# Patient Record
Sex: Female | Born: 1955 | Race: White | Hispanic: No | Marital: Single | Smoking: Current every day smoker
Health system: Southern US, Community
[De-identification: ages and names within clinical notes are randomized; demographics above are authoritative.]

## PROBLEM LIST (undated history)

## (undated) DIAGNOSIS — K7460 Unspecified cirrhosis of liver: Secondary | ICD-10-CM

## (undated) DIAGNOSIS — I10 Essential (primary) hypertension: Secondary | ICD-10-CM

## (undated) DIAGNOSIS — J219 Acute bronchiolitis, unspecified: Secondary | ICD-10-CM

## (undated) DIAGNOSIS — J449 Chronic obstructive pulmonary disease, unspecified: Secondary | ICD-10-CM

## (undated) DIAGNOSIS — K7030 Alcoholic cirrhosis of liver without ascites: Secondary | ICD-10-CM

## (undated) DIAGNOSIS — J45909 Unspecified asthma, uncomplicated: Secondary | ICD-10-CM

## (undated) HISTORY — DX: Chronic obstructive pulmonary disease, unspecified: J44.9

## (undated) HISTORY — PX: APPENDECTOMY: SHX54

## (undated) HISTORY — DX: Unspecified cirrhosis of liver: K74.60

## (undated) HISTORY — DX: Essential (primary) hypertension: I10

## (undated) HISTORY — PX: ABDOMINAL HYSTERECTOMY: SHX81

## (undated) HISTORY — PX: BACK SURGERY: SHX140

---

## 2001-05-21 ENCOUNTER — Encounter: Admission: RE | Admit: 2001-05-21 | Discharge: 2001-05-21 | Payer: Self-pay | Admitting: Family Medicine

## 2001-05-21 ENCOUNTER — Encounter: Payer: Self-pay | Admitting: Family Medicine

## 2004-02-13 ENCOUNTER — Emergency Department (HOSPITAL_COMMUNITY): Admission: EM | Admit: 2004-02-13 | Discharge: 2004-02-13 | Payer: Self-pay | Admitting: Emergency Medicine

## 2008-09-16 ENCOUNTER — Emergency Department (HOSPITAL_COMMUNITY): Admission: EM | Admit: 2008-09-16 | Discharge: 2008-09-16 | Payer: Self-pay | Admitting: Emergency Medicine

## 2009-11-28 ENCOUNTER — Emergency Department (HOSPITAL_COMMUNITY): Admission: EM | Admit: 2009-11-28 | Discharge: 2009-11-28 | Payer: Self-pay | Admitting: Emergency Medicine

## 2010-02-12 ENCOUNTER — Emergency Department (HOSPITAL_COMMUNITY): Admission: EM | Admit: 2010-02-12 | Discharge: 2010-02-12 | Payer: Self-pay | Admitting: Emergency Medicine

## 2010-12-29 LAB — RAPID STREP SCREEN (MED CTR MEBANE ONLY): Streptococcus, Group A Screen (Direct): NEGATIVE

## 2011-07-13 LAB — POCT I-STAT, CHEM 8
BUN: 14 mg/dL (ref 6–23)
Calcium, Ion: 1.11 mmol/L — ABNORMAL LOW (ref 1.12–1.32)
Chloride: 112 mEq/L (ref 96–112)
Creatinine, Ser: 0.9 mg/dL (ref 0.4–1.2)
Glucose, Bld: 103 mg/dL — ABNORMAL HIGH (ref 70–99)
HCT: 43 % (ref 36.0–46.0)
Hemoglobin: 14.6 g/dL (ref 12.0–15.0)
Potassium: 4 mEq/L (ref 3.5–5.1)
Sodium: 139 mEq/L (ref 135–145)
TCO2: 21 mmol/L (ref 0–100)

## 2011-07-13 LAB — POCT CARDIAC MARKERS
CKMB, poc: 1.1 ng/mL (ref 1.0–8.0)
Myoglobin, poc: 45.2 ng/mL (ref 12–200)
Troponin i, poc: <0.05 ng/mL (ref 0.00–0.09)

## 2013-12-30 ENCOUNTER — Emergency Department (HOSPITAL_COMMUNITY): Payer: Self-pay

## 2013-12-30 ENCOUNTER — Encounter (HOSPITAL_COMMUNITY): Payer: Self-pay | Admitting: Emergency Medicine

## 2013-12-30 ENCOUNTER — Inpatient Hospital Stay (HOSPITAL_COMMUNITY)
Admission: EM | Admit: 2013-12-30 | Discharge: 2014-01-02 | DRG: 202 | Disposition: A | Discharge status: Home / Self Care | Payer: Self-pay | Attending: Internal Medicine | Admitting: Internal Medicine

## 2013-12-30 DIAGNOSIS — Z720 Tobacco use: Secondary | ICD-10-CM

## 2013-12-30 DIAGNOSIS — F17200 Nicotine dependence, unspecified, uncomplicated: Secondary | ICD-10-CM | POA: Diagnosis present

## 2013-12-30 DIAGNOSIS — Z880 Allergy status to penicillin: Secondary | ICD-10-CM

## 2013-12-30 DIAGNOSIS — F17210 Nicotine dependence, cigarettes, uncomplicated: Secondary | ICD-10-CM | POA: Diagnosis present

## 2013-12-30 DIAGNOSIS — J45901 Unspecified asthma with (acute) exacerbation: Principal | ICD-10-CM | POA: Diagnosis present

## 2013-12-30 DIAGNOSIS — J209 Acute bronchitis, unspecified: Secondary | ICD-10-CM | POA: Diagnosis present

## 2013-12-30 DIAGNOSIS — J9601 Acute respiratory failure with hypoxia: Secondary | ICD-10-CM | POA: Diagnosis present

## 2013-12-30 DIAGNOSIS — J40 Bronchitis, not specified as acute or chronic: Secondary | ICD-10-CM

## 2013-12-30 DIAGNOSIS — J9600 Acute respiratory failure, unspecified whether with hypoxia or hypercapnia: Secondary | ICD-10-CM | POA: Diagnosis present

## 2013-12-30 DIAGNOSIS — J441 Chronic obstructive pulmonary disease with (acute) exacerbation: Secondary | ICD-10-CM

## 2013-12-30 HISTORY — DX: Unspecified asthma, uncomplicated: J45.909

## 2013-12-30 HISTORY — DX: Acute bronchiolitis, unspecified: J21.9

## 2013-12-30 LAB — BLOOD GAS, ARTERIAL
Acid-base deficit: 1.3 mmol/L (ref 0.0–2.0)
Bicarbonate: 22.4 mEq/L (ref 20.0–24.0)
DRAWN BY: 235321
O2 CONTENT: 2 L/min
O2 Saturation: 92.5 %
PCO2 ART: 36.4 mmHg (ref 35.0–45.0)
PH ART: 7.405 (ref 7.350–7.450)
Patient temperature: 98.6
TCO2: 19.6 mmol/L (ref 0–100)
pO2, Arterial: 65.3 mmHg — ABNORMAL LOW (ref 80.0–100.0)

## 2013-12-30 LAB — BASIC METABOLIC PANEL
BUN: 13 mg/dL (ref 6–23)
CO2: 24 meq/L (ref 19–32)
CREATININE: 0.67 mg/dL (ref 0.50–1.10)
Calcium: 9.1 mg/dL (ref 8.4–10.5)
Chloride: 103 mEq/L (ref 96–112)
GFR calc Af Amer: >90 mL/min (ref >90)
GFR calc non Af Amer: >90 mL/min (ref >90)
Glucose, Bld: 136 mg/dL — ABNORMAL HIGH (ref 70–99)
Potassium: 3.9 mEq/L (ref 3.7–5.3)
SODIUM: 138 meq/L (ref 137–147)

## 2013-12-30 LAB — I-STAT TROPONIN, ED: Troponin i, poc: 0 ng/mL (ref 0.00–0.08)

## 2013-12-30 LAB — CBC
HEMATOCRIT: 41.1 % (ref 36.0–46.0)
HEMOGLOBIN: 14.1 g/dL (ref 12.0–15.0)
MCH: 29.6 pg (ref 26.0–34.0)
MCHC: 34.3 g/dL (ref 30.0–36.0)
MCV: 86.2 fL (ref 78.0–100.0)
Platelets: 297 10*3/uL (ref 150–400)
RBC: 4.77 MIL/uL (ref 3.87–5.11)
RDW: 13.1 % (ref 11.5–15.5)
WBC: 8.6 10*3/uL (ref 4.0–10.5)

## 2013-12-30 LAB — D-DIMER, QUANTITATIVE (NOT AT ARMC)

## 2013-12-30 MED ORDER — ALBUTEROL SULFATE (2.5 MG/3ML) 0.083% IN NEBU
5.0000 mg | INHALATION_SOLUTION | Freq: Once | RESPIRATORY_TRACT | Status: AC
  Administered 2013-12-30: 5 mg via RESPIRATORY_TRACT
  Filled 2013-12-30: qty 6

## 2013-12-30 MED ORDER — GUAIFENESIN ER 600 MG PO TB12
600.0000 mg | ORAL_TABLET | Freq: Two times a day (BID) | ORAL | Status: DC
  Administered 2013-12-31 – 2014-01-02 (×6): 600 mg via ORAL
  Filled 2013-12-30 (×9): qty 1

## 2013-12-30 MED ORDER — ALBUTEROL SULFATE (2.5 MG/3ML) 0.083% IN NEBU: 5.0000 mg | INHALATION_SOLUTION | RESPIRATORY_TRACT | Status: DC | PRN

## 2013-12-30 MED ORDER — ALBUTEROL SULFATE (2.5 MG/3ML) 0.083% IN NEBU: 2.5000 mg | INHALATION_SOLUTION | RESPIRATORY_TRACT | Status: DC

## 2013-12-30 MED ORDER — PREDNISONE 20 MG PO TABS
60.0000 mg | ORAL_TABLET | Freq: Once | ORAL | Status: AC
  Administered 2013-12-30: 60 mg via ORAL
  Filled 2013-12-30: qty 3

## 2013-12-30 MED ORDER — METHYLPREDNISOLONE SODIUM SUCC 125 MG IJ SOLR
60.0000 mg | Freq: Two times a day (BID) | INTRAMUSCULAR | Status: DC
  Administered 2013-12-31 – 2014-01-01 (×5): 60 mg via INTRAVENOUS
  Filled 2013-12-30 (×7): qty 0.96

## 2013-12-30 MED ORDER — DEXTROSE 5 % IV SOLN
500.0000 mg | Freq: Every day | INTRAVENOUS | Status: DC
  Administered 2013-12-31 – 2014-01-01 (×3): 500 mg via INTRAVENOUS
  Filled 2013-12-30 (×3): qty 500

## 2013-12-30 MED ORDER — ACETAMINOPHEN 325 MG PO TABS: 650.0000 mg | ORAL_TABLET | Freq: Four times a day (QID) | ORAL | Status: DC | PRN

## 2013-12-30 MED ORDER — IPRATROPIUM BROMIDE 0.02 % IN SOLN: 0.5000 mg | RESPIRATORY_TRACT | Status: DC

## 2013-12-30 MED ORDER — IPRATROPIUM-ALBUTEROL 0.5-2.5 (3) MG/3ML IN SOLN
RESPIRATORY_TRACT | Status: AC
  Filled 2013-12-30: qty 3

## 2013-12-30 MED ORDER — DOXYCYCLINE HYCLATE 100 MG PO TABS
100.0000 mg | ORAL_TABLET | Freq: Once | ORAL | Status: AC
  Administered 2013-12-30: 100 mg via ORAL
  Filled 2013-12-30: qty 1

## 2013-12-30 MED ORDER — DEXTROSE 5 % IV SOLN
1.0000 g | Freq: Every day | INTRAVENOUS | Status: DC
  Administered 2013-12-31 – 2014-01-01 (×3): 1 g via INTRAVENOUS
  Filled 2013-12-30 (×3): qty 10

## 2013-12-30 MED ORDER — ACETAMINOPHEN 650 MG RE SUPP: 650.0000 mg | Freq: Four times a day (QID) | RECTAL | Status: DC | PRN

## 2013-12-30 MED ORDER — ONDANSETRON HCL 4 MG PO TABS: 4.0000 mg | ORAL_TABLET | Freq: Four times a day (QID) | ORAL | Status: DC | PRN

## 2013-12-30 MED ORDER — ONDANSETRON HCL 4 MG/2ML IJ SOLN: 4.0000 mg | Freq: Four times a day (QID) | INTRAMUSCULAR | Status: DC | PRN

## 2013-12-30 MED ORDER — ENOXAPARIN SODIUM 40 MG/0.4ML ~~LOC~~ SOLN
40.0000 mg | Freq: Every day | SUBCUTANEOUS | Status: DC
  Administered 2013-12-31 – 2014-01-01 (×3): 40 mg via SUBCUTANEOUS
  Filled 2013-12-30 (×4): qty 0.4

## 2013-12-30 NOTE — Progress Notes (Signed)
   CARE MANAGEMENT ED NOTE 12/30/2013  Patient:  Angel [name],Angel [name]   Account Number:  [id number]  Date Initiated:  12/30/2013  Documentation initiated by:  Radford PaxFERRERO,Anadalay Macdonell  Subjective/Objective Assessment:   Patient presents to Ed with cough and shortness of breath.     Subjective/Objective Assessment Detail:   Patient has history of asthma.     Action/Plan:   Action/Plan Detail:   Anticipated DC Date:       Status Recommendation to Physician:   Result of Recommendation:    Other ED Services  Consult Working Plan    DC Planning Services  Other  PCP issues    Choice offered to / List presented to:            Status of service:  Completed, signed off  ED Comments:   ED Comments Detail:  EDCM spoke to patient at bedside.  Patient reports she would consider her pcp to be the [facility] on [place]. Patient reports she goes there when she needs too.  Patient reports she does not have insurance.  [facility] rep saw patient earlier today and provided patient with information regarding the orange card.  [facility] provided patient with list of pcps who accept self pay patients, phone number to inquire about Medicaid insurance, list of fiancial resources in the community such as local churches and [employer], urban ministries, dental assistance for uninsured patients, discounted pharmacies and websites needymeds.org anf Good https://figueroa.info/X.com for medication assistance. [facility] provided patient with a RX discount card, and coupon for venolin inhaler from good https://figueroa.info/X.com.  Patient reports she has difficulty affording the ventolin inhaler.  [facility] encouraged patient to quit smoking.  Patient reports hse has been trying too.  [facility]  Patient thankful for resources.  No further EDCM needs at this time.

## 2013-12-30 NOTE — ED Notes (Signed)
Called to give report to 3rd floor, nurse states she will call back.

## 2013-12-30 NOTE — ED Notes (Signed)
After 3 breathing treatments, pt continues to have expiratory wheezing. O2 still around 90-91% on 2 L [place]

## 2013-12-30 NOTE — ED Notes (Signed)
Bed: WA07 Expected date:  Expected time:  Means of arrival:  Comments: 

## 2013-12-30 NOTE — H&P (Signed)
[facility] History and Physical  Patient: Angel [name]  RUE:[id number]  DOB: 02/21/1956  DOS: the patient was seen and examined on 12/30/2013 PCP: No primary provider on file.  Chief Complaint: Shortness of breath  HPI: Angel [name] is a 58 y.o. female with Past medical history of asthma. The patient is coming from home. The patient presented with complaints of shortness of breath that has been progressively worsening since last one and a half week. This is associated with cough since last few days with clear white sputum without any blood. She denies any fever but had some chills. She started having some chest tightness today. Her shortness of breath was progressively worsened to the extent that she was short of breath even at rest today therefore she came to the hospital. She has been actively smoking few cigarettes a daily basis. Denies any significant alcohol abuse. She mentions she has history of asthma and has inhaler and she has been out of them few days ago. She has history of penicillin allergy but has tolerated Keflex and azithromycin.  Review of Systems: as mentioned in the history of present illness.  A Comprehensive review of the other systems is negative.  Past Medical History  Diagnosis Date  . Asthma   . Bronchiolitis    Past Surgical History  Procedure Laterality Date  . Abdominal hysterectomy    . Appendectomy     Social History:  reports that she has been smoking.  She does not have any smokeless tobacco history on file. She reports that she does not drink alcohol or use illicit drugs. Independent for most of her  ADL.  Allergies  Allergen Reactions  . Penicillins Hives    No family history on file.  Prior to Admission medications   Medication Sig Start Date End Date Taking? Authorizing Provider  albuterol (PROVENTIL HFA;VENTOLIN HFA) 108 (90 BASE) MCG/ACT inhaler Inhale 1-2 puffs into the lungs every 6 (six) hours as needed for wheezing or  shortness of breath.   Yes Historical Provider, MD    Physical Exam: Filed Vitals:   12/30/13 1635 12/30/13 1707 12/30/13 1735 12/30/13 1854  BP:    106/66  Pulse:  96 85   Temp:      TempSrc:      Resp:   16 18  SpO2: 90% 88% 92% 92%    General: Alert, Awake and Oriented to Time, Place and Person. Appear in mild distress Eyes: PERRL ENT: Oral Mucosa clear moist. Neck: no JVD Cardiovascular: S1 and S2 Present, no Murmur, Peripheral Pulses Present Respiratory: Bilateral Air entry equal and Decreased, no Crackles, extensive expiratory wheezes Abdomen: Bowel Sound Present, Soft and Non tender Skin: No Rash Extremities: No Pedal edema, no calf tenderness Neurologic: Grossly Unremarkable.  Labs on Admission:  CBC:  Recent Labs Lab 12/30/13 1315  WBC 8.6  HGB 14.1  HCT 41.1  MCV 86.2  PLT 297    CMP     Component Value Date/Time   NA 138 12/30/2013 1315   K 3.9 12/30/2013 1315   CL 103 12/30/2013 1315   CO2 24 12/30/2013 1315   GLUCOSE 136* 12/30/2013 1315   BUN 13 12/30/2013 1315   CREATININE 0.67 12/30/2013 1315   CALCIUM 9.1 12/30/2013 1315   GFRNONAA >90 12/30/2013 1315   GFRAA >90 12/30/2013 1315    Radiological Exams on Admission: Dg Chest Port 1 View  12/30/2013   CLINICAL DATA:  Shortness of breath, asthma  EXAM: PORTABLE CHEST - 1  VIEW  COMPARISON:  11/28/2009  FINDINGS: Cardiomediastinal silhouette is stable. No acute infiltrate or pleural effusion. No pulmonary edema. Bony thorax is unremarkable.  IMPRESSION: No active disease.   Electronically Signed   By: Natasha MeadLiviu  Pop M.D.   On: 12/30/2013 13:32    EKG: Independently reviewed. normal sinus rhythm.  Assessment/Plan Principal Problem:   Asthma exacerbation   1. Asthma exacerbation The patient is presenting with complaints of shortness of breath is progressively worsening associated with cough. She has extensive expiratory wheezing in the hospital and at the time of my evaluation and requiring 2 L of oxygen  to maintain adequate saturation. With this she will be admitted to the hospital for asthma exacerbation. She'll be started on IV steroids Solu-Medrol 60 mg every 12 hours, IV ceftriaxone and IV azithromycin, duo nebs every 4 hours and when necessary. I will obtain sputum culture and influenza PCR Mostly she has a rescue inhaler, may need regular scheduled asthma treatment at the time of discharge  2. History of smoking Along with asthma patient may also have COPD. Need workup as an outpatient and establish with a physician for primary care.  DVT Prophylaxis: subcutaneous Heparin Nutrition: Regular diet  Code Status: Full  Disposition: Admitted to inpatient in med-surge unit.  Author: Lynden OxfordPranav Traven Davids, MD [facility] Pager: [phone] 12/30/2013, 8:18 PM    If 7PM-7AM, please contact night-coverage [email or web address] Password [email or web address]

## 2013-12-30 NOTE — ED Provider Notes (Signed)
CSN: [id number]     Arrival date & time 12/30/13  1213 History   First MD Initiated Contact with Patient 12/30/13 1259     Chief Complaint  Patient presents with  . Asthma  . chest congestion   . Shortness of Breath     (Consider location/radiation/quality/duration/timing/severity/associated sxs/prior Treatment) HPI Comments: Patient presents with cough and shortness of breath. She states the symptoms started about a week and half ago but it got worse over the last 5 days. She does have a history of asthma and feels like her wheezing and shortness of breath has worsened over the last 2-3 days. She ran out of her albuterol inhaler yesterday. She denies a known fevers. She is sore across the chest The right thigh. It's worse with coughing and worse with breathing. She denies any leg pain or swelling. There is no history of DVTs. She does smoke daily.  Patient is a 58 y.o. female presenting with asthma and shortness of breath.  Asthma Associated symptoms include chest pain and shortness of breath. Pertinent negatives include no abdominal pain and no headaches.  Shortness of Breath Associated symptoms: chest pain, cough and wheezing   Associated symptoms: no abdominal pain, no diaphoresis, no fever, no headaches, no rash and no vomiting     Past Medical History  Diagnosis Date  . Asthma   . Bronchiolitis    Past Surgical History  Procedure Laterality Date  . Abdominal hysterectomy    . Appendectomy     No family history on file. History  Substance Use Topics  . Smoking status: Current Every Day Smoker  . Smokeless tobacco: Not on file  . Alcohol Use: No   OB History   Grav Para Term Preterm Abortions TAB SAB Ect Mult Living                 Review of Systems  Constitutional: Positive for fatigue. Negative for fever, chills and diaphoresis.  HENT: Negative for congestion, rhinorrhea and sneezing.   Eyes: Negative.   Respiratory: Positive for cough, shortness of breath and  wheezing. Negative for chest tightness.   Cardiovascular: Positive for chest pain. Negative for leg swelling.  Gastrointestinal: Negative for nausea, vomiting, abdominal pain, diarrhea and blood in stool.  Genitourinary: Negative for frequency, hematuria, flank pain and difficulty urinating.  Musculoskeletal: Negative for arthralgias and back pain.  Skin: Negative for rash.  Neurological: Negative for dizziness, speech difficulty, weakness, numbness and headaches.      Allergies  Penicillins  Home Medications   Current Outpatient Rx  Name  Route  Sig  Dispense  Refill  . albuterol (PROVENTIL HFA;VENTOLIN HFA) 108 (90 BASE) MCG/ACT inhaler   Inhalation   Inhale 1-2 puffs into the lungs every 6 (six) hours as needed for wheezing or shortness of breath.          BP 114/83  Pulse 80  Temp(Src) 98 F (36.7 C) (Oral)  Resp 25  SpO2 90% Physical Exam  Constitutional: She is oriented to person, place, and time. She appears well-developed and well-nourished. She appears distressed.  HENT:  Head: Normocephalic and atraumatic.  Eyes: Pupils are equal, round, and reactive to light.  Neck: Normal range of motion. Neck supple.  Cardiovascular: Normal rate, regular rhythm and normal heart sounds.   Pulmonary/Chest: She is in respiratory distress. She has wheezes. She has no rales. She exhibits no tenderness.  Patient has wheezing with diminished breath sounds bilaterally. She has increased work of breathing.  Abdominal: Soft.  Bowel sounds are normal. There is no tenderness. There is no rebound and no guarding.  Musculoskeletal: Normal range of motion. She exhibits no edema.  Lymphadenopathy:    She has no cervical adenopathy.  Neurological: She is alert and oriented to person, place, and time.  Skin: Skin is warm and dry. No rash noted.  Psychiatric: She has a normal mood and affect.    ED Course  Procedures (including critical care time) Labs Review Results for orders placed  during the hospital encounter of 12/30/13  BASIC METABOLIC PANEL      Result Value Ref Range   Sodium 138  137 - 147 mEq/L   Potassium 3.9  3.7 - 5.3 mEq/L   Chloride 103  96 - 112 mEq/L   CO2 24  19 - 32 mEq/L   Glucose, Bld 136 (*) 70 - 99 mg/dL   BUN 13  6 - 23 mg/dL   Creatinine, Ser 8.65  0.50 - 1.10 mg/dL   Calcium 9.1  8.4 - 78.4 mg/dL   GFR calc non Af Amer >90  >90 mL/min   GFR calc Af Amer >90  >90 mL/min  CBC      Result Value Ref Range   WBC 8.6  4.0 - 10.5 K/uL   RBC 4.77  3.87 - 5.11 MIL/uL   Hemoglobin 14.1  12.0 - 15.0 g/dL   HCT 69.6  29.5 - 28.4 %   MCV 86.2  78.0 - 100.0 fL   MCH 29.6  26.0 - 34.0 pg   MCHC 34.3  30.0 - 36.0 g/dL   RDW 13.2  44.0 - 10.2 %   Platelets 297  150 - 400 K/uL  I-STAT TROPOININ, ED      Result Value Ref Range   Troponin i, poc 0.00  0.00 - 0.08 ng/mL   Comment 3            Dg Chest Port 1 View  12/30/2013   CLINICAL DATA:  Shortness of breath, asthma  EXAM: PORTABLE CHEST - 1 VIEW  COMPARISON:  11/28/2009  FINDINGS: Cardiomediastinal silhouette is stable. No acute infiltrate or pleural effusion. No pulmonary edema. Bony thorax is unremarkable.  IMPRESSION: No active disease.   Electronically Signed   By: Natasha Mead M.D.   On: 12/30/2013 13:32    Imaging Review Dg Chest Port 1 View  12/30/2013   CLINICAL DATA:  Shortness of breath, asthma  EXAM: PORTABLE CHEST - 1 VIEW  COMPARISON:  11/28/2009  FINDINGS: Cardiomediastinal silhouette is stable. No acute infiltrate or pleural effusion. No pulmonary edema. Bony thorax is unremarkable.  IMPRESSION: No active disease.   Electronically Signed   By: Natasha Mead M.D.   On: 12/30/2013 13:32     EKG Interpretation None      MDM   Final diagnoses:  None    Patient is hypoxic on arrival. On nasal cannula she has stopped in the mid 90s. She was given 2 nebulizer treatments this improved after that. She feels like her breathing is better and she currently has no chest pain. She was having  some pleuritic pain on the right but says that is completely gone away after the breathing treatment. She has no pedal edema or calf tenderness that would be more suggestive of PE/DVT.  She still has low sats off oxygen down to 88%.  She wants to try another treatment prior to being admitted.  I will try one more neb and check d-dimer.  She was  given steriods.  Care turned over to Dr. Devoria Albe.    Rolan Bucco, MD 12/30/13 [phone]

## 2013-12-30 NOTE — ED Notes (Signed)
Pt c/o asthma flare up, chest congestion x 5 days. States last night got worse. States it gets worse when she lays down. NAD but o2 sat lowest 89.

## 2013-12-30 NOTE — Progress Notes (Signed)
[facility] CL provided pt with a list of primary care resources and [facility] [employer] [facility] application. Patient stated that she had [facility] information for [facility], but had never heard back. Provided pt with [facility] information.

## 2013-12-30 NOTE — ED Provider Notes (Signed)
16:45 Pt left at change of shift. Patient reports she has a history of asthma however she does not have a physician. She has just received her third nebulizer [facility] ED. During the course of our conversation her pulse ox [facility] however it did bump up briefly to 94. On exam patient still has some diffuse wheezing and rare rhonchi. She does not have retractions. She had been reluctant to be admitted however now she is considering that that would be what is needed. I am going to have nursing staff ambulate her and we've discussed of her oxygen gets in the 80s she would require admission.  Nurses report when patient ambulated her pulse ox dropped into the high 80s.  17:39 Dr Thedore MinsSingh states he wants the ddimer back before he admits this patient.  [id number]  18:50 D-dimer < 0.27, called flow manager to talk to Dr Thedore MinsSingh again, Dr Thedore MinsSingh is gone, will have the evening doctor call me back  19:21 Dr Allena KatzPatel, admit to med-surg, team 8, requests influenza test and ABG   EKG Interpretation  Date/Time:  Wednesday December 30 2013 12:33:25 EDT Ventricular Rate:  90 PR Interval:  163 QRS Duration: 95 QT Interval:  376 QTC Calculation: 460 R Axis:   78 Text Interpretation:  Sinus rhythm Low voltage, precordial leads No significant change since last tracing Confirmed by Jasimine Simms  MD-I, Kelley Polinsky ([id number]) on 12/30/2013 5:20:33 PM      Plan admission  Devoria AlbeIva Jamarian Jacinto, MD, Franz DellFACEP   Michal Strzelecki L Rayana Geurin, MD 12/30/13 2125

## 2013-12-30 NOTE — ED Notes (Signed)
RT at bedside.

## 2013-12-31 DIAGNOSIS — J9601 Acute respiratory failure with hypoxia: Secondary | ICD-10-CM | POA: Diagnosis present

## 2013-12-31 DIAGNOSIS — J9600 Acute respiratory failure, unspecified whether with hypoxia or hypercapnia: Secondary | ICD-10-CM

## 2013-12-31 DIAGNOSIS — F17210 Nicotine dependence, cigarettes, uncomplicated: Secondary | ICD-10-CM | POA: Diagnosis present

## 2013-12-31 DIAGNOSIS — F17200 Nicotine dependence, unspecified, uncomplicated: Secondary | ICD-10-CM | POA: Diagnosis present

## 2013-12-31 HISTORY — DX: Nicotine dependence, unspecified, uncomplicated: F17.200

## 2013-12-31 LAB — COMPREHENSIVE METABOLIC PANEL
ALBUMIN: 3.6 g/dL (ref 3.5–5.2)
ALK PHOS: 106 U/L (ref 39–117)
ALT: 21 U/L (ref 0–35)
AST: 17 U/L (ref 0–37)
BUN: 12 mg/dL (ref 6–23)
CO2: 21 mEq/L (ref 19–32)
Calcium: 9.2 mg/dL (ref 8.4–10.5)
Chloride: 99 mEq/L (ref 96–112)
Creatinine, Ser: 0.63 mg/dL (ref 0.50–1.10)
GFR calc Af Amer: >90 mL/min (ref >90)
GFR calc non Af Amer: >90 mL/min (ref >90)
Glucose, Bld: 141 mg/dL — ABNORMAL HIGH (ref 70–99)
POTASSIUM: 4.3 meq/L (ref 3.7–5.3)
Sodium: 137 mEq/L (ref 137–147)
TOTAL PROTEIN: 7 g/dL (ref 6.0–8.3)
Total Bilirubin: <0.2 mg/dL — ABNORMAL LOW (ref 0.3–1.2)

## 2013-12-31 LAB — EXPECTORATED SPUTUM ASSESSMENT W REFEX TO RESP CULTURE

## 2013-12-31 LAB — CBC
HEMATOCRIT: 41.4 % (ref 36.0–46.0)
HEMOGLOBIN: 14 g/dL (ref 12.0–15.0)
MCH: 29.3 pg (ref 26.0–34.0)
MCHC: 33.8 g/dL (ref 30.0–36.0)
MCV: 86.6 fL (ref 78.0–100.0)
Platelets: 299 10*3/uL (ref 150–400)
RBC: 4.78 MIL/uL (ref 3.87–5.11)
RDW: 13.3 % (ref 11.5–15.5)
WBC: 13.3 10*3/uL — AB (ref 4.0–10.5)

## 2013-12-31 LAB — EXPECTORATED SPUTUM ASSESSMENT W GRAM STAIN, RFLX TO RESP C

## 2013-12-31 MED ORDER — ZOLPIDEM TARTRATE 5 MG PO TABS
5.0000 mg | ORAL_TABLET | Freq: Once | ORAL | Status: AC
  Administered 2013-12-31: 5 mg via ORAL
  Filled 2013-12-31: qty 1

## 2013-12-31 MED ORDER — PNEUMOCOCCAL VAC POLYVALENT 25 MCG/0.5ML IJ INJ: 0.5000 mL | INJECTION | INTRAMUSCULAR | Status: DC

## 2013-12-31 MED ORDER — PNEUMOCOCCAL VAC POLYVALENT 25 MCG/0.5ML IJ INJ
0.5000 mL | INJECTION | Freq: Once | INTRAMUSCULAR | Status: AC
  Administered 2013-12-31: 0.5 mL via INTRAMUSCULAR
  Filled 2013-12-31: qty 0.5

## 2013-12-31 MED ORDER — NICOTINE 7 MG/24HR TD PT24
7.0000 mg | MEDICATED_PATCH | Freq: Every day | TRANSDERMAL | Status: DC
  Administered 2013-12-31 – 2014-01-02 (×3): 7 mg via TRANSDERMAL
  Filled 2013-12-31 (×3): qty 1

## 2013-12-31 MED ORDER — IPRATROPIUM-ALBUTEROL 0.5-2.5 (3) MG/3ML IN SOLN
3.0000 mL | Freq: Four times a day (QID) | RESPIRATORY_TRACT | Status: DC
  Administered 2013-12-31 – 2014-01-01 (×8): 3 mL via RESPIRATORY_TRACT
  Filled 2013-12-31 (×7): qty 3

## 2013-12-31 MED ORDER — ALBUTEROL SULFATE (2.5 MG/3ML) 0.083% IN NEBU: 5.0000 mg | INHALATION_SOLUTION | RESPIRATORY_TRACT | Status: DC | PRN

## 2013-12-31 NOTE — Progress Notes (Addendum)
CSW received referral that pt had no insurance and needed assistance with medications.  CSW reviewed chart and noted that RNCM spoke with pt at bedside. Financial Counselor consulted. RNCM will be assisting with medications.  No social work needs identified at this time.  CSW signing off.   Please re-consult if social work needs arise.  Loletta SpecterSuzanna Kidd, MSW, LCSW Clinical Social Work [phone]

## 2013-12-31 NOTE — Progress Notes (Signed)
Patient requested prn medication for sleep. Paged NP on call. Orders received for ambien x1 dose.

## 2013-12-31 NOTE — Progress Notes (Signed)
[facility].

## 2013-12-31 NOTE — Progress Notes (Signed)
Patient admitted to room 1312 from ED. On oxygen at 2l [place]. Oriented to room and unit. Placed on droplet precautions pending Influenza PCR done in ED.

## 2013-12-31 NOTE — Care Management Note (Signed)
Cm spoke with patient at bedside concerning Cm consult for Medication Assistance. Per pt currently unemployed. Pt resides with family. Per pt in process of applying for [employer] Insurance. Financial Counselor to consult. Pt provided with information concerning [facility] and other community resources. Pt qualifies for [facility] program. Pt instructed to have RN contact Cm day of discharge for [facility].    Roxy Mannsymeeka Stefany Starace,MSN,RN [phone]

## 2013-12-31 NOTE — Progress Notes (Signed)
PROGRESS NOTE   Angel [name] ZOX:[id number] DOB: 09/02/1956 DOA: 12/30/2013 PCP: No primary provider on file.  Brief narrative: Angel [name] is an 58 y.o. female with a PMH of asthma and ongoing tobacco abuse who was admitted 12/30/13 with an asthma exacerbation.  Assessment/Plan: Principal Problem:   Asthma exacerbation with acute hypoxic respiratory failure Continue nebulized bronchodilator therapy, empiric antibiotics with Rocephin/azithromycin, Mucinex, and Solu-Medrol. Continue supplemental oxygen as needed. Followup sputum cultures and respiratory virus panel. Active Problems:   Tobacco abuse Counseled. Will start a nicotine patch.   DVT Prophylaxis Continue Lovenox.  Code Status: Full. Family Communication: No family at the bedside. Disposition Plan: Home when stable.   IV access:  Peripheral IV  Medical Consultants:  None  Other Consultants:  None  Anti-infectives:  Rocephin 12/30/13--->  Azithromycin 12/30/13--->  HPI/Subjective: Angel [name] feels a little bit better but still feels weak and has a frequent cough chest discomfort. A little less dyspneic, but still feels like she needs the oxygen.  Objective: Filed Vitals:   12/30/13 2215 12/30/13 2235 12/31/13 0600 12/31/13 0700  BP: 143/76  139/83   Pulse: 84 88 86   Temp: 97.9 F (36.6 C)  97.8 F (36.6 C)   TempSrc: Oral  Oral   Resp: 16  20   Height: 5\' 6"  (1.676 m)     Weight: 69.1 kg (152 lb 5.4 oz)     SpO2: 93%  94% 95%    Intake/Output Summary (Last 24 hours) at 12/31/13 1340 Last data filed at 12/31/13 0954  Gross per 24 hour  Intake    848 ml  Output    200 ml  Net    648 ml    Exam: Gen:  NAD Cardiovascular:  RRR, No M/R/G Respiratory:  Lungs with coarse expiratory wheezes and rhonchi Gastrointestinal:  Abdomen soft, NT/ND, + BS Extremities:  No C/E/C  Data Reviewed: Basic Metabolic Panel:  Recent Labs Lab 12/30/13 1315 12/31/13 0315  NA 138 137  K 3.9 4.3    CL 103 99  CO2 24 21  GLUCOSE 136* 141*  BUN 13 12  CREATININE 0.67 0.63  CALCIUM 9.1 9.2   GFR Estimated Creatinine Clearance: 74.4 ml/min (by C-G formula based on Cr of 0.63). Liver Function Tests:  Recent Labs Lab 12/31/13 0315  AST 17  ALT 21  ALKPHOS 106  BILITOT <0.2*  PROT 7.0  ALBUMIN 3.6   CBC:  Recent Labs Lab 12/30/13 1315 12/31/13 0315  WBC 8.6 13.3*  HGB 14.1 14.0  HCT 41.1 41.4  MCV 86.2 86.6  PLT 297 299   D-Dimer  Recent Labs  12/30/13 1635  DDIMER <0.27   Microbiology Recent Results (from the past 240 hour(s))  CULTURE, EXPECTORATED SPUTUM-ASSESSMENT     Status: None   Collection Time    12/31/13  6:08 AM      Result Value Ref Range Status   Specimen Description SPUTUM   Final   Special Requests NONE   Final   Sputum evaluation     Final   Value: THIS SPECIMEN IS ACCEPTABLE. RESPIRATORY CULTURE REPORT TO FOLLOW.   Report Status 12/31/2013 FINAL   Final  CULTURE, RESPIRATORY (NON-EXPECTORATED)     Status: None   Collection Time    12/31/13  6:08 AM      Result Value Ref Range Status   Specimen Description SPUTUM   Final   Special Requests NONE   Final   Gram Stain  Final   Value: MODERATE WBC PRESENT,BOTH PMN AND MONONUCLEAR     NO SQUAMOUS EPITHELIAL CELLS SEEN     MODERATE GRAM POSITIVE COCCI IN PAIRS     MODERATE GRAM POSITIVE RODS     Performed at [employer] [facility]   Culture PENDING   Incomplete   Report Status PENDING   Incomplete     Procedures and Diagnostic Studies: Dg Chest Port 1 View  12/30/2013   CLINICAL DATA:  Shortness of breath, asthma  EXAM: PORTABLE CHEST - 1 VIEW  COMPARISON:  11/28/2009  FINDINGS: Cardiomediastinal silhouette is stable. No acute infiltrate or pleural effusion. No pulmonary edema. Bony thorax is unremarkable.  IMPRESSION: No active disease.   Electronically Signed   By: Natasha Mead M.D.   On: 12/30/2013 13:32    Scheduled Meds: . azithromycin  500 mg Intravenous QHS  . cefTRIAXone  (ROCEPHIN)  IV  1 g Intravenous QHS  . enoxaparin (LOVENOX) injection  40 mg Subcutaneous QHS  . guaiFENesin  600 mg Oral BID  . ipratropium-albuterol  3 mL Nebulization Q6H WA  . methylPREDNISolone (SOLU-MEDROL) injection  60 mg Intravenous BID   Continuous Infusions:   Time spent: 25 minutes.   LOS: 1 day   Geanie Pacifico  [facility] Pager [phone]. If unable to reach me by pager, please call my cell phone at [phone].  *Please note that the hospitalists switch teams on Wednesdays. Please call the flow manager at [phone] if you are having difficulty reaching the hospitalist taking care of this patient as she can update you and provide the most up-to-date pager number of provider caring for the patient. If 8PM-8AM, please contact night-coverage at [email or web address], password [facility]  12/31/2013, 1:40 PM    **Disclaimer: This note was dictated with voice recognition software. Similar sounding words can inadvertently be transcribed and this note may contain transcription errors which may not have been corrected upon publication of note.**      In an effort to keep you and your family informed about your hospital stay, I am providing you with this information sheet. If you or your family have any questions, please do not hesitate to have the nursing staff page me to set up a meeting time.  Angel [name] 12/31/2013 1 (Number of days in the hospital)  Treatment team:  Dr. Hillery Aldo, Hospitalist (Internist)   Active Treatment Issues with Plan: Principal Problem:   Asthma exacerbation Continue nebulized bronchodilator therapy (to open up your airways), empiric antibiotics with Rocephin/azithromycin (treat any underlying infection), Mucinex (to break up thick mucus), and Solu-Medrol (a steroid to reduce lung inflammation). Active Problems:   Tobacco abuse We strongly encourage you to quit smoking which can cause irreversible damage to your lungs and puts you at risk for  lung cancer.  Blood clot prevention Lovenox is given under the skin once a day for blood clot prevention.  Anticipated discharge date: Depends on progress, typically folks with an asthma flare are in the hospital 2-4 days.

## 2013-12-31 NOTE — Progress Notes (Signed)
Patient reports that had "wave of nausea " while zithromax infusing. Declined need for antiemetic.

## 2014-01-01 DIAGNOSIS — J40 Bronchitis, not specified as acute or chronic: Secondary | ICD-10-CM

## 2014-01-01 MED ORDER — ZOLPIDEM TARTRATE 5 MG PO TABS
5.0000 mg | ORAL_TABLET | Freq: Every evening | ORAL | Status: DC | PRN
Start: 2014-01-01 — End: 2014-01-02
  Administered 2014-01-01: 5 mg via ORAL
  Filled 2014-01-01: qty 1

## 2014-01-01 MED ORDER — IPRATROPIUM-ALBUTEROL 0.5-2.5 (3) MG/3ML IN SOLN
3.0000 mL | Freq: Four times a day (QID) | RESPIRATORY_TRACT | Status: DC
  Administered 2014-01-02 (×2): 3 mL via RESPIRATORY_TRACT
  Filled 2014-01-01 (×2): qty 3

## 2014-01-01 NOTE — Progress Notes (Signed)
PROGRESS NOTE   Angel Hartorma J Delehanty [id number] DOB: Feb 06, 1956 DOA: 12/30/2013 PCP: No primary provider on file.  Brief narrative: Angel [name] is an 58 y.o. female with a PMH of asthma and ongoing tobacco abuse who was admitted 12/30/13 with an asthma exacerbation.  Assessment/Plan: Principal Problem:   Asthma exacerbation with acute hypoxic respiratory failure Continue nebulized bronchodilator therapy, empiric antibiotics with Rocephin/azithromycin, Mucinex, and Solu-Medrol. Continue supplemental oxygen as needed. Followup sputum cultures and respiratory virus panel. No weaning of steroids yet given ongoing bronchospasm. Active Problems:   Tobacco abuse Counseled. Will start a nicotine patch.   DVT Prophylaxis Continue Lovenox.  Code Status: Full. Family Communication: No family at the bedside. Disposition Plan: Home when stable.   IV access:  Peripheral IV  Medical Consultants:  None  Other Consultants:  None  Anti-infectives:  Rocephin 12/30/13--->  Azithromycin 12/30/13--->  HPI/Subjective: Angel HartNorma J Elsbernd better yesterday morning but then felt worse in the evening with increased weakness and cough. Continues to have some chest discomfort with coughing spells. Appetite is good.  Objective: Filed Vitals:   12/31/13 1953 12/31/13 2212 01/01/14 0547 01/01/14 0652  BP:  116/70 117/74   Pulse:  83 70   Temp:  97.8 F (36.6 C) 97.6 F (36.4 C)   TempSrc:  Oral Oral   Resp:  16 16   Height:      Weight:    69.219 kg (152 lb 9.6 oz)  SpO2: 96% 94% 98%     Intake/Output Summary (Last 24 hours) at 01/01/14 0802 Last data filed at 01/01/14 0757  Gross per 24 hour  Intake    930 ml  Output   1650 ml  Net   -720 ml    Exam: Gen:  NAD Cardiovascular:  RRR, No M/R/G Respiratory:  Lungs with coarse expiratory wheezes and rhonchi, slightly decreased compared to yesterday Gastrointestinal:  Abdomen soft, NT/ND, + BS Extremities:  No C/E/C  Data  Reviewed: Basic Metabolic Panel:  Recent Labs Lab 12/30/13 1315 12/31/13 0315  NA 138 137  K 3.9 4.3  CL 103 99  CO2 24 21  GLUCOSE 136* 141*  BUN 13 12  CREATININE 0.67 0.63  CALCIUM 9.1 9.2   GFR Estimated Creatinine Clearance: 74.4 ml/min (by C-G formula based on Cr of 0.63). Liver Function Tests:  Recent Labs Lab 12/31/13 0315  AST 17  ALT 21  ALKPHOS 106  BILITOT <0.2*  PROT 7.0  ALBUMIN 3.6   CBC:  Recent Labs Lab 12/30/13 1315 12/31/13 0315  WBC 8.6 13.3*  HGB 14.1 14.0  HCT 41.1 41.4  MCV 86.2 86.6  PLT 297 299   D-Dimer  Recent Labs  12/30/13 1635  DDIMER <0.27   Microbiology Recent Results (from the past 240 hour(s))  CULTURE, EXPECTORATED SPUTUM-ASSESSMENT     Status: None   Collection Time    12/31/13  6:08 AM      Result Value Ref Range Status   Specimen Description SPUTUM   Final   Special Requests NONE   Final   Sputum evaluation     Final   Value: THIS SPECIMEN IS ACCEPTABLE. RESPIRATORY CULTURE REPORT TO FOLLOW.   Report Status 12/31/2013 FINAL   Final  CULTURE, RESPIRATORY (NON-EXPECTORATED)     Status: None   Collection Time    12/31/13  6:08 AM      Result Value Ref Range Status   Specimen Description SPUTUM   Final   Special Requests NONE  Final   Gram Stain     Final   Value: MODERATE WBC PRESENT,BOTH PMN AND MONONUCLEAR     NO SQUAMOUS EPITHELIAL CELLS SEEN     MODERATE GRAM POSITIVE COCCI IN PAIRS     MODERATE GRAM POSITIVE RODS     Performed at [facility]   Culture PENDING   Incomplete   Report Status PENDING   Incomplete     Procedures and Diagnostic Studies: Dg Chest Port 1 View  12/30/2013   CLINICAL DATA:  Shortness of breath, asthma  EXAM: PORTABLE CHEST - 1 VIEW  COMPARISON:  11/28/2009  FINDINGS: Cardiomediastinal silhouette is stable. No acute infiltrate or pleural effusion. No pulmonary edema. Bony thorax is unremarkable.  IMPRESSION: No active disease.   Electronically Signed   By: Natasha Mead  M.D.   On: 12/30/2013 13:32    Scheduled Meds: . azithromycin  500 mg Intravenous QHS  . cefTRIAXone (ROCEPHIN)  IV  1 g Intravenous QHS  . enoxaparin (LOVENOX) injection  40 mg Subcutaneous QHS  . guaiFENesin  600 mg Oral BID  . ipratropium-albuterol  3 mL Nebulization Q6H WA  . methylPREDNISolone (SOLU-MEDROL) injection  60 mg Intravenous BID  . nicotine  7 mg Transdermal Daily   Continuous Infusions:   Time spent: 25 minutes.   LOS: 2 days   Li Fragoso  [facility] Pager [phone]. If unable to reach me by pager, please call my cell phone at [phone].  *Please note that the hospitalists switch teams on Wednesdays. Please call the flow manager at [phone] if you are having difficulty reaching the hospitalist taking care of this patient as she can update you and provide the most up-to-date pager number of provider caring for the patient. If 8PM-8AM, please contact night-coverage at [email or web address], password [facility]  01/01/2014, 8:02 AM    **Disclaimer: This note was dictated with voice recognition software. Similar sounding words can inadvertently be transcribed and this note may contain transcription errors which may not have been corrected upon publication of note.**      In an effort to keep you and your family informed about your hospital stay, I am providing you with this information sheet. If you or your family have any questions, please do not hesitate to have the nursing staff page me to set up a meeting time.  NAARA KELTY 01/01/2014 2 (Number of days in the hospital)  Treatment team:  Dr. Hillery Aldo, Hospitalist (Internist)   Active Treatment Issues with Plan: Principal Problem:   Asthma exacerbation Continue nebulized bronchodilator therapy (to open up your airways), empiric antibiotics with Rocephin/azithromycin (treat any underlying infection), Mucinex (to break up thick mucus), and Solu-Medrol (a steroid to reduce lung inflammation). If your  lungs sound better today, I will probably start to reduce your dose of Solu-Medrol. Active Problems:   Tobacco abuse We strongly encourage you to quit smoking which can cause irreversible damage to your lungs and puts you at risk for lung cancer.  Blood clot prevention Lovenox is given under the skin once a day for blood clot prevention.  Anticipated discharge date: Depends on progress, but likely 1-2 more days.

## 2014-01-02 MED ORDER — GUAIFENESIN ER 600 MG PO TB12: 600.0000 mg | ORAL_TABLET | Freq: Two times a day (BID) | ORAL | Status: DC

## 2014-01-02 MED ORDER — AZITHROMYCIN 250 MG PO TABS: ORAL_TABLET | ORAL | Status: DC

## 2014-01-02 MED ORDER — FLUCONAZOLE 150 MG PO TABS: 150.0000 mg | ORAL_TABLET | Freq: Every day | ORAL | Status: DC

## 2014-01-02 MED ORDER — ALBUTEROL SULFATE HFA 108 (90 BASE) MCG/ACT IN AERS: 1.0000 | INHALATION_SPRAY | Freq: Four times a day (QID) | RESPIRATORY_TRACT | Status: DC | PRN

## 2014-01-02 MED ORDER — PREDNISONE (PAK) 10 MG PO TABS: ORAL_TABLET | ORAL | Status: DC

## 2014-01-02 MED ORDER — NICOTINE 7 MG/24HR TD PT24: 7.0000 mg | MEDICATED_PATCH | Freq: Every day | TRANSDERMAL | Status: DC

## 2014-01-02 NOTE — Progress Notes (Signed)
Patient discharged home, all discharge medications and instructions reviewed and questions answered. Case management called for match information. Patient to be assisted to vehicle by wheelchair.

## 2014-01-02 NOTE — Progress Notes (Signed)
   CARE MANAGEMENT NOTE 01/02/2014  Patient:  Angel [name],Angel [name]   Account Number:  [id number]  Date Initiated:  12/31/2013  Documentation initiated by:  DAVIS,TYMEEKA  Subjective/Objective Assessment:   58 yo female admitted with   Asthma exacerbation. No PCP on record.     Action/Plan:   Home when stable   Anticipated DC Date:  01/02/2014   Anticipated DC Plan:  HOME/SELF CARE  In-house referral  Financial Counselor      DC Planning Services  CM consult  PCP issues  MATCH Program      Choice offered to / List presented to:  NA   DME arranged  NA      DME agency  NA     HH arranged  NA      HH agency  NA   Status of service:  Completed, signed off Medicare Important Message given?   (If response is "NO", the following Medicare IM given date fields will be blank) Date Medicare IM given:   Date Additional Medicare IM given:    Discharge Disposition:  HOME/SELF CARE  Per UR Regulation:  Reviewed for med. necessity/level of care/duration of stay  If discussed at Long Length of Stay Meetings, dates discussed:    Comments:  01/02/2014 1030 Provided pt with MATCH letter. Isidoro DonningAlesia Reford Olliff RN CCM Case Mgmt phone [phone]  Sharmon Leydenymeeka Davis, RN Registered Nurse Signed CASE MANAGEMENT Care Management Note Service date: 12/31/2013 10:33 AM Cm spoke with patient at bedside concerning Cm consult for Medication Assistance. Per pt currently unemployed. Pt resides with family. Per pt in process of applying for [employer]. Financial Counselor to consult. Pt provided with information concerning [facility] and other community resources. [facility]. Pt instructed to have RN contact Cm day of discharge for [facility].

## 2014-01-02 NOTE — Discharge Summary (Signed)
Physician Discharge Summary  Angel [name] [id number] DOB: [birth year] DOA: 12/30/2013  PCP: No primary provider on file.  Admit date: 12/30/2013 Discharge date: 01/02/2014  Recommendations for Outpatient Follow-up:  1. [facility]. 2. Followup final sputum cultures and respiratory pathogen panel.  Discharge Diagnoses:  Principal Problem:    Asthma exacerbation Active Problems:    Tobacco abuse    Acute respiratory failure with hypoxia    Acute bronchitis   Discharge Condition: Improved.  Diet recommendation: Regular.  History of present illness:  Angel Hartorma J Munroe is an 58 y.o. female with a PMH of asthma and ongoing tobacco abuse who was admitted 12/30/13 with an asthma exacerbation.  Hospital Course by problem:  Principal Problem:  Asthma exacerbation and acute bronchitis with acute hypoxic respiratory failure  Treated with nebulized bronchodilator therapy, empiric antibiotics with Rocephin/azithromycin, Mucinex, and Solu-Medrol as well as supplemental oxygen as needed. Respiratory status gradually improved during hospitalization. Discharge home on an additional 3 days of therapy with azithromycin and a prednisone taper as well as an albuterol inhaler to use for acute bronchospasm.  Active Problems:  Tobacco abuse  Counseled. Sent home with a nicotine patch. Patient motivated for cessation.  Procedures:  None  Consultations:  None  Discharge Exam: Filed Vitals:   01/02/14 0515  BP: 116/67  Pulse: 84  Temp: 97.5 F (36.4 C)  Resp: 16   Filed Vitals:   01/01/14 1940 01/01/14 2045 01/02/14 0515 01/02/14 0842  BP:  128/79 116/67   Pulse:  82 84   Temp:  97.8 F (36.6 C) 97.5 F (36.4 C)   TempSrc:  Oral Oral   Resp:  16 16   Height:      Weight:   69.582 kg (153 lb 6.4 oz)   SpO2: 94% 96% 96% 98%    Gen:  NAD Cardiovascular:  RRR, No M/R/G Respiratory: Lungs mostly clear with a few  scattered wheezes Gastrointestinal: Abdomen soft, NT/ND with normal active bowel sounds. Extremities: No C/E/C   Discharge Instructions     Medication List         albuterol 108 (90 BASE) MCG/ACT inhaler  Commonly known as:  PROVENTIL HFA;VENTOLIN HFA  Inhale 1-2 puffs into the lungs every 6 (six) hours as needed for wheezing or shortness of breath.     azithromycin 250 MG tablet  Commonly known as:  ZITHROMAX  Take for 3 more days.     fluconazole 150 MG tablet  Commonly known as:  DIFLUCAN  Take 1 tablet (150 mg total) by mouth daily. Take if you develop symptoms of a vaginal yeast infection.     guaiFENesin 600 MG 12 hr tablet  Commonly known as:  MUCINEX  Take 1 tablet (600 mg total) by mouth 2 (two) times daily.     nicotine 7 mg/24hr patch  Commonly known as:  NICODERM CQ - dosed in mg/24 hr  Place 1 patch (7 mg total) onto the skin daily.     predniSONE 10 MG tablet  Commonly known as:  STERAPRED UNI-PAK  Take as directed.           Follow-up Information   Follow up with [facility]    . Schedule an appointment as soon as possible for a visit in 1 week. [facility](Hospital follow up.)    Contact information:   [address] [place] [id number] [phone]  The results of significant diagnostics from this hospitalization (including imaging, microbiology, ancillary and laboratory) are listed below for reference.    Significant Diagnostic Studies: Dg Chest Port 1 View  12/30/2013   CLINICAL DATA:  Shortness of breath, asthma  EXAM: PORTABLE CHEST - 1 VIEW  COMPARISON:  11/28/2009  FINDINGS: Cardiomediastinal silhouette is stable. No acute infiltrate or pleural effusion. No pulmonary edema. Bony thorax is unremarkable.  IMPRESSION: No active disease.   Electronically Signed   By: Natasha Mead M.D.   On: 12/30/2013 13:32    Labs:  Basic Metabolic Panel:  Recent Labs Lab 12/30/13 1315 12/31/13 0315  NA 138 137  K 3.9  4.3  CL 103 99  CO2 24 21  GLUCOSE 136* 141*  BUN 13 12  CREATININE 0.67 0.63  CALCIUM 9.1 9.2   GFR Estimated Creatinine Clearance: 74.4 ml/min (by C-G formula based on Cr of 0.63). Liver Function Tests:  Recent Labs Lab 12/31/13 0315  AST 17  ALT 21  ALKPHOS 106  BILITOT <0.2*  PROT 7.0  ALBUMIN 3.6   CBC:  Recent Labs Lab 12/30/13 1315 12/31/13 0315  WBC 8.6 13.3*  HGB 14.1 14.0  HCT 41.1 41.4  MCV 86.2 86.6  PLT 297 299   D-Dimer  Recent Labs  12/30/13 1635  DDIMER <0.27   Microbiology Recent Results (from the past 240 hour(s))  CULTURE, EXPECTORATED SPUTUM-ASSESSMENT     Status: None   Collection Time    12/31/13  6:08 AM      Result Value Ref Range Status   Specimen Description SPUTUM   Final   Special Requests NONE   Final   Sputum evaluation     Final   Value: THIS SPECIMEN IS ACCEPTABLE. RESPIRATORY CULTURE REPORT TO FOLLOW.   Report Status 12/31/2013 FINAL   Final  CULTURE, RESPIRATORY (NON-EXPECTORATED)     Status: None   Collection Time    12/31/13  6:08 AM      Result Value Ref Range Status   Specimen Description SPUTUM   Final   Special Requests NONE   Final   Gram Stain     Final   Value: MODERATE WBC PRESENT,BOTH PMN AND MONONUCLEAR     NO SQUAMOUS EPITHELIAL CELLS SEEN     MODERATE GRAM POSITIVE COCCI IN PAIRS     MODERATE GRAM POSITIVE RODS     Performed at [facility]   Culture     Final   Value: Culture reincubated for better growth     Performed at [facility]   Report Status PENDING   Incomplete    Time coordinating discharge: 25 minutes.  Signed:  RAMA,CHRISTINA  Pager [phone] [facility] 01/02/2014, 10:08 AM

## 2014-01-02 NOTE — Discharge Instructions (Signed)
Bronchospasm, Adult A bronchospasm is a spasm or tightening of the airways going into the lungs. During a bronchospasm breathing becomes more difficult because the airways get smaller. When this happens there can be coughing, a whistling sound when breathing (wheezing), and difficulty breathing. Bronchospasm is often associated with asthma, but not all patients who experience a bronchospasm have asthma. CAUSES  A bronchospasm is caused by inflammation or irritation of the airways. The inflammation or irritation may be triggered by:   Allergies (such as to animals, pollen, food, or mold). Allergens that cause bronchospasm may cause wheezing immediately after exposure or many hours later.   Infection. Viral infections are believed to be the most common cause of bronchospasm.   Exercise.   Irritants (such as pollution, cigarette smoke, strong odors, aerosol sprays, and paint fumes).   Weather changes. Winds increase molds and pollens in the air. Rain refreshes the air by washing irritants out. Cold air may cause inflammation.   Stress and emotional upset.  SIGNS AND SYMPTOMS   Wheezing.   Excessive nighttime coughing.   Frequent or severe coughing with a simple cold.   Chest tightness.   Shortness of breath.  DIAGNOSIS  Bronchospasm is usually diagnosed through a history and physical exam. Tests, such as chest X-rays, are sometimes done to look for other conditions. TREATMENT   Inhaled medicines can be given to open up your airways and help you breathe. The medicines can be given using either an inhaler or a nebulizer machine.  Corticosteroid medicines may be given for severe bronchospasm, usually when it is associated with asthma. HOME CARE INSTRUCTIONS   Always have a plan prepared for seeking medical care. Know when to call your health care provider and local emergency services (911 in the [place] Know where you can access local emergency care.  Only take medicines as  directed by your health care provider.  If you were prescribed an inhaler or nebulizer machine, ask your health care provider to explain how to use it correctly. Always use a spacer with your inhaler if you were given one.  It is necessary to remain calm during an attack. Try to relax and breathe more slowly.  Control your home environment in the following ways:   Change your heating and air conditioning filter at least once a month.   Limit your use of fireplaces and wood stoves.  Do not smoke and do not allow smoking in your home.   Avoid exposure to perfumes and fragrances.   Get rid of pests (such as roaches and mice) and their droppings.   Throw away plants if you see mold on them.   Keep your house clean and dust free.   Replace carpet with wood, tile, or vinyl flooring. Carpet can trap dander and dust.   Use allergy-proof pillows, mattress covers, and box spring covers.   Wash bed sheets and blankets every week in hot water and dry them in a dryer.   Use blankets that are made of polyester or cotton.   Wash hands frequently. SEEK MEDICAL CARE IF:   You have muscle aches.   You have chest pain.   The sputum changes from clear or white to yellow, green, gray, or bloody.   The sputum you cough up gets thicker.   There are problems that may be related to the medicine you are given, such as a rash, itching, swelling, or trouble breathing.  SEEK IMMEDIATE MEDICAL CARE IF:   You have worsening wheezing and coughing   even after taking your prescribed medicines.   You have increased difficulty breathing.   You develop severe chest pain. MAKE SURE YOU:   Understand these instructions.  Will watch your condition.  Will get help right away if you are not doing well or get worse. Document Released: 09/27/2003 Document Revised: 05/27/2013 Document Reviewed: 03/16/2013 ExitCare Patient Information 2014 [employer].  

## 2014-01-03 LAB — RESPIRATORY VIRUS PANEL
ADENOVIRUS: NOT DETECTED
INFLUENZA A H1: NOT DETECTED
Influenza A H3: NOT DETECTED
Influenza A: NOT DETECTED
Influenza B: NOT DETECTED
Metapneumovirus: NOT DETECTED
PARAINFLUENZA 3 A: NOT DETECTED
Parainfluenza 1: NOT DETECTED
Parainfluenza 2: NOT DETECTED
RHINOVIRUS: NOT DETECTED
Respiratory Syncytial Virus A: NOT DETECTED
Respiratory Syncytial Virus B: NOT DETECTED

## 2014-01-03 LAB — CULTURE, RESPIRATORY

## 2014-01-03 LAB — CULTURE, RESPIRATORY W GRAM STAIN

## 2014-01-21 ENCOUNTER — Inpatient Hospital Stay (HOSPITAL_COMMUNITY)
Admission: EM | Admit: 2014-01-21 | Discharge: 2014-01-25 | DRG: 190 | Disposition: A | Discharge status: Home / Self Care | Payer: Self-pay | Attending: Family Medicine | Admitting: Family Medicine

## 2014-01-21 ENCOUNTER — Emergency Department (HOSPITAL_COMMUNITY): Payer: Self-pay

## 2014-01-21 ENCOUNTER — Encounter (HOSPITAL_COMMUNITY): Payer: Self-pay | Admitting: Emergency Medicine

## 2014-01-21 DIAGNOSIS — J189 Pneumonia, unspecified organism: Secondary | ICD-10-CM | POA: Diagnosis present

## 2014-01-21 DIAGNOSIS — R0789 Other chest pain: Secondary | ICD-10-CM | POA: Diagnosis present

## 2014-01-21 DIAGNOSIS — Z8701 Personal history of pneumonia (recurrent): Secondary | ICD-10-CM

## 2014-01-21 DIAGNOSIS — Z800 Family history of malignant neoplasm of digestive organs: Secondary | ICD-10-CM

## 2014-01-21 DIAGNOSIS — Z880 Allergy status to penicillin: Secondary | ICD-10-CM

## 2014-01-21 DIAGNOSIS — R918 Other nonspecific abnormal finding of lung field: Secondary | ICD-10-CM

## 2014-01-21 DIAGNOSIS — J441 Chronic obstructive pulmonary disease with (acute) exacerbation: Principal | ICD-10-CM | POA: Diagnosis present

## 2014-01-21 DIAGNOSIS — Z8249 Family history of ischemic heart disease and other diseases of the circulatory system: Secondary | ICD-10-CM

## 2014-01-21 DIAGNOSIS — J45901 Unspecified asthma with (acute) exacerbation: Principal | ICD-10-CM

## 2014-01-21 DIAGNOSIS — F17200 Nicotine dependence, unspecified, uncomplicated: Secondary | ICD-10-CM | POA: Diagnosis present

## 2014-01-21 LAB — CBC
HCT: 41.1 % (ref 36.0–46.0)
HEMOGLOBIN: 14.1 g/dL (ref 12.0–15.0)
MCH: 29.6 pg (ref 26.0–34.0)
MCHC: 34.3 g/dL (ref 30.0–36.0)
MCV: 86.2 fL (ref 78.0–100.0)
Platelets: 280 10*3/uL (ref 150–400)
RBC: 4.77 MIL/uL (ref 3.87–5.11)
RDW: 13.9 % (ref 11.5–15.5)
WBC: 8.2 10*3/uL (ref 4.0–10.5)

## 2014-01-21 LAB — I-STAT TROPONIN, ED: Troponin i, poc: 0 ng/mL (ref 0.00–0.08)

## 2014-01-21 LAB — BASIC METABOLIC PANEL
BUN: 18 mg/dL (ref 6–23)
CALCIUM: 9.4 mg/dL (ref 8.4–10.5)
CO2: 23 mEq/L (ref 19–32)
Chloride: 104 mEq/L (ref 96–112)
Creatinine, Ser: 0.68 mg/dL (ref 0.50–1.10)
GLUCOSE: 109 mg/dL — AB (ref 70–99)
POTASSIUM: 4.2 meq/L (ref 3.7–5.3)
SODIUM: 140 meq/L (ref 137–147)

## 2014-01-21 LAB — PRO B NATRIURETIC PEPTIDE: Pro B Natriuretic peptide (BNP): 55.7 pg/mL (ref 0–125)

## 2014-01-21 MED ORDER — PREDNISONE 50 MG PO TABS
60.0000 mg | ORAL_TABLET | Freq: Every day | ORAL | Status: DC
  Administered 2014-01-22 – 2014-01-25 (×4): 60 mg via ORAL
  Filled 2014-01-21 (×4): qty 1

## 2014-01-21 MED ORDER — IPRATROPIUM BROMIDE 0.02 % IN SOLN
0.5000 mg | Freq: Once | RESPIRATORY_TRACT | Status: AC
  Administered 2014-01-21: 0.5 mg via RESPIRATORY_TRACT
  Filled 2014-01-21: qty 2.5

## 2014-01-21 MED ORDER — DEXTROSE 5 % IV SOLN
1.0000 g | Freq: Three times a day (TID) | INTRAVENOUS | Status: DC
  Administered 2014-01-21 – 2014-01-22 (×2): 1 g via INTRAVENOUS
  Filled 2014-01-21 (×3): qty 1

## 2014-01-21 MED ORDER — PREDNISONE 20 MG PO TABS
60.0000 mg | ORAL_TABLET | Freq: Once | ORAL | Status: AC
  Administered 2014-01-21: 60 mg via ORAL
  Filled 2014-01-21: qty 3

## 2014-01-21 MED ORDER — ALBUTEROL SULFATE (2.5 MG/3ML) 0.083% IN NEBU
5.0000 mg | INHALATION_SOLUTION | Freq: Once | RESPIRATORY_TRACT | Status: AC
  Administered 2014-01-21: 5 mg via RESPIRATORY_TRACT
  Filled 2014-01-21: qty 6

## 2014-01-21 MED ORDER — ZOLPIDEM TARTRATE 5 MG PO TABS
5.0000 mg | ORAL_TABLET | Freq: Once | ORAL | Status: AC
  Administered 2014-01-21: 5 mg via ORAL
  Filled 2014-01-21: qty 1

## 2014-01-21 MED ORDER — VANCOMYCIN HCL IN DEXTROSE 750-5 MG/150ML-% IV SOLN
750.0000 mg | INTRAVENOUS | Status: AC
  Administered 2014-01-21: 750 mg via INTRAVENOUS
  Filled 2014-01-21: qty 150

## 2014-01-21 MED ORDER — VANCOMYCIN HCL IN DEXTROSE 750-5 MG/150ML-% IV SOLN
750.0000 mg | Freq: Two times a day (BID) | INTRAVENOUS | Status: DC
  Administered 2014-01-22 – 2014-01-23 (×3): 750 mg via INTRAVENOUS
  Filled 2014-01-21 (×4): qty 150

## 2014-01-21 MED ORDER — ALBUTEROL SULFATE (2.5 MG/3ML) 0.083% IN NEBU
5.0000 mg | INHALATION_SOLUTION | RESPIRATORY_TRACT | Status: AC | PRN
  Administered 2014-01-21 – 2014-01-22 (×2): 5 mg via RESPIRATORY_TRACT
  Filled 2014-01-21 (×2): qty 6

## 2014-01-21 MED ORDER — AZTREONAM 2 G IJ SOLR
2.0000 g | Freq: Once | INTRAMUSCULAR | Status: AC
  Administered 2014-01-21: 2 g via INTRAVENOUS

## 2014-01-21 NOTE — H&P (Signed)
[facility] History and Physical  Angel Hartorma J Arrellano [id number] DOB: [birth year] DOA: 01/21/2014  Referring physician: ED PCP: No primary provider on file.  Specialists:  none  Chief Complaint: Cough SOB  HPI: Angel [name] is a 58 y.o. [facility].  Recent hospitlized 3/25-3/28 wioth COPD exacerbation, compliant with therapy, took azithro/prednisone, stopped smoking! Felt woerse about 1-2 days after Abx and steroids finished Coughing more and slightly SOb with increased wheeze and inc recue inhlaer use. 4/13 became more SOb, now c CP and plurisy and constant dioscomfor tin chest when taking deep breaths.  No sputum, no ill contacts, no diarrhea or mylagias Came to Ed as couldn;t sleep and needed to sit upright as was so SOB.  No relief from recue inhaler Note that her last Sputum grew Strep Pneumonia-unfortunatekly couldn't get OV sooner than may 15th so wa snot followed up  Review of Systems: see above  Past Medical History  Diagnosis Date  . Asthma   . Bronchiolitis    Past Surgical History  Procedure Laterality Date  . Abdominal hysterectomy    . 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 Penicillins Hives    Family History  Problem Relation Age of Onset  . CAD Father   . Colon cancer Mother     Prior to Admission medications   Medication Sig Start Date End Date Taking? Authorizing Provider  albuterol (PROVENTIL HFA;VENTOLIN HFA) 108 (90 BASE) MCG/ACT inhaler Inhale 1-2 puffs into the lungs every 6 (six) hours as needed for wheezing or shortness of breath. 01/02/14  Yes Christina P Rama, MD  guaiFENesin (MUCINEX) 600 MG 12 hr tablet Take 1 tablet (600 mg total) by mouth 2 (two) times  daily. 01/02/14  Yes Christina P Rama, MD  nicotine (NICODERM CQ - DOSED IN MG/24 HR) 7 mg/24hr patch Place 1 patch (7 mg total) onto the skin daily. 01/02/14  Yes Maryruth Bunhristina P Rama, MD  predniSONE (STERAPRED UNI-PAK) 10 MG tablet Take as directed. 01/02/14  Yes Maryruth Bunhristina P Rama, MD   Physical Exam: Filed Vitals:   01/21/14 1240 01/21/14 1306  BP: 161/79   Temp: 98.7 F (37.1 C)   TempSrc: Oral   Resp: 27   Height:  5' 6.14" (1.68 m)  Weight:  69.6 kg (153 lb 7 oz)  SpO2: 93%      General:  EOMU, NCAT  Eyes: nad  ENT: Mild LAD anteriorly, no thyromegally  Neck: soft supple  Cardiovascular: s1 s2 no m/r/g  Respiratory: wheezy throughout, no TVR/F  Abdomen: no tenderness to abd  Skin:  No le edema  Musculoskeletal: ROM intact  Psychiatric: Euthymic  Neurologic: INtact moves all 4 limbs =.  NO deficits, power 5/5  Labs on Admission:  Basic Metabolic Panel:  Recent Labs Lab 01/21/14 1310  NA 140  K 4.2  CL 104  CO2 23  GLUCOSE 109*  BUN 18  CREATININE 0.68  CALCIUM 9.4   Liver Function Tests: No results found for this basename: AST, ALT, ALKPHOS, BILITOT, PROT, ALBUMIN,  in the last 168 hours No results found for this basename: LIPASE, AMYLASE,  in the last 168 hours  No results found for this basename: AMMONIA,  in the last 168 hours CBC:  Recent Labs Lab 01/21/14 1310  WBC 8.2  HGB 14.1  HCT 41.1  MCV 86.2  PLT 280   Cardiac Enzymes: No results found for this basename: CKTOTAL, CKMB, CKMBINDEX, TROPONINI,  in the last 168 hours  BNP (last 3 results)  Recent Labs  01/21/14 1310  PROBNP 55.7   CBG: No results found for this basename: GLUCAP,  in the last 168 hours  Radiological Exams on Admission: Dg Chest Port 1 View  01/21/2014   CLINICAL DATA:  Left chest pain. Respiratory issues. Hypertension. Smoker  EXAM: PORTABLE CHEST - 1 VIEW  COMPARISON:  12/30/2013.  FINDINGS: The heart size and mediastinal contours are within normal limits. There  is a new medial segment, possibly azygos lobe, right upper lobe infiltrate not present previously. No effusion or pneumothorax. Bones unremarkable.  IMPRESSION: Right upper lobe infiltrate is new from priors.  In this patient with history of smoking, follow-up chest x-rays recommended until complete resolution of infiltrate to exclude a proximal obstructing lesion.   Electronically Signed   By: Davonna BellingJohn  Curnes M.D.   On: 01/21/2014 13:20    EKG: Independently reviewed. Sinus, nAxis 45 degrees, NO St-T changes  Assessment/Plan Active Problems:   COPD exacerbation-Wheezy-added Albuterol neb q4 prn, scheduled Ipratropium.  Continue steroids prednisone 60 mg daily and monitor   HCAP (healthcare-associated pneumonia)-Likel;y Strep PNa-repeat labs.  NO fever chills but could have been partially treated.  Agree with HCAP coverage, WBC am.  Monitor for resolution.   Former smoker-Congratulated   Time spent: 8750  Rhetta MuraJai-Gurmukh Darey Hershberger [facility] Pager [phone]  If 7PM-7AM, please contact night-coverage [email or web address] Password [email or web address] 01/21/2014, 2:54 PM

## 2014-01-21 NOTE — ED Provider Notes (Addendum)
CSN: [id number]     Arrival date & time 01/21/14  1235 History   First MD Initiated Contact with Patient 01/21/14 1253     Chief Complaint  Patient presents with  . Shortness of Breath     (Consider location/radiation/quality/duration/timing/severity/associated sxs/prior Treatment) Patient is a 58 y.o. female presenting with shortness of breath. The history is provided by the patient.  Shortness of Breath Associated symptoms: cough and wheezing   Associated symptoms: no abdominal pain, no fever, no headaches, no neck pain, no rash, no sore throat and no vomiting   pt with hx RAD, c/o wheezing, increased non prod cough, and sob in past week. States was hospitalized last month w same. +smoker. States chest has felt tight for the past 2 days, constant. No discrete episodes of cp or discomfort. No sore throat, runny nose, or other uri c/o. No fever or chills. No leg pain or swelling. No immobility, trauma, surgery, travel. No hx dvt or pe. No orthopnea or pnd.      Past Medical History  Diagnosis Date  . Asthma   . Bronchiolitis    Past Surgical History  Procedure Laterality Date  . Abdominal hysterectomy    . Appendectomy     No family history on file. History  Substance Use Topics  . Smoking status: Current Every Day Smoker  . Smokeless tobacco: Not on file  . Alcohol Use: No   OB History   Grav Para Term Preterm Abortions TAB SAB Ect Mult Living                 Review of Systems  Constitutional: Negative for fever and chills.  HENT: Negative for sore throat.   Eyes: Negative for redness.  Respiratory: Positive for cough, shortness of breath and wheezing.   Cardiovascular: Negative for leg swelling.  Gastrointestinal: Negative for nausea, vomiting and abdominal pain.  Genitourinary: Negative for flank pain.  Musculoskeletal: Negative for back pain and neck pain.  Skin: Negative for rash.  Neurological: Negative for headaches.  Hematological: Does not bruise/bleed  easily.  Psychiatric/Behavioral: Negative for confusion.      Allergies  Penicillins  Home Medications   Prior to Admission medications   Medication Sig Start Date End Date Taking? Authorizing Provider  albuterol (PROVENTIL HFA;VENTOLIN HFA) 108 (90 BASE) MCG/ACT inhaler Inhale 1-2 puffs into the lungs every 6 (six) hours as needed for wheezing or shortness of breath. 01/02/14   Maryruth Bun Rama, MD  guaiFENesin (MUCINEX) 600 MG 12 hr tablet Take 1 tablet (600 mg total) by mouth 2 (two) times daily. 01/02/14   Maryruth Bun Rama, MD  nicotine (NICODERM CQ - DOSED IN MG/24 HR) 7 mg/24hr patch Place 1 patch (7 mg total) onto the skin daily. 01/02/14   Maryruth Bun Rama, MD  predniSONE (STERAPRED UNI-PAK) 10 MG tablet Take as directed. 01/02/14   Christina P Rama, MD   BP 161/79  Temp(Src) 98.7 F (37.1 C) (Oral)  Resp 27  SpO2 93% Physical Exam  Nursing note and vitals reviewed. Constitutional: She is oriented to person, place, and time. She appears well-developed and well-nourished. No distress.  HENT:  Nose: Nose normal.  Mouth/Throat: Oropharynx is clear and moist.  Eyes: Conjunctivae are normal. No scleral icterus.  Neck: Neck supple. No JVD present. No tracheal deviation present.  Cardiovascular: Normal rate, regular rhythm, normal heart sounds and intact distal pulses.  Exam reveals no gallop and no friction rub.   No murmur heard. Pulmonary/Chest: Effort normal. No respiratory  distress. She has wheezes.  Abdominal: Soft. Normal appearance and bowel sounds are normal. She exhibits no distension and no mass. There is no tenderness. There is no rebound and no guarding.  Genitourinary:  No cva tenderness  Musculoskeletal: She exhibits no edema and no tenderness.  Neurological: She is alert and oriented to person, place, and time.  Skin: Skin is warm and dry. No rash noted.  Psychiatric: She has a normal mood and affect.    ED Course  Procedures (including critical care  time)  Results for orders placed during the hospital encounter of 01/21/14  CBC      Result Value Ref Range   WBC 8.2  4.0 - 10.5 K/uL   RBC 4.77  3.87 - 5.11 MIL/uL   Hemoglobin 14.1  12.0 - 15.0 g/dL   HCT 16.141.1  09.636.0 - 04.546.0 %   MCV 86.2  78.0 - 100.0 fL   MCH 29.6  26.0 - 34.0 pg   MCHC 34.3  30.0 - 36.0 g/dL   RDW 40.913.9  81.111.5 - 91.415.5 %   Platelets 280  150 - 400 K/uL  BASIC METABOLIC PANEL      Result Value Ref Range   Sodium 140  137 - 147 mEq/L   Potassium 4.2  3.7 - 5.3 mEq/L   Chloride 104  96 - 112 mEq/L   CO2 23  19 - 32 mEq/L   Glucose, Bld 109 (*) 70 - 99 mg/dL   BUN 18  6 - 23 mg/dL   Creatinine, Ser 7.820.68  0.50 - 1.10 mg/dL   Calcium 9.4  8.4 - 95.610.5 mg/dL   GFR calc non Af Amer >90  >90 mL/min   GFR calc Af Amer >90  >90 mL/min  PRO B NATRIURETIC PEPTIDE      Result Value Ref Range   Pro B Natriuretic peptide (BNP) 55.7  0 - 125 pg/mL  I-STAT TROPOININ, ED      Result Value Ref Range   Troponin i, poc 0.00  0.00 - 0.08 ng/mL   Comment 3            Dg Chest Port 1 View  01/21/2014   CLINICAL DATA:  Left chest pain. Respiratory issues. Hypertension. Smoker  EXAM: PORTABLE CHEST - 1 VIEW  COMPARISON:  12/30/2013.  FINDINGS: The heart size and mediastinal contours are within normal limits. There is a new medial segment, possibly azygos lobe, right upper lobe infiltrate not present previously. No effusion or pneumothorax. Bones unremarkable.  IMPRESSION: Right upper lobe infiltrate is new from priors.  In this patient with history of smoking, follow-up chest x-rays recommended until complete resolution of infiltrate to exclude a proximal obstructing lesion.   Electronically Signed   By: Davonna BellingJohn  Curnes M.D.   On: 01/21/2014 13:20   Dg Chest Port 1 View  12/30/2013   CLINICAL DATA:  Shortness of breath, asthma  EXAM: PORTABLE CHEST - 1 VIEW  COMPARISON:  11/28/2009  FINDINGS: Cardiomediastinal silhouette is stable. No acute infiltrate or pleural effusion. No pulmonary edema.  Bony thorax is unremarkable.  IMPRESSION: No active disease.   Electronically Signed   By: Natasha MeadLiviu  Pop M.D.   On: 12/30/2013 13:32        EKG Interpretation   Date/Time:  Thursday January 21 2014 12:44:07 EDT Ventricular Rate:  93 PR Interval:  154 QRS Duration: 97 QT Interval:  369 QTC Calculation: 459 R Axis:   77 Text Interpretation:  Sinus rhythm Baseline wander in  lead(s) V2 No  significant change since last tracing Confirmed by [facility]  MD, Caryn BeeKEVIN  ([id number]) on 01/21/2014 1:07:56 PM      MDM  Continuous pulse ox/monitor.  Albuterol and atrovent neb. pred po.  Reviewed nursing notes and prior charts for additional history.   Persistent wheezing.  Additional albuterol and atrovent neb.  cxr w new infiltrate, recent in hospital, increased cough, will rx for possible hcap (although no fever, no elevated wbc), allergy to pcn, aztreonam and vanc iv.  Given smoking hx, infil on cxr, if fails to clear post tx, may need ct chest to further eval.   Additional alb and atrovent neb.  Medical service contact for admission.       Suzi RootsKevin E Chibuike Fleek, MD 01/21/14 [phone]

## 2014-01-21 NOTE — Progress Notes (Signed)
[facility] provided pt with a list of primary care resources and a [facility] to help patient establish primary care.

## 2014-01-21 NOTE — ED Notes (Signed)
Report called to 5th floor

## 2014-01-21 NOTE — Progress Notes (Signed)
ANTIBIOTIC CONSULT NOTE - INITIAL  Pharmacy Consult for vancomycin, Aztreonam Indication: HAP  Allergies  Allergen Reactions  . Penicillins Hives    Patient Measurements: Height: 5' 6.14" (168 cm) Weight: 153 lb 7 oz (69.6 kg) IBW/kg (Calculated) : 59.63  Vital Signs: Temp: 98.7 F (37.1 C) (04/16 1240) Temp src: Oral (04/16 1240) BP: 161/79 mmHg (04/16 1240) Intake/Output from previous day:   Intake/Output from this shift:    Labs:  Recent Labs  01/21/14 1310  WBC 8.2  HGB 14.1  PLT 280  CREATININE 0.68   Estimated Creatinine Clearance: 74.8 ml/min (by C-G formula based on Cr of 0.68). No results found for this basename: VANCOTROUGH, Corlis Leak, VANCORANDOM, [place], GENTPEAK, GENTRANDOM, TOBRATROUGH, TOBRAPEAK, TOBRARND, AMIKACINPEAK, AMIKACINTROU, AMIKACIN,  in the last 72 hours   Microbiology: Recent Results (from the past 720 hour(s))  RESPIRATORY VIRUS PANEL     Status: None   Collection Time    12/30/13  8:52 PM      Result Value Ref Range Status   Source - RVPAN NASOPHARYNGEAL   Final   Respiratory Syncytial Virus A NOT DETECTED   Final   Respiratory Syncytial Virus B NOT DETECTED   Final   Influenza A NOT DETECTED   Final   Influenza B NOT DETECTED   Final   Parainfluenza 1 NOT DETECTED   Final   Parainfluenza 2 NOT DETECTED   Final   Parainfluenza 3 NOT DETECTED   Final   Metapneumovirus NOT DETECTED   Final   Rhinovirus NOT DETECTED   Final   Adenovirus NOT DETECTED   Final   Influenza A H1 NOT DETECTED   Final   Influenza A H3 NOT DETECTED   Final   Comment: (NOTE)           Normal Reference Range for each Analyte: NOT DETECTED     Testing performed using the Luminex xTAG Respiratory Viral Panel test     kit.     This test was developed and its performance characteristics determined     by [employer]. It has not been cleared or approved by the [place]     Food and Drug Administration. This test is used for clinical purposes.     It  should not be regarded as investigational or for research. This     laboratory is certified under the [place] (CLIA) as qualified to perform high complexity     clinical laboratory testing.     Performed at [place], EXPECTORATED SPUTUM-ASSESSMENT     Status: None   Collection Time    12/31/13  6:08 AM      Result Value Ref Range Status   Specimen Description SPUTUM   Final   Special Requests NONE   Final   Sputum evaluation     Final   Value: THIS SPECIMEN IS ACCEPTABLE. RESPIRATORY CULTURE REPORT TO FOLLOW.   Report Status 12/31/2013 FINAL   Final  CULTURE, RESPIRATORY (NON-EXPECTORATED)     Status: None   Collection Time    12/31/13  6:08 AM      Result Value Ref Range Status   Specimen Description SPUTUM   Final   Special Requests NONE   Final   Gram Stain     Final   Value: MODERATE WBC PRESENT,BOTH PMN AND MONONUCLEAR     NO SQUAMOUS EPITHELIAL CELLS SEEN     MODERATE GRAM POSITIVE COCCI IN  PAIRS     MODERATE GRAM POSITIVE RODS     Performed at [facility]   Culture     Final   Value: MODERATE STREPTOCOCCUS PNEUMONIAE     Performed at [facility]   Report Status 01/03/2014 FINAL   Final   Organism ID, Bacteria STREPTOCOCCUS PNEUMONIAE   Final    Medical History: Past Medical History  Diagnosis Date  . Asthma   . Bronchiolitis     Medications:  Scheduled:  . albuterol  5 mg Nebulization Once  . ipratropium  0.5 mg Nebulization Once   Infusions:  . aztreonam     Assessment: 58 yo female just discharged from [facility] on 3/[facility] after asthma exacerbation treated with Zithromax. Patient presents to ER with coughing, wheezing to start vancomycin and aztreonam for possible HAP. Noted PCN allergy. Baseline SCr 0.68 with est CrCl of 75 ml/min and afebrile   Goal of Therapy:  Vancomycin trough level 15-20 mcg/ml  Plan:  1) Vancomycin 753m IV q12 2) Aztreonam 2g IV x 1 in ER then 1g IV  q8   JAdrian Saran PharmD, BCPS Pager [phone]/16/2015 2:04 PM

## 2014-01-21 NOTE — ED Notes (Addendum)
Pt c/o SOB and productive cough x 1 days and left chest pain x 3 days that radiates under arm.

## 2014-01-22 LAB — HIV ANTIBODY (ROUTINE TESTING W REFLEX): HIV: NONREACTIVE

## 2014-01-22 LAB — CBC
HCT: 39.3 % (ref 36.0–46.0)
Hemoglobin: 13.6 g/dL (ref 12.0–15.0)
MCH: 29.5 pg (ref 26.0–34.0)
MCHC: 34.6 g/dL (ref 30.0–36.0)
MCV: 85.2 fL (ref 78.0–100.0)
Platelets: 274 10*3/uL (ref 150–400)
RBC: 4.61 MIL/uL (ref 3.87–5.11)
RDW: 13.9 % (ref 11.5–15.5)
WBC: 12.4 10*3/uL — ABNORMAL HIGH (ref 4.0–10.5)

## 2014-01-22 LAB — CREATININE, SERUM
CREATININE: 0.66 mg/dL (ref 0.50–1.10)
GFR calc Af Amer: >90 mL/min (ref >90)
GFR calc non Af Amer: >90 mL/min (ref >90)

## 2014-01-22 LAB — EXPECTORATED SPUTUM ASSESSMENT W REFEX TO RESP CULTURE

## 2014-01-22 LAB — STREP PNEUMONIAE URINARY ANTIGEN: Strep Pneumo Urinary Antigen: NEGATIVE

## 2014-01-22 LAB — EXPECTORATED SPUTUM ASSESSMENT W GRAM STAIN, RFLX TO RESP C

## 2014-01-22 MED ORDER — DEXTROSE 5 % IV SOLN
2.0000 g | Freq: Three times a day (TID) | INTRAVENOUS | Status: DC
  Administered 2014-01-22 – 2014-01-23 (×3): 2 g via INTRAVENOUS
  Filled 2014-01-22 (×4): qty 2

## 2014-01-22 MED ORDER — ALBUTEROL SULFATE HFA 108 (90 BASE) MCG/ACT IN AERS: 1.0000 | INHALATION_SPRAY | Freq: Four times a day (QID) | RESPIRATORY_TRACT | Status: DC | PRN

## 2014-01-22 MED ORDER — ZOLPIDEM TARTRATE 5 MG PO TABS
5.0000 mg | ORAL_TABLET | Freq: Every evening | ORAL | Status: DC | PRN
  Administered 2014-01-22: 5 mg via ORAL
  Filled 2014-01-22: qty 1

## 2014-01-22 MED ORDER — GUAIFENESIN ER 600 MG PO TB12
600.0000 mg | ORAL_TABLET | Freq: Two times a day (BID) | ORAL | Status: DC
  Filled 2014-01-22 (×2): qty 1

## 2014-01-22 MED ORDER — IPRATROPIUM BROMIDE 0.02 % IN SOLN
0.5000 mg | RESPIRATORY_TRACT | Status: DC | PRN
  Administered 2014-01-23: 0.5 mg via RESPIRATORY_TRACT
  Filled 2014-01-22: qty 2.5

## 2014-01-22 MED ORDER — TRAMADOL HCL 50 MG PO TABS
50.0000 mg | ORAL_TABLET | Freq: Four times a day (QID) | ORAL | Status: DC | PRN
  Administered 2014-01-22 – 2014-01-24 (×6): 50 mg via ORAL
  Filled 2014-01-22 (×6): qty 1

## 2014-01-22 MED ORDER — NICOTINE 7 MG/24HR TD PT24
7.0000 mg | MEDICATED_PATCH | Freq: Every day | TRANSDERMAL | Status: DC
  Administered 2014-01-22 – 2014-01-25 (×4): 7 mg via TRANSDERMAL
  Filled 2014-01-22 (×4): qty 1

## 2014-01-22 MED ORDER — ALBUTEROL SULFATE (2.5 MG/3ML) 0.083% IN NEBU
2.5000 mg | INHALATION_SOLUTION | RESPIRATORY_TRACT | Status: DC | PRN
  Administered 2014-01-23: 2.5 mg via RESPIRATORY_TRACT
  Filled 2014-01-22: qty 3

## 2014-01-22 MED ORDER — DEXTROMETHORPHAN POLISTIREX 30 MG/5ML PO LQCR
30.0000 mg | Freq: Two times a day (BID) | ORAL | Status: DC
  Administered 2014-01-22 – 2014-01-25 (×7): 30 mg via ORAL
  Filled 2014-01-22 (×8): qty 5

## 2014-01-22 MED ORDER — IPRATROPIUM-ALBUTEROL 20-100 MCG/ACT IN AERS: 1.0000 | INHALATION_SPRAY | Freq: Four times a day (QID) | RESPIRATORY_TRACT | Status: DC | PRN

## 2014-01-22 MED ORDER — ACETAMINOPHEN 325 MG PO TABS
650.0000 mg | ORAL_TABLET | Freq: Four times a day (QID) | ORAL | Status: DC | PRN
  Administered 2014-01-22 – 2014-01-24 (×3): 650 mg via ORAL
  Filled 2014-01-22 (×3): qty 2

## 2014-01-22 MED ORDER — HEPARIN SODIUM (PORCINE) 5000 UNIT/ML IJ SOLN
5000.0000 [IU] | Freq: Three times a day (TID) | INTRAMUSCULAR | Status: DC
  Administered 2014-01-22 – 2014-01-25 (×9): 5000 [IU] via SUBCUTANEOUS
  Filled 2014-01-22 (×14): qty 1

## 2014-01-22 MED ORDER — IPRATROPIUM-ALBUTEROL 0.5-2.5 (3) MG/3ML IN SOLN
3.0000 mL | Freq: Three times a day (TID) | RESPIRATORY_TRACT | Status: DC
  Administered 2014-01-22 – 2014-01-23 (×4): 3 mL via RESPIRATORY_TRACT
  Filled 2014-01-22 (×4): qty 3

## 2014-01-22 NOTE — Progress Notes (Signed)
PT demonstrated verbal and hands on understanding of Flutter device. 

## 2014-01-22 NOTE — Care Management Note (Signed)
    Page 1 of 1   01/22/2014     12:27:04 PM CARE MANAGEMENT NOTE 01/22/2014  Patient:  Angel [name],Angel [name]   Account Number:  [id number]  Date Initiated:  01/22/2014  Documentation initiated by:  Lorenda IshiharaPEELE,Deontre Allsup  Subjective/Objective Assessment:   58 yo female admitted with PNA, COPD. PTA lived at home alone.     Action/Plan:   Home when stable   Anticipated DC Date:  01/25/2014   Anticipated DC Plan:  HOME/SELF CARE      DC Planning Services  CM consult      Choice offered to / List presented to:             Status of service:  Completed, signed off Medicare Important Message given?   (If response is "NO", the following Medicare IM given date fields will be blank) Date Medicare IM given:   Date Additional Medicare IM given:    Discharge Disposition:  HOME/SELF CARE  Per UR Regulation:  Reviewed for med. necessity/level of care/duration of stay  If discussed at Long Length of Stay Meetings, dates discussed:    Comments:  01-22-14 Lorenda IshiharaSuzanne Tersa Fotopoulos RN CM 1200 Spoke with patient at bedside regarding medication needs. She states she thinks she has what she needs now, has inhalers at home. She is not sure what she will need going forward. [facility] as [facility] used [facility] on her last hospital visit in March. [facility], [facility] Will continue to follow for d/c needs. [facility] and has an appt scheduled for 5/15, will try to move that up. Left message with clinic to call me regarding this appt.

## 2014-01-22 NOTE — Progress Notes (Signed)
Note: This document was prepared with digital dictation and possible smart phrase technology. Any transcriptional errors that result from this process are unintentional.   Angel [name] ZOX:[id number] DOB: 12/24/1955 DOA: 01/21/2014 PCP: No primary provider on file.  Brief narrative: 58 [facility]. [facility]. Recent hospitlized 3/25-3/28 wioth COPD exacerbation, compliant with therapy, took azithro/prednisone-readmitted with 3-4 day h/o increasing cough, sputum, fever and mianly SOB Found on CXR to have HCAP R side in a setting of Prior Strep Pneumonia on Sputum culture from last hospital stay   Past medical history-As per Problem list Chart reviewed as below- reviewed  Consultants:   none  Procedures:  none  Antibiotics:  Aztreonam 4/16  Vancomycin 4/16   Subjective  C/o CP.  Didn;t sleep much last night 2/2 coughing.  Minimal sputum CP non radiating-mainly in the L chest and feels like a pulled muscle.  No rad features   Objective    Interim History:   Telemetry:    Objective: Filed Vitals:   01/21/14 2118 01/22/14 0148 01/22/14 0558 01/22/14 0747  BP: 144/72 118/77 124/88   Pulse: 97 95 89   Temp: 97.5 F (36.4 C) 97.5 F (36.4 C) 97.5 F (36.4 C)   TempSrc: Oral Oral Oral   Resp: 20 20 20    Height:      Weight:      SpO2: 95% 93% 93% 89%    Intake/Output Summary (Last 24 hours) at 01/22/14 0856 Last data filed at 01/22/14 0558  Gross per 24 hour  Intake    250 ml  Output    850 ml  Net   -600 ml    Exam:  General: EOMI, NCAT Cardiovascular: s1 s2 no m/r/g Respiratory: wheezy throughout, No tvr/F Abdomen: soft, NT/ND Skinno le edema   Data Reviewed: Basic Metabolic Panel:  Recent Labs Lab 01/21/14 1310  NA 140  K 4.2  CL 104  CO2 23  GLUCOSE 109*  BUN 18  CREATININE 0.68  CALCIUM 9.4   Liver Function Tests: No results found for this basename: AST, ALT, ALKPHOS, BILITOT, PROT, ALBUMIN,  in the last  168 hours No results found for this basename: LIPASE, AMYLASE,  in the last 168 hours No results found for this basename: AMMONIA,  in the last 168 hours CBC:  Recent Labs Lab 01/21/14 1310 01/22/14 0820  WBC 8.2 12.4*  HGB 14.1 13.6  HCT 41.1 39.3  MCV 86.2 85.2  PLT 280 274   Cardiac Enzymes: No results found for this basename: CKTOTAL, CKMB, CKMBINDEX, TROPONINI,  in the last 168 hours BNP: No components found with this basename: POCBNP,  CBG: No results found for this basename: GLUCAP,  in the last 168 hours  No results found for this or any previous visit (from the past 240 hour(s)).   Studies:              All Imaging reviewed and is as per above notation   Scheduled Meds: . aztreonam  2 g Intravenous Q8H  . dextromethorphan  30 mg Oral BID  . guaiFENesin  600 mg Oral BID  . heparin  5,000 Units Subcutaneous 3 times per day  . ipratropium-albuterol  3 mL Nebulization TID  . nicotine  7 mg Transdermal Daily  . predniSONE  60 mg Oral QAC breakfast  . vancomycin  750 mg Intravenous Q12H   Continuous Infusions:    Assessment/Plan: 1. HCAP-continue empiric coverage-await strep pneumo/legionella/sputum cult-probably would narrow in  am to PO levaquin for 7 to 10 days as Strep PNA from last visit was sensitive to this 2. COPD exacerbation-continue Steroids Prednisone 60 mg daily for a 5-7 day burst.  continue albuterol nebs q 4 prn-given increasing cougha nd pain added delsym 30 mg bid.  Continue atrovent 0.5 q4 prn.  NO O2 req now.  OOB to chair and ambulate/shower prn  Code Status: Full Family Communication:  None bedside Disposition Plan: inpatient   Pleas KochJai Kayson Bullis, MD  [facility] Pager [phone] 01/22/2014, 8:56 AM    LOS: 1 day

## 2014-01-23 LAB — LEGIONELLA ANTIGEN, URINE: Legionella Antigen, Urine: NEGATIVE

## 2014-01-23 LAB — CBC
HEMATOCRIT: 38.7 % (ref 36.0–46.0)
HEMOGLOBIN: 12.9 g/dL (ref 12.0–15.0)
MCH: 28.7 pg (ref 26.0–34.0)
MCHC: 33.3 g/dL (ref 30.0–36.0)
MCV: 86 fL (ref 78.0–100.0)
Platelets: 294 10*3/uL (ref 150–400)
RBC: 4.5 MIL/uL (ref 3.87–5.11)
RDW: 14.3 % (ref 11.5–15.5)
WBC: 11.9 10*3/uL — ABNORMAL HIGH (ref 4.0–10.5)

## 2014-01-23 MED ORDER — LEVOFLOXACIN 500 MG PO TABS
500.0000 mg | ORAL_TABLET | Freq: Every day | ORAL | Status: DC
  Administered 2014-01-23 – 2014-01-25 (×3): 500 mg via ORAL
  Filled 2014-01-23 (×3): qty 1

## 2014-01-23 MED ORDER — ALBUTEROL SULFATE (2.5 MG/3ML) 0.083% IN NEBU
2.5000 mg | INHALATION_SOLUTION | RESPIRATORY_TRACT | Status: DC
  Administered 2014-01-23 – 2014-01-24 (×8): 2.5 mg via RESPIRATORY_TRACT
  Filled 2014-01-23 (×8): qty 3

## 2014-01-23 NOTE — Progress Notes (Signed)
Note: This document was prepared with digital dictation and possible smart phrase technology. Any transcriptional errors that result from this process are unintentional.   Luisa Hartorma J Mcginness [id number] DOB: 1956-02-14 DOA: 01/21/2014 PCP: No primary provider on file.  Brief narrative: 58 [facility]. Recent hospitlized 3/25-3/28 wioth COPD exacerbation, compliant with therapy, took azithro/prednisone-readmitted with 3-4 day h/o increasing cough, sputum, fever and mianly SOB Found on CXR to have HCAP R side in a setting of Prior Strep Pneumonia on Sputum culture from last hospital stay   Past medical history-As per Problem list Chart reviewed as below- reviewed  Consultants:   none  Procedures:  none  Antibiotics:  Aztreonam 4/16  Vancomycin 4/16   Subjective  C/o CP still. Felt better in the afternoon but now feels a little worse with cough thisam Needed Neb at 02:00 am NO n/v and tol diet CP very unlikely cardiac as point tenderness No fever or chills   Objective    Interim History:   Telemetry:    Objective: Filed Vitals:   01/22/14 1955 01/22/14 2138 01/23/14 0213 01/23/14 0520  BP:  139/82  128/80  Pulse:  82  81  Temp:  97.9 F (36.6 C)  97.9 F (36.6 C)  TempSrc:  Oral  Oral  Resp:  18  20  Height:      Weight:      SpO2: 92% 92% 93% 94%    Intake/Output Summary (Last 24 hours) at 01/23/14 [id number] Last data filed at 01/23/14 [id number]  Gross per 24 hour  Intake    810 ml  Output   2025 ml  Net  -1215 ml    Exam:  General: EOMI, NCAT Cardiovascular: s1 s2 no m/r/g Respiratory: wheezy throughout, No tvr/F Abdomen: soft, NT/ND Skinno le edema   Data Reviewed: Basic Metabolic Panel:  Recent Labs Lab 01/21/14 1310 01/22/14 0820  NA 140  --   K 4.2  --   CL 104  --   CO2 23  --   GLUCOSE 109*  --   BUN 18  --   CREATININE 0.68 0.66  CALCIUM 9.4  --    Liver Function Tests: No results found for  this basename: AST, ALT, ALKPHOS, BILITOT, PROT, ALBUMIN,  in the last 168 hours No results found for this basename: LIPASE, AMYLASE,  in the last 168 hours No results found for this basename: AMMONIA,  in the last 168 hours CBC:  Recent Labs Lab 01/21/14 1310 01/22/14 0820 01/23/14 0530  WBC 8.2 12.4* 11.9*  HGB 14.1 13.6 12.9  HCT 41.1 39.3 38.7  MCV 86.2 85.2 86.0  PLT 280 274 294   Cardiac Enzymes: No results found for this basename: CKTOTAL, CKMB, CKMBINDEX, TROPONINI,  in the last 168 hours BNP: No components found with this basename: POCBNP,  CBG: No results found for this basename: GLUCAP,  in the last 168 hours  Recent Results (from the past 240 hour(s))  CULTURE, EXPECTORATED SPUTUM-ASSESSMENT     Status: None   Collection Time    01/22/14 10:36 AM      Result Value Ref Range Status   Specimen Description SPUTUM   Final   Special Requests NONE   Final   Sputum evaluation     Final   Value: THIS SPECIMEN IS ACCEPTABLE. RESPIRATORY CULTURE REPORT TO FOLLOW.   Report Status 01/22/2014 FINAL   Final     Studies:  All Imaging reviewed and is as per above notation   Scheduled Meds: . albuterol  2.5 mg Nebulization Q4H  . dextromethorphan  30 mg Oral BID  . heparin  5,000 Units Subcutaneous 3 times per day  . ipratropium-albuterol  3 mL Nebulization TID  . levofloxacin  500 mg Oral Daily  . nicotine  7 mg Transdermal Daily  . predniSONE  60 mg Oral QAC breakfast   Continuous Infusions:    Assessment/Plan: 1. HCAP-continue empiric coverage-negative strep pneumo/legionella/sputum cult-narrowed to PO levaquin for 7 to 10 days as Strep PNA from last visit was sensitive to this. No WBC no fever or chills 2. COPD exacerbation-continue Steroids Prednisone 60 mg daily for a 5-7 day burst.  continue albuterol nebs q 4 prn-given increasing cough and pain added delsym 30 mg bid.  Continue atrovent 0.5 q4 prn.  NO O2 req now.  OOB to chair and ambulate/shower  prn  Code Status: Full Family Communication:  None bedside Disposition Plan: inpatient   Pleas KochJai Evertt Chouinard, MD  [facility] Pager [phone] 01/23/2014, 8:42 AM    LOS: 2 days

## 2014-01-24 LAB — CULTURE, RESPIRATORY W GRAM STAIN: Culture: NORMAL

## 2014-01-24 LAB — CULTURE, RESPIRATORY

## 2014-01-24 LAB — CBC
HEMATOCRIT: 40.1 % (ref 36.0–46.0)
Hemoglobin: 13.6 g/dL (ref 12.0–15.0)
MCH: 29.1 pg (ref 26.0–34.0)
MCHC: 33.9 g/dL (ref 30.0–36.0)
MCV: 85.9 fL (ref 78.0–100.0)
Platelets: 317 10*3/uL (ref 150–400)
RBC: 4.67 MIL/uL (ref 3.87–5.11)
RDW: 14.4 % (ref 11.5–15.5)
WBC: 14.1 10*3/uL — ABNORMAL HIGH (ref 4.0–10.5)

## 2014-01-24 MED ORDER — HYDROCODONE-ACETAMINOPHEN 5-325 MG PO TABS
1.0000 | ORAL_TABLET | ORAL | Status: DC | PRN
  Administered 2014-01-24 – 2014-01-25 (×5): 2 via ORAL
  Filled 2014-01-24 (×5): qty 2

## 2014-01-24 NOTE — Progress Notes (Signed)
Note: This document was prepared with digital dictation and possible smart phrase technology. Any transcriptional errors that result from this process are unintentional.   Angel [name] [id number] DOB: 09-04-1956 DOA: 01/21/2014 PCP: No primary provider on file.  Brief narrative: 58 [facility]. [facility] with [facility]. Recent hospitlized 3/25-3/28 wioth COPD exacerbation, compliant with therapy, took azithro/prednisone-readmitted with 3-4 day h/o increasing cough, sputum, fever and mianly SOB Found on CXR to have HCAP R side in a setting of Prior Strep Pneumonia on Sputum culture from last hospital stay   Past medical history-As per Problem list Chart reviewed as below- reviewed  Consultants:   none  Procedures:  none  Antibiotics:  Aztreonam 4/16-4/18  Vancomycin 4/16-4/18   Subjective  C/o CP still. Breathing much better. Able to sleep through the night  No nausea vomiting Tolerating diet No sputum   Objective    Interim History:   Telemetry:    Objective: Filed Vitals:   01/23/14 2124 01/24/14 0004 01/24/14 0526 01/24/14 0836  BP: 136/92  134/81   Pulse: 85  78   Temp: 97.5 F (36.4 C)  97.7 F (36.5 C)   TempSrc: Oral  Oral   Resp: 22  20   Height:      Weight:      SpO2: 93% 93% 93% 91%    Intake/Output Summary (Last 24 hours) at 01/24/14 0858 Last data filed at 01/24/14 [id number]  Gross per 24 hour  Intake    120 ml  Output   2100 ml  Net  -1980 ml    Exam:  General: EOMI, NCAT Cardiovascular: s1 s2 no m/r/g Respiratory: wheezy decreased throughout no other issues Abdomen: soft, NT/ND Skinno le edema   Data Reviewed: Basic Metabolic Panel:  Recent Labs Lab 01/21/14 1310 01/22/14 0820  NA 140  --   K 4.2  --   CL 104  --   CO2 23  --   GLUCOSE 109*  --   BUN 18  --   CREATININE 0.68 0.66  CALCIUM 9.4  --    Liver Function Tests: No results found for this basename: AST, ALT, ALKPHOS, BILITOT, PROT, ALBUMIN,   in the last 168 hours No results found for this basename: LIPASE, AMYLASE,  in the last 168 hours No results found for this basename: AMMONIA,  in the last 168 hours CBC:  Recent Labs Lab 01/21/14 1310 01/22/14 0820 01/23/14 0530 01/24/14 0515  WBC 8.2 12.4* 11.9* 14.1*  HGB 14.1 13.6 12.9 13.6  HCT 41.1 39.3 38.7 40.1  MCV 86.2 85.2 86.0 85.9  PLT 280 274 294 317   Cardiac Enzymes: No results found for this basename: CKTOTAL, CKMB, CKMBINDEX, TROPONINI,  in the last 168 hours BNP: No components found with this basename: POCBNP,  CBG: No results found for this basename: GLUCAP,  in the last 168 hours  Recent Results (from the past 240 hour(s))  CULTURE, EXPECTORATED SPUTUM-ASSESSMENT     Status: None   Collection Time    01/22/14 10:36 AM      Result Value Ref Range Status   Specimen Description SPUTUM   Final   Special Requests NONE   Final   Sputum evaluation     Final   Value: THIS SPECIMEN IS ACCEPTABLE. RESPIRATORY CULTURE REPORT TO FOLLOW.   Report Status 01/22/2014 FINAL   Final  CULTURE, RESPIRATORY (NON-EXPECTORATED)     Status: None   Collection Time    01/22/14 10:36 AM  Result Value Ref Range Status   Specimen Description SPUTUM   Final   Special Requests NONE   Final   Gram Stain     Final   Value: RARE WBC PRESENT, PREDOMINANTLY PMN     RARE SQUAMOUS EPITHELIAL CELLS PRESENT     RARE GRAM POSITIVE COCCI IN PAIRS     Performed at [facility]   Culture     Final   Value: NORMAL OROPHARYNGEAL FLORA     Performed at [facility]   Report Status PENDING   Incomplete     Studies:              All Imaging reviewed and is as per above notation   Scheduled Meds: . albuterol  2.5 mg Nebulization Q4H  . dextromethorphan  30 mg Oral BID  . heparin  5,000 Units Subcutaneous 3 times per day  . levofloxacin  500 mg Oral Daily  . nicotine  7 mg Transdermal Daily  . predniSONE  60 mg Oral QAC breakfast   Continuous Infusions:     Assessment/Plan: 1. HCAP-continue empiric coverage-negative strep pneumo/legionella/sputum cult is still pending however-narrowed to PO levaquin for 7 to 10 days as Strep PNA from last visit was sensitive to this. No WBC no fever or chills 2. COPD exacerbation-continue Steroids Prednisone 60 mg daily for a 5-7 day burst.  continue albuterol nebs q 4 prn-given increasing cough and pain added delsym 30 mg bid.  Continue atrovent 0.5 q4 prn.  NO O2 req now.  OOB to chair and ambulate/shower prn 3. Chest pain probably musculoskeletal. Is already on tramadol for this. Added Vicodin. If persists will get x-ray to rule out occult chest injury. Very low likelihood that this is cardiac related  Code Status: Full Family Communication:  None bedside Disposition Plan: inpatient   Pleas KochJai Hillary Struss, MD  [facility] Pager [phone] 01/24/2014, 8:58 AM    LOS: 3 days

## 2014-01-25 MED ORDER — ALBUTEROL SULFATE (2.5 MG/3ML) 0.083% IN NEBU: 2.5000 mg | INHALATION_SOLUTION | RESPIRATORY_TRACT | Status: DC | PRN

## 2014-01-25 MED ORDER — IPRATROPIUM BROMIDE 0.02 % IN SOLN: 0.5000 mg | RESPIRATORY_TRACT | Status: DC | PRN

## 2014-01-25 MED ORDER — DEXTROMETHORPHAN POLISTIREX 30 MG/5ML PO LQCR
30.0000 mg | Freq: Two times a day (BID) | ORAL | Status: DC
Start: 2014-01-25 — End: 2014-03-05

## 2014-01-25 MED ORDER — HYDROCODONE-ACETAMINOPHEN 5-325 MG PO TABS: 1.0000 | ORAL_TABLET | ORAL | Status: DC | PRN

## 2014-01-25 MED ORDER — LEVOFLOXACIN 500 MG PO TABS: 500.0000 mg | ORAL_TABLET | Freq: Every day | ORAL | Status: DC

## 2014-01-25 MED ORDER — ALBUTEROL SULFATE (2.5 MG/3ML) 0.083% IN NEBU
2.5000 mg | INHALATION_SOLUTION | Freq: Four times a day (QID) | RESPIRATORY_TRACT | Status: DC
  Administered 2014-01-25: 2.5 mg via RESPIRATORY_TRACT
  Filled 2014-01-25: qty 3

## 2014-01-25 MED ORDER — PREDNISONE 20 MG PO TABS: 60.0000 mg | ORAL_TABLET | Freq: Every day | ORAL | Status: DC

## 2014-01-25 MED ORDER — DOXYCYCLINE HYCLATE 50 MG PO CAPS: 50.0000 mg | ORAL_CAPSULE | Freq: Two times a day (BID) | ORAL | Status: DC

## 2014-01-25 NOTE — Discharge Summary (Signed)
Physician Discharge Summary  Angel [name] [id number] DOB: [birth year] DOA: 01/21/2014  PCP: No primary provider on file.  Admit date: 01/21/2014 Discharge date: 01/25/2014  Time spent: 34 minutes  Recommendations for Outpatient Follow-up:  1. Recommend CXR to denotes clearing PNa and ensure no lung CA as smoker 2. Continue Delsym for cough 3. Given burst of prednisone + to complete this and levaquin to complete 4/25 4. Will need PCP follow-up as scheduled 5. Limited Rx vicodin given for Chest pain fro cough/pluerisy  Discharge Diagnoses:  Active Problems:   COPD exacerbation   HCAP (healthcare-associated pneumonia)   Discharge Condition: good  Diet recommendation: reg  Filed Weights   01/21/14 1306  Weight: 69.6 kg (153 lb 7 oz)    History of present illness:  58 [facility]. [facility]. Recent hospitlized 3/25-3/28 wioth COPD exacerbation, compliant with therapy, took azithro/prednisone-readmitted with 3-4 day h/o increasing cough, sputum, fever and mianly SOB  Found on CXR to have HCAP R side in a setting of Prior Strep Pneumonia on Sputum culture from last hospital stay   Hospital Course:   1. HCAP-continue empiric coverage-negative strep pneumo/legionella/sputum cult is still pending however-narrowed to PO levaquin for 7 more days on 4/19 as Strep PNA from last visit was sensitive to this. No WBC no fever or chills 2. COPD exacerbation-continue Steroids Prednisone 60 mg daily for a 5-7 day burst. continue albuterol nebs q 4 prn-given increasing cough and pain added delsym 30 mg bid. Continue atrovent 0.5 q4 prn. NO O2 req now. OOB to chair and ambulate/shower prn 3. Chest pain probably musculoskeletal. Is already on tramadol for this. Added Vicodin and encouraged to take NSAIDs as OP in between.  Will need CXR in 1 month   Consultants:  none Procedures:  none Antibiotics:  Aztreonam 4/16-4/18  Vancomycin 4/16-4/18 Levaquin  4/19-4/23    Discharge Exam: Filed Vitals:   01/25/14 0535  BP: 114/80  Pulse: 69  Temp: 97.6 F (36.4 C)  Resp: 18    General: EOMI, NCAT Cardiovascular: s1 s2 no m/r/g Respiratory: Clearer Bs's without any TVR/TVF  Discharge Instructions You were cared for by a hospitalist during your hospital stay. If you have any questions about your discharge medications or the care you received while you were in the hospital after you are discharged, you can call the unit and asked to speak with the hospitalist on call if the hospitalist that took care of you is not available. Once you are discharged, your primary care physician will handle any further medical issues. Please note that NO REFILLS for any discharge medications will be authorized once you are discharged, as it is imperative that you return to your primary care physician (or establish a relationship with a primary care physician if you do not have one) for your aftercare needs so that they can reassess your need for medications and monitor your lab values.   Future Appointments Provider Department Dept Phone   02/19/2014 12:00 PM Doris Cheadleeepak Advani, MD [facility] [phone]       Medication List    STOP taking these medications       guaiFENesin 600 MG 12 hr tablet  Commonly known as:  MUCINEX     predniSONE 10 MG tablet  Commonly known as:  STERAPRED UNI-PAK  Replaced by:  predniSONE 20 MG tablet      TAKE these medications       albuterol 108 (90 BASE) MCG/ACT  inhaler  Commonly known as:  PROVENTIL HFA;VENTOLIN HFA  Inhale 1-2 puffs into the lungs every 6 (six) hours as needed for wheezing or shortness of breath.     albuterol (2.5 MG/3ML) 0.083% nebulizer solution  Commonly known as:  PROVENTIL  Take 3 mLs (2.5 mg total) by nebulization every 2 (two) hours as needed for wheezing or shortness of breath.     dextromethorphan 30 MG/5ML liquid  Commonly known as:  DELSYM  Take 5 mLs (30 mg  total) by mouth 2 (two) times daily.     HYDROcodone-acetaminophen 5-325 MG per tablet  Commonly known as:  NORCO/VICODIN  Take 1-2 tablets by mouth every 4 (four) hours as needed for moderate pain.     ipratropium 0.02 % nebulizer solution  Commonly known as:  ATROVENT  Take 2.5 mLs (0.5 mg total) by nebulization every 4 (four) hours as needed for wheezing or shortness of breath.     levofloxacin 500 MG tablet  Commonly known as:  LEVAQUIN  Take 1 tablet (500 mg total) by mouth daily.     nicotine 7 mg/24hr patch  Commonly known as:  NICODERM CQ - dosed in mg/24 hr  Place 1 patch (7 mg total) onto the skin daily.     predniSONE 20 MG tablet  Commonly known as:  DELTASONE  Take 3 tablets (60 mg total) by mouth daily before breakfast.       Allergies  Allergen Reactions  . Penicillins Hives      The results of significant diagnostics from this hospitalization (including imaging, microbiology, ancillary and laboratory) are listed below for reference.    Significant Diagnostic Studies: Dg Chest Port 1 View  01/21/2014   CLINICAL DATA:  Left chest pain. Respiratory issues. Hypertension. Smoker  EXAM: PORTABLE CHEST - 1 VIEW  COMPARISON:  12/30/2013.  FINDINGS: The heart size and mediastinal contours are within normal limits. There is a new medial segment, possibly azygos lobe, right upper lobe infiltrate not present previously. No effusion or pneumothorax. Bones unremarkable.  IMPRESSION: Right upper lobe infiltrate is new from priors.  In this patient with history of smoking, follow-up chest x-rays recommended until complete resolution of infiltrate to exclude a proximal obstructing lesion.   Electronically Signed   By: Davonna Belling M.D.   On: 01/21/2014 13:20   Dg Chest Port 1 View  12/30/2013   CLINICAL DATA:  Shortness of breath, asthma  EXAM: PORTABLE CHEST - 1 VIEW  COMPARISON:  11/28/2009  FINDINGS: Cardiomediastinal silhouette is stable. No acute infiltrate or pleural  effusion. No pulmonary edema. Bony thorax is unremarkable.  IMPRESSION: No active disease.   Electronically Signed   By: Natasha Mead M.D.   On: 12/30/2013 13:32    Microbiology: Recent Results (from the past 240 hour(s))  CULTURE, EXPECTORATED SPUTUM-ASSESSMENT     Status: None   Collection Time    01/22/14 10:36 AM      Result Value Ref Range Status   Specimen Description SPUTUM   Final   Special Requests NONE   Final   Sputum evaluation     Final   Value: THIS SPECIMEN IS ACCEPTABLE. RESPIRATORY CULTURE REPORT TO FOLLOW.   Report Status 01/22/2014 FINAL   Final  CULTURE, RESPIRATORY (NON-EXPECTORATED)     Status: None   Collection Time    01/22/14 10:36 AM      Result Value Ref Range Status   Specimen Description SPUTUM   Final   Special Requests NONE   Final  Gram Stain     Final   Value: RARE WBC PRESENT, PREDOMINANTLY PMN     RARE SQUAMOUS EPITHELIAL CELLS PRESENT     RARE GRAM POSITIVE COCCI IN PAIRS     Performed at [facility]   Culture     Final   Value: NORMAL OROPHARYNGEAL FLORA     Performed at [facility]   Report Status 01/24/2014 FINAL   Final     Labs: Basic Metabolic Panel:  Recent Labs Lab 01/21/14 1310 01/22/14 0820  NA 140  --   K 4.2  --   CL 104  --   CO2 23  --   GLUCOSE 109*  --   BUN 18  --   CREATININE 0.68 0.66  CALCIUM 9.4  --    Liver Function Tests: No results found for this basename: AST, ALT, ALKPHOS, BILITOT, PROT, ALBUMIN,  in the last 168 hours No results found for this basename: LIPASE, AMYLASE,  in the last 168 hours No results found for this basename: AMMONIA,  in the last 168 hours CBC:  Recent Labs Lab 01/21/14 1310 01/22/14 0820 01/23/14 0530 01/24/14 0515  WBC 8.2 12.4* 11.9* 14.1*  HGB 14.1 13.6 12.9 13.6  HCT 41.1 39.3 38.7 40.1  MCV 86.2 85.2 86.0 85.9  PLT 280 274 294 317   Cardiac Enzymes: No results found for this basename: CKTOTAL, CKMB, CKMBINDEX, TROPONINI,  in the last 168  hours BNP: BNP (last 3 results)  Recent Labs  01/21/14 1310  PROBNP 55.7   CBG: No results found for this basename: GLUCAP,  in the last 168 hours     Signed:  Jai-Gurmukh Annie Saephan  [facility] 01/25/2014, 10:08 AM

## 2014-02-19 ENCOUNTER — Encounter: Payer: Self-pay | Admitting: Internal Medicine

## 2014-02-19 ENCOUNTER — Ambulatory Visit: Payer: Self-pay | Attending: Internal Medicine | Admitting: Internal Medicine

## 2014-02-19 VITALS — BP 140/88 | HR 91 | Temp 97.6°F | Resp 16 | Wt 161.6 lb

## 2014-02-19 DIAGNOSIS — Z9109 Other allergy status, other than to drugs and biological substances: Secondary | ICD-10-CM | POA: Insufficient documentation

## 2014-02-19 DIAGNOSIS — R030 Elevated blood-pressure reading, without diagnosis of hypertension: Secondary | ICD-10-CM | POA: Insufficient documentation

## 2014-02-19 DIAGNOSIS — F17200 Nicotine dependence, unspecified, uncomplicated: Secondary | ICD-10-CM | POA: Insufficient documentation

## 2014-02-19 DIAGNOSIS — J449 Chronic obstructive pulmonary disease, unspecified: Secondary | ICD-10-CM | POA: Insufficient documentation

## 2014-02-19 DIAGNOSIS — IMO0001 Reserved for inherently not codable concepts without codable children: Secondary | ICD-10-CM | POA: Insufficient documentation

## 2014-02-19 DIAGNOSIS — Z1211 Encounter for screening for malignant neoplasm of colon: Secondary | ICD-10-CM | POA: Insufficient documentation

## 2014-02-19 DIAGNOSIS — J4489 Other specified chronic obstructive pulmonary disease: Secondary | ICD-10-CM | POA: Insufficient documentation

## 2014-02-19 DIAGNOSIS — Z720 Tobacco use: Secondary | ICD-10-CM

## 2014-02-19 DIAGNOSIS — Z139 Encounter for screening, unspecified: Secondary | ICD-10-CM | POA: Insufficient documentation

## 2014-02-19 DIAGNOSIS — J189 Pneumonia, unspecified organism: Secondary | ICD-10-CM | POA: Insufficient documentation

## 2014-02-19 LAB — COMPLETE METABOLIC PANEL WITH GFR
ALT: 21 U/L (ref 0–35)
AST: 17 U/L (ref 0–37)
Albumin: 4.2 g/dL (ref 3.5–5.2)
Alkaline Phosphatase: 95 U/L (ref 39–117)
BILIRUBIN TOTAL: 0.4 mg/dL (ref 0.2–1.2)
BUN: 16 mg/dL (ref 6–23)
CHLORIDE: 102 meq/L (ref 96–112)
CO2: 24 meq/L (ref 19–32)
Calcium: 9.6 mg/dL (ref 8.4–10.5)
Creat: 0.79 mg/dL (ref 0.50–1.10)
GFR, Est Non African American: 85 mL/min
Glucose, Bld: 101 mg/dL — ABNORMAL HIGH (ref 70–99)
POTASSIUM: 4.1 meq/L (ref 3.5–5.3)
SODIUM: 139 meq/L (ref 135–145)
TOTAL PROTEIN: 6.7 g/dL (ref 6.0–8.3)

## 2014-02-19 LAB — CBC WITH DIFFERENTIAL/PLATELET
Basophils Absolute: 0 10*3/uL (ref 0.0–0.1)
Basophils Relative: 0 % (ref 0–1)
EOS ABS: 0.3 10*3/uL (ref 0.0–0.7)
Eosinophils Relative: 4 % (ref 0–5)
HCT: 40.9 % (ref 36.0–46.0)
Hemoglobin: 14.4 g/dL (ref 12.0–15.0)
LYMPHS ABS: 2.3 10*3/uL (ref 0.7–4.0)
LYMPHS PCT: 32 % (ref 12–46)
MCH: 29.6 pg (ref 26.0–34.0)
MCHC: 35.2 g/dL (ref 30.0–36.0)
MCV: 84 fL (ref 78.0–100.0)
Monocytes Absolute: 0.6 10*3/uL (ref 0.1–1.0)
Monocytes Relative: 9 % (ref 3–12)
NEUTROS PCT: 55 % (ref 43–77)
Neutro Abs: 4 10*3/uL (ref 1.7–7.7)
Platelets: 313 10*3/uL (ref 150–400)
RBC: 4.87 MIL/uL (ref 3.87–5.11)
RDW: 14.9 % (ref 11.5–15.5)
WBC: 7.2 10*3/uL (ref 4.0–10.5)

## 2014-02-19 LAB — LIPID PANEL
Cholesterol: 260 mg/dL — ABNORMAL HIGH (ref 0–200)
HDL: 58 mg/dL (ref >39)
TRIGLYCERIDES: 503 mg/dL — AB (ref <150)
Total CHOL/HDL Ratio: 4.5 Ratio

## 2014-02-19 MED ORDER — FLUTICASONE-SALMETEROL 100-50 MCG/DOSE IN AEPB: 1.0000 | INHALATION_SPRAY | Freq: Two times a day (BID) | RESPIRATORY_TRACT | Status: DC

## 2014-02-19 NOTE — Progress Notes (Signed)
Patient Demographics  Angel [name], is a 58 y.o. female  ZOX:[id number]  [id number]  DOB - Oct 05, 1956  CC:  Chief Complaint  Patient presents with  . Hospitalization Follow-up       HPI: Angel [name] is a 58 y.o. female here today to establish medical care.she Has History of COPD, last month she was hospitalized with pneumonia and COPD exacerbation, EMR reviewed her patient was treated with antibiotic prednisone albuterol nebulization patient also had pleuritic chest pain and was taking pain medication, she already completed a course of antibiotic. Patient is still trying to quit smoking, denies any fever chills has some occasional wheezing, patient reported she was on Advair in the past which helped her with the symptoms, she used albuterol today in the morning. Patient also reported to have environmental allergies and have used OTC Zyrtec in the past. Patient has No headache, No chest pain, No abdominal pain - No Nausea, No new weakness tingling or numbness, No Cough - SOB.  Allergies  Allergen Reactions  . Penicillins Hives   Past Medical History  Diagnosis Date  . Asthma   . Bronchiolitis    Current Outpatient Prescriptions on File Prior to Visit  Medication Sig Dispense Refill  . albuterol (PROVENTIL HFA;VENTOLIN HFA) 108 (90 BASE) MCG/ACT inhaler Inhale 1-2 puffs into the lungs every 6 (six) hours as needed for wheezing or shortness of breath.  1 Inhaler  3  . albuterol (PROVENTIL) (2.5 MG/3ML) 0.083% nebulizer solution Take 3 mLs (2.5 mg total) by nebulization every 2 (two) hours as needed for wheezing or shortness of breath.  75 mL  12  . dextromethorphan (DELSYM) 30 MG/5ML liquid Take 5 mLs (30 mg total) by mouth 2 (two) times daily.  89 mL  0  . doxycycline (VIBRAMYCIN) 50 MG capsule Take 1 capsule (50 mg total) by mouth 2 (two) times daily.  10 capsule  0  . HYDROcodone-acetaminophen (NORCO/VICODIN) 5-325 MG per tablet Take 1-2 tablets by mouth every 4 (four)  hours as needed for moderate pain.  30 tablet  0  . ipratropium (ATROVENT) 0.02 % nebulizer solution Take 2.5 mLs (0.5 mg total) by nebulization every 4 (four) hours as needed for wheezing or shortness of breath.  75 mL  12  . nicotine (NICODERM CQ - DOSED IN MG/24 HR) 7 mg/24hr patch Place 1 patch (7 mg total) onto the skin daily.  28 patch  2  . predniSONE (DELTASONE) 20 MG tablet Take 3 tablets (60 mg total) by mouth daily before breakfast.  15 tablet  0   No current facility-administered medications on file prior to visit.   Family History  Problem Relation Age of Onset  . CAD Father   . Heart disease Father   . Colon cancer Mother   . Cancer Sister    History   Social History  . Marital Status: Divorced    Spouse Name: N/A    Number of Children: N/A  . Years of Education: N/A   Occupational History  . Not on file.   Social History Main Topics  . Smoking status: Current Every Day Smoker -- 30 years  . Smokeless tobacco: Never Used     Comment: currently trying to stop smoking  . Alcohol Use: No  . Drug Use: No  . Sexual Activity: Not Currently   Other Topics Concern  . 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 HENT: Negative for ear pain, nosebleeds, congestion, facial swelling, rhinorrhea, neck pain, neck stiffness and ear discharge.  Eyes: Negative for pain, discharge, redness, itching and visual disturbance. Respiratory: Negative for cough, choking, chest tightness, shortness of breath, wheezing and stridor.  Cardiovascular: Negative for chest pain, palpitations and leg swelling. Gastrointestinal: Negative for abdominal distention. Genitourinary: Negative for  dysuria, urgency, frequency, hematuria, flank pain, decreased urine volume, difficulty urinating and dyspareunia.  Musculoskeletal: Negative for back pain, joint swelling, arthralgia and gait problem. Neurological: Negative for dizziness, tremors, seizures, syncope, facial asymmetry, speech difficulty, weakness, light-headedness, numbness and headaches.  Hematological: Negative for adenopathy. Does not bruise/bleed easily. Psychiatric/Behavioral: Negative for hallucinations, behavioral problems, confusion, dysphoric mood, decreased concentration and agitation.    Objective:   Filed Vitals:   02/19/14 1146  BP: 140/88  Pulse: 91  Temp: 97.6 F (36.4 C)  Resp: 16    Physical Exam: Constitutional: Patient appears well-developed and well-nourished. No distress. HENT: Normocephalic, atraumatic, External right and left ear normal. Oropharynx is clear and moist.  Eyes: Conjunctivae and EOM are normal. PERRLA, no scleral icterus. Neck: Normal ROM. Neck supple. No JVD. No tracheal deviation. No thyromegaly. CVS: RRR, S1/S2 +, no murmurs, no gallops, no carotid bruit.  Pulmonary: Effort and breath sounds normal, no stridor, rhonchi, + minimal wheezes, rales.  Abdominal: Soft. BS +, no distension, tenderness, rebound or guarding.  Musculoskeletal: Normal range of motion. No edema and no tenderness.  Neuro: Alert. Normal reflexes, muscle tone coordination. No cranial nerve deficit. Skin: Skin is warm and dry. No rash noted. Not diaphoretic. No erythema. No pallor. Psychiatric: Normal mood and affect. Behavior, judgment, thought content normal.  Lab Results  Component Value Date   WBC 14.1* 01/24/2014   HGB 13.6 01/24/2014   HCT 40.1 01/24/2014   MCV 85.9 01/24/2014   PLT 317 01/24/2014   Lab Results  Component Value Date   CREATININE 0.66 01/22/2014   BUN 18 01/21/2014   NA 140 01/21/2014   K 4.2 01/21/2014   CL 104 01/21/2014   CO2 23 01/21/2014    No results found for this basename:  HGBA1C   Lipid Panel  No results found for this basename: chol, trig, hdl, cholhdl, vldl, ldlcalc       Assessment and plan:   1. COPD (chronic obstructive pulmonary disease) Have started patient on - Fluticasone-Salmeterol (ADVAIR) 100-50 MCG/DOSE AEPB; Inhale 1 puff into the lungs 2 (two) times daily.  Dispense: 1 each; Refill: 3  User return when necessary 2. Pneumonia Will repeat chest x-ray.  - DG Chest 2 View; Future  3. Tobacco abuse Patient is trying to quit smoking a nicotine patch.  4. Screening Ordered baseline blood work. - CBC with Differential - COMPLETE METABOLIC PANEL WITH GFR - TSH - Lipid panel - Vit D  25 hydroxy (rtn osteoporosis monitoring) - MM DIGITAL SCREENING BILATERAL; Future  5. Special screening for malignant neoplasms, colon  - Ambulatory referral to Gastroenterology  6. Elevated BP Advised patient for DASH diet.  7. Environmental allergies Zyrtec when necessary.         Health Maintenance -Colonoscopy: Referred to GI  -Mammogram: Ordered   Return in about 3 months (around 05/22/2014) for COPD.   Kynslee Baham,  MD

## 2014-02-19 NOTE — Progress Notes (Signed)
Patient here for hospital follow up Was admitted for pneumonia Currently only using the albuterol inhaler

## 2014-02-20 LAB — VITAMIN D 25 HYDROXY (VIT D DEFICIENCY, FRACTURES): Vit D, 25-Hydroxy: 19 ng/mL — ABNORMAL LOW (ref 30–89)

## 2014-02-20 LAB — TSH: TSH: 1.252 u[IU]/mL (ref 0.350–4.500)

## 2014-02-22 ENCOUNTER — Ambulatory Visit (HOSPITAL_COMMUNITY)
Admission: RE | Admit: 2014-02-22 | Discharge: 2014-02-22 | Disposition: A | Discharge status: Home / Self Care | Payer: Self-pay | Source: Ambulatory Visit | Attending: Internal Medicine | Admitting: Internal Medicine

## 2014-02-22 ENCOUNTER — Telehealth: Payer: Self-pay

## 2014-02-22 ENCOUNTER — Other Ambulatory Visit: Payer: Self-pay | Admitting: Internal Medicine

## 2014-02-22 DIAGNOSIS — J189 Pneumonia, unspecified organism: Secondary | ICD-10-CM | POA: Insufficient documentation

## 2014-02-22 DIAGNOSIS — J40 Bronchitis, not specified as acute or chronic: Secondary | ICD-10-CM | POA: Insufficient documentation

## 2014-02-22 DIAGNOSIS — R9389 Abnormal findings on diagnostic imaging of other specified body structures: Secondary | ICD-10-CM

## 2014-02-22 MED ORDER — FENOFIBRATE 145 MG PO TABS: 145.0000 mg | ORAL_TABLET | Freq: Every day | ORAL | Status: DC

## 2014-02-22 MED ORDER — VITAMIN D (ERGOCALCIFEROL) 1.25 MG (50000 UNIT) PO CAPS: 50000.0000 [IU] | ORAL_CAPSULE | ORAL | Status: DC

## 2014-02-22 NOTE — Telephone Encounter (Signed)
Message copied by Lestine MountJUAREZ, Colisha Redler L on Mon Feb 22, 2014 12:50 PM ------      Message from: Doris CheadleADVANI, DEEPAK      Created: Mon Feb 22, 2014  9:24 AM       Blood work reviewed, noticed low vitamin D, call patient advise to start ergocalciferol 50,000 units once a week for the duration of  12 weeks.      Also , noticed elevated triglycerides, advise patient for low fat diet. And start taking fenofibrate 145 mg daily             ------

## 2014-02-22 NOTE — Telephone Encounter (Signed)
Called patient to give her her x ray results Patient not available Left message on voice mail to return our call CT scan scheduled for this wed  5/20 at 9:30 am

## 2014-02-22 NOTE — Telephone Encounter (Signed)
Patient not available Left message on voice mail to return our call Work number is not a valid number

## 2014-02-22 NOTE — Telephone Encounter (Signed)
Patient returned phone call and is aware of her lab results and  To pick up her prescriptions at the pharmacy

## 2014-02-24 ENCOUNTER — Ambulatory Visit (HOSPITAL_COMMUNITY)
Admission: RE | Admit: 2014-02-24 | Discharge: 2014-02-24 | Disposition: A | Discharge status: Home / Self Care | Payer: Self-pay | Source: Ambulatory Visit | Attending: Internal Medicine | Admitting: Internal Medicine

## 2014-02-24 DIAGNOSIS — R9389 Abnormal findings on diagnostic imaging of other specified body structures: Secondary | ICD-10-CM

## 2014-02-24 DIAGNOSIS — J9819 Other pulmonary collapse: Secondary | ICD-10-CM | POA: Insufficient documentation

## 2014-02-24 MED ORDER — IOHEXOL 300 MG/ML  SOLN
75.0000 mL | Freq: Once | INTRAMUSCULAR | Status: AC | PRN
  Administered 2014-02-24: 75 mL via INTRAVENOUS

## 2014-02-25 ENCOUNTER — Telehealth: Payer: Self-pay | Admitting: *Deleted

## 2014-02-25 DIAGNOSIS — J441 Chronic obstructive pulmonary disease with (acute) exacerbation: Secondary | ICD-10-CM

## 2014-02-25 NOTE — Telephone Encounter (Signed)
Patient called wanting to know the results of her CT scan. Informed the patient that her her CT scan reported right upper lobe collapse with ? Bronchiectasis and has lymph node positive. Informed her Dr. Orpah CobbAdvani advises patient to to be evaluated by a pulmonologist. Patient agreed. Referral to Pulmonologist entered. Reather LaurenceJamie R Alanie Syler, RN

## 2014-03-05 ENCOUNTER — Encounter: Payer: Self-pay | Admitting: Internal Medicine

## 2014-03-05 ENCOUNTER — Ambulatory Visit (INDEPENDENT_AMBULATORY_CARE_PROVIDER_SITE_OTHER): Payer: Self-pay | Admitting: Internal Medicine

## 2014-03-05 VITALS — BP 122/70 | HR 78 | Temp 98.0°F | Ht 67.0 in | Wt 158.0 lb

## 2014-03-05 DIAGNOSIS — F17200 Nicotine dependence, unspecified, uncomplicated: Secondary | ICD-10-CM

## 2014-03-05 DIAGNOSIS — J449 Chronic obstructive pulmonary disease, unspecified: Secondary | ICD-10-CM

## 2014-03-05 DIAGNOSIS — J479 Bronchiectasis, uncomplicated: Secondary | ICD-10-CM

## 2014-03-05 MED ORDER — FLUTICASONE-SALMETEROL 250-50 MCG/DOSE IN AEPB: 1.0000 | INHALATION_SPRAY | Freq: Two times a day (BID) | RESPIRATORY_TRACT | Status: DC

## 2014-03-05 NOTE — Progress Notes (Signed)
Subjective:    Patient ID: Angel [name], female    DOB: [birth year]   MRN: [id number]  HPI   [age] yowf smoker  with hx asthma/pna as child never really 100% but no need maintenance rx or speciliast eval then started smoking and did fine until 40's with sob/ intermttent cough started using inhalers referred 03/05/2014 to pulmonary clinic by Dr Angel [name]   with [facility]nd [facility] and abd ct Chest 02/24/14   Admit date: 01/21/2014  Discharge date: 01/25/2014  Recommendations for Outpatient Follow-up:  1. Recommend CXR to denotes clearing PNa and ensure no lung CA as smoker 2. Continue Delsym for cough 3. Given burst of prednisone + to complete this and levaquin to complete 4/25 4. Will need PCP follow-up as scheduled 5. Limited Rx vicodin given for Chest pain fro cough/pluerisy Discharge Diagnoses:   COPD exacerbation  HCAP (healthcare-associated pneumonia)  Discharge Condition: good  Diet recommendation: reg  Filed Weights    01/21/14 1306   Weight:  69.6 kg (153 lb 7 oz)   History of present illness:  58 y.o. female [facility] with cough. Recent hospitlized 3/25-3/28 wioth COPD exacerbation, compliant with therapy, took azithro/prednisone-readmitted with 3-4 day h/o increasing cough, sputum, fever and mianly SOB  Found on CXR to have HCAP R side in a setting of Prior Strep Pneumonia on Sputum culture from last hospital stay  Hospital Course:  1. HCAP-continue empiric coverage-negative strep pneumo/legionella/sputum cult is still pending however-narrowed to PO levaquin for 7 more days on 4/19 as Strep PNA from last visit was sensitive to this. No WBC no fever or chills 2. COPD exacerbation-continue Steroids Prednisone 60 mg daily for a 5-7 day burst. continue albuterol nebs q 4 prn-given increasing cough and pain added delsym 30 mg bid. Continue atrovent 0.5 q4 prn. NO O2 req now. OOB to chair and ambulate/shower prn 3. Chest pain probably musculoskeletal. Is already on tramadol for  this. Added Vicodin and encouraged to take NSAIDs as OP in between. Will need CXR in 1 month Consultants:  none Procedures:  none Antibiotics:  Aztreonam 4/16-4/18  Vancomycin 4/16-4/18  Levaquin 4/19-4/23      03/05/2014 1st [facility] office visit/ Angel [name]/ advair 100 one  Bid  Chief Complaint  Patient presents with  . Pulmonary Consult    Referred per Dr. Shella Maximeepak [name] for eval of abnormal ct chest. Pt c/o SOB and cough since Feb 2015. She is SOB climbing stairs. Cough is minimal.   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 No unusual exp hx or knowledge of premature birth.  Sleeping ok without nocturnal  or early am exacerbation  of respiratory  c/o's or need for noct saba. Also denies any obvious fluctuation of symptoms with weather or environmental changes or other aggravating or alleviating factors except as outlined above   Current Medications, Allergies, Complete Past Medical History, Past Surgical History, Family History, and Social History were reviewed in [facility] electronic medical record.  Review of Systems  Constitutional: Negative for fever, chills and unexpected weight change.  HENT: Positive for dental problem. Negative for congestion, ear pain, nosebleeds, postnasal drip, rhinorrhea, sinus pressure, sneezing, sore throat, trouble swallowing and voice change.   Eyes: Negative for visual disturbance.  Respiratory: Positive for shortness of breath. Negative for cough and choking.   Cardiovascular: Negative for chest pain and leg swelling.    Gastrointestinal: Negative for vomiting, abdominal pain and diarrhea.  Genitourinary: Negative for difficulty urinating.  Musculoskeletal: Positive for arthralgias.  Skin: Negative for rash.  Neurological: Negative for tremors, syncope and headaches.  Hematological: Does not bruise/bleed easily.       Objective:   Physical Exam  Wt Readings from Last 3 Encounters:  03/05/14 158 lb (71.668 kg)  02/19/14 161 lb 9.6 oz (73.301 kg)  01/21/14 153 lb 7 oz (69.6 kg)      HEENT mild turbinate edema.  Oropharynx no thrush or excess pnd or cobblestoning.  No JVD or cervical adenopathy. Mild accessory muscle hypertrophy. Trachea midline, nl thryroid. Chest was hyperinflated by percussion with diminished breath sounds and moderate increased exp time without wheeze. Hoover sign positive at mid inspiration. Regular rate and rhythm without murmur gallop or rub or increase P2 or edema.  Abd: no hsm, nl excursion. Ext warm without cyanosis or clubbing.     CT 02/24/14 RIGHT upper lobe collapse with questionable bronchiectasis in the  RIGHT upper lobe.  Remaining lungs demonstrate peribronchial thickening but are  otherwise clear.  Single borderline enlarged LEFT cardiophrenic angle lymph node 10 mm  short axis. My review  ? Subtle air bronchgrams in atx segment apical segment RUL      Assessment & Plan:   Outpatient Encounter Prescriptions as of 03/05/2014  Medication Sig  . albuterol (PROVENTIL HFA;VENTOLIN HFA) 108 (90 BASE) MCG/ACT inhaler Inhale 1-2 puffs into the lungs every 6 (six) hours as needed for wheezing or shortness of breath.  Marland Kitchen albuterol (PROVENTIL) (2.5 MG/3ML) 0.083% nebulizer solution Take 3 mLs (2.5 mg total) by nebulization every 2 (two) hours as needed for wheezing or shortness of breath.  . dextromethorphan (DELSYM) 30 MG/5ML liquid Take 30 mg by mouth as needed.  Marland Kitchen ipratropium (ATROVENT) 0.02 % nebulizer solution Take 2.5 mLs (0.5 mg total) by nebulization every 4 (four)  hours as needed for wheezing or shortness of breath.  . nicotine (NICODERM CQ - DOSED IN MG/24 HR) 7 mg/24hr patch Place 1 patch (7 mg total) onto the skin daily.  . [DISCONTINUED] dextromethorphan (DELSYM) 30 MG/5ML liquid Take 5 mLs (30 mg total) by mouth 2 (two) times daily.  . [DISCONTINUED] Fluticasone-Salmeterol (ADVAIR) 100-50 MCG/DOSE AEPB Inhale 1 puff into the lungs 2 (two) times daily.  . Fluticasone-Salmeterol (ADVAIR) 250-50 MCG/DOSE AEPB Inhale 1 puff into the lungs every 12 (twelve) hours.  . [DISCONTINUED] doxycycline (VIBRAMYCIN) 50 MG capsule Take 1 capsule (50 mg total) by mouth 2 (two) times daily.  . [DISCONTINUED] fenofibrate (TRICOR) 145 MG tablet Take 1 tablet (145 mg total) by mouth daily.  . [DISCONTINUED] HYDROcodone-acetaminophen (NORCO/VICODIN) 5-325 MG per tablet Take 1-2 tablets by mouth every 4 (four) hours as needed for moderate pain.  . [DISCONTINUED] predniSONE (DELTASONE) 20 MG tablet Take 3 tablets (60 mg total) by mouth daily before breakfast.  . [DISCONTINUED] Vitamin D, Ergocalciferol, (DRISDOL) 50000 UNITS CAPS capsule Take 1 capsule (50,000 Units total) by mouth every 7 (seven) days.

## 2014-03-05 NOTE — Patient Instructions (Signed)
Bronchiectasis =   you have scarring of your bronchial tubes which means that they don't function perfectly normally and mucus tends to pool in certain areas of your lung which can cause pneumonia and further scarring of your lung and bronchial tubes  Whenever you develop cough congestion take mucinex or mucinex dm > these will help keep the mucus loose and flowing but if your condition worsens you need to seek help immediately preferably here or somewhere inside the [facility] system to compare xrays ( worse = darker or bloody mucus or pain on breathing in)   Increase advair to 250 twice daily   Only use your albuterol (ventolin) as a rescue medication to be used if you can't catch your breath by resting or doing a relaxed purse lip breathing pattern.  - The less you use it, the better it will work when you need it. - Ok to use up to 2 puffs  every 4 hours if you must but call for immediate appointment if use goes up over your usual need - Don't leave home without it !!  (think of it like the spare tire for your car)   The key is to stop smoking completely before smoking completely stops you!   Please schedule a follow up office visit in 6 weeks, call sooner if needed with pfts and cxr

## 2014-03-06 DIAGNOSIS — J479 Bronchiectasis, uncomplicated: Secondary | ICD-10-CM | POA: Insufficient documentation

## 2014-03-06 NOTE — Assessment & Plan Note (Signed)

## 2014-03-06 NOTE — Assessment & Plan Note (Signed)
C/w h/o pna as child and assoc with infiltrate RUL with air bronchograms reassuring that's all that's going on here but low threshold to fob if any evoving RUL changes on plain cxr which at baseline was wnl    Pathophysiology reviewed -  See instructions for specific recommendations which were reviewed directly with the patient who was given a copy with highlighter outlining the key components.

## 2014-03-06 NOTE — Assessment & Plan Note (Signed)
DDX of  difficult airways managment all start with A and  include Adherence, Ace Inhibitors, Acid Reflux, Active Sinus Disease, Alpha 1 Antitripsin deficiency, Anxiety masquerading as Airways dz,  ABPA,  allergy(esp in young), Aspiration (esp in elderly), Adverse effects of DPI,  Active smokers, plus two Bs  = Bronchiectasis and Beta blocker use..and one C= CHF  Adherence is always the initial "prime suspect" and is a multilayered concern that requires a "trust but verify" approach in every patient - starting with knowing how to use medications, especially inhalers, correctly, keeping up with refills and understanding the fundamental difference between maintenance and prns vs those medications only taken for a very short course and then stopped and not refilled.   Active smoking greatest concern > see smoking a/p  ? Adverse effect of dpi > try double the dose of advair to 250 one bid to see if helps or worsens her symptoms - either is possible  Bronchiectasis > see sep a/p    Each maintenance medication was reviewed in detail including most importantly the difference between maintenance and as needed and under what circumstances the prns are to be used.  Please see instructions for details which were reviewed in writing and the patient given a copy.

## 2014-03-31 ENCOUNTER — Encounter: Payer: Self-pay | Admitting: Internal Medicine

## 2014-04-16 ENCOUNTER — Ambulatory Visit: Payer: Self-pay | Admitting: Internal Medicine

## 2014-05-20 ENCOUNTER — Ambulatory Visit: Payer: Self-pay | Admitting: Internal Medicine

## 2015-09-28 NOTE — Telephone Encounter (Signed)
error 

## 2016-02-21 IMAGING — CR DG CHEST 1V PORT
1 series · 1 of 1 positions shown · non-contrast
Comparison: 12/30/2013.

CLINICAL DATA: Left chest pain. Respiratory issues. Hypertension.
Smoker

EXAM:
PORTABLE CHEST - 1 VIEW

[AP]
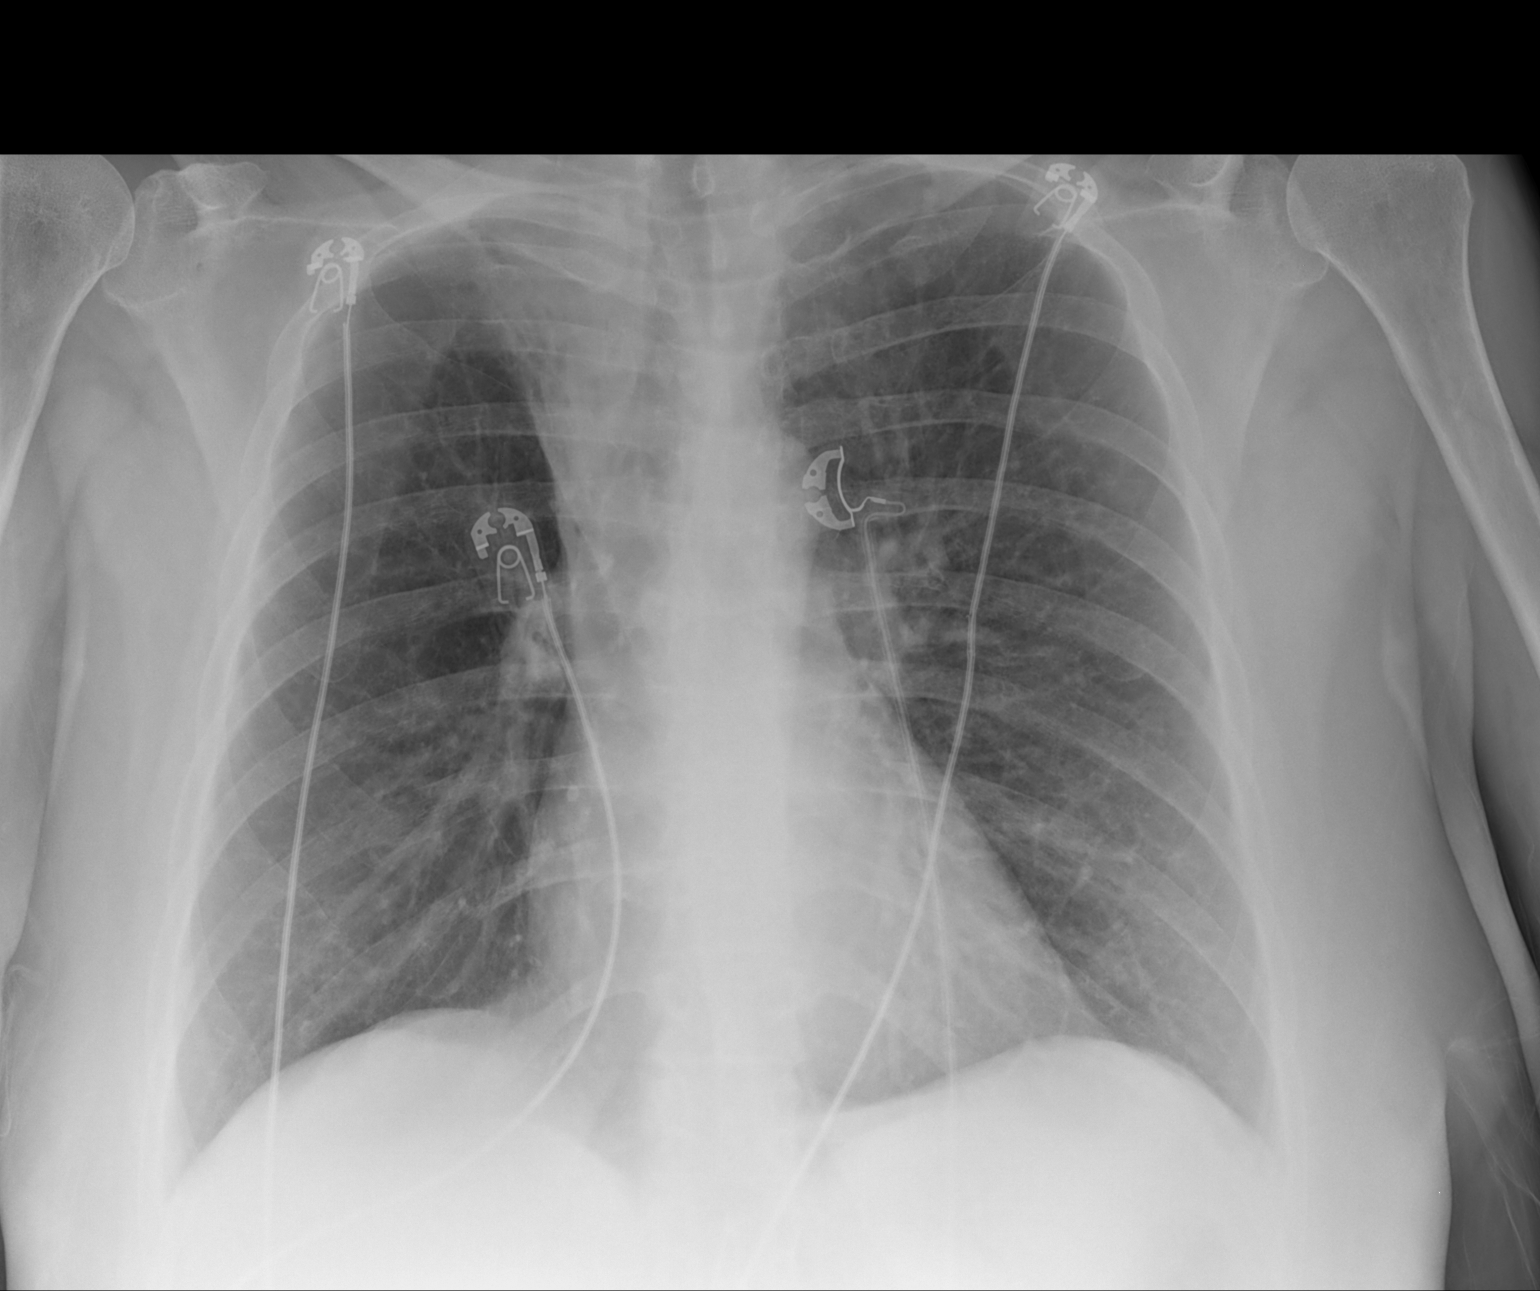

[1 of 1 positions shown; findings below may reference images not displayed]

FINDINGS: The heart size and mediastinal contours are within normal limits.
There is a new medial segment, possibly azygos lobe, right upper
lobe infiltrate not present previously. No effusion or pneumothorax.
Bones unremarkable.
IMPRESSION: Right upper lobe infiltrate is new from priors.

In this patient with history of smoking, follow-up chest x-rays
recommended until complete resolution of infiltrate to exclude a
proximal obstructing lesion.

## 2018-10-11 ENCOUNTER — Encounter (HOSPITAL_COMMUNITY): Payer: Self-pay | Admitting: Emergency Medicine

## 2018-10-11 ENCOUNTER — Emergency Department (HOSPITAL_COMMUNITY): Payer: Self-pay

## 2018-10-11 ENCOUNTER — Emergency Department (HOSPITAL_COMMUNITY)
Admission: EM | Admit: 2018-10-11 | Discharge: 2018-10-11 | Disposition: A | Discharge status: Home / Self Care | Payer: Self-pay | Attending: Emergency Medicine | Admitting: Emergency Medicine

## 2018-10-11 ENCOUNTER — Other Ambulatory Visit: Payer: Self-pay

## 2018-10-11 DIAGNOSIS — Z79899 Other long term (current) drug therapy: Secondary | ICD-10-CM | POA: Insufficient documentation

## 2018-10-11 DIAGNOSIS — R0789 Other chest pain: Secondary | ICD-10-CM | POA: Insufficient documentation

## 2018-10-11 DIAGNOSIS — Z87891 Personal history of nicotine dependence: Secondary | ICD-10-CM | POA: Insufficient documentation

## 2018-10-11 DIAGNOSIS — J449 Chronic obstructive pulmonary disease, unspecified: Secondary | ICD-10-CM | POA: Insufficient documentation

## 2018-10-11 LAB — BASIC METABOLIC PANEL
Anion gap: 9 (ref 5–15)
BUN: 15 mg/dL (ref 6–20)
CALCIUM: 9.3 mg/dL (ref 8.9–10.3)
CO2: 27 mmol/L (ref 22–32)
Chloride: 105 mmol/L (ref 98–111)
Creatinine, Ser: 0.84 mg/dL (ref 0.44–1.00)
GFR calc Af Amer: >60 mL/min (ref >60)
GFR calc non Af Amer: >60 mL/min (ref >60)
Glucose, Bld: 100 mg/dL — ABNORMAL HIGH (ref 70–99)
Potassium: 4.2 mmol/L (ref 3.5–5.1)
Sodium: 141 mmol/L (ref 135–145)

## 2018-10-11 LAB — CBC
HCT: 49.7 % — ABNORMAL HIGH (ref 36.0–46.0)
HEMOGLOBIN: 16.1 g/dL — AB (ref 12.0–15.0)
MCH: 29.9 pg (ref 26.0–34.0)
MCHC: 32.4 g/dL (ref 30.0–36.0)
MCV: 92.4 fL (ref 80.0–100.0)
Platelets: 324 10*3/uL (ref 150–400)
RBC: 5.38 MIL/uL — ABNORMAL HIGH (ref 3.87–5.11)
RDW: 13.5 % (ref 11.5–15.5)
WBC: 10.2 10*3/uL (ref 4.0–10.5)
nRBC: 0 % (ref 0.0–0.2)

## 2018-10-11 LAB — POCT I-STAT TROPONIN I: Troponin i, poc: 0 ng/mL (ref 0.00–0.08)

## 2018-10-11 MED ORDER — HYDROCODONE-ACETAMINOPHEN 5-325 MG PO TABS: 1.0000 | ORAL_TABLET | ORAL | 0 refills | Status: DC | PRN

## 2018-10-11 MED ORDER — DIAZEPAM 2 MG PO TABS: 2.0000 mg | ORAL_TABLET | Freq: Four times a day (QID) | ORAL | 0 refills | Status: DC | PRN

## 2018-10-11 MED ORDER — OXYCODONE-ACETAMINOPHEN 5-325 MG PO TABS
1.0000 | ORAL_TABLET | Freq: Once | ORAL | Status: AC
  Administered 2018-10-11: 1 via ORAL
  Filled 2018-10-11: qty 1

## 2018-10-11 MED ORDER — KETOROLAC TROMETHAMINE 60 MG/2ML IM SOLN
60.0000 mg | Freq: Once | INTRAMUSCULAR | Status: AC
  Administered 2018-10-11: 60 mg via INTRAMUSCULAR
  Filled 2018-10-11: qty 2

## 2018-10-11 MED ORDER — DIAZEPAM 5 MG PO TABS
10.0000 mg | ORAL_TABLET | Freq: Once | ORAL | Status: AC
  Administered 2018-10-11: 10 mg via ORAL
  Filled 2018-10-11: qty 2

## 2018-10-11 NOTE — ED Triage Notes (Addendum)
Patient c/o right rib pain worsening after "twisting in the car" on x3 days. Reports pain radiates to back and worsens with movement and inspiration. States pain is causing SOB and central chest pain. Speaking in full sentences.

## 2018-10-11 NOTE — ED Notes (Addendum)
Pt verbalized discharge instructions and follow up care. Aunt is picking her up. Alert and vitals stable. Ambulatory

## 2018-10-11 NOTE — ED Provider Notes (Signed)
[facility]-EMERGENCY DEPT Provider Note   CSN: [id number] Arrival date & time: 10/11/18  1447     History   Chief Complaint Chief Complaint  Patient presents with  . Shortness of Breath  . Chest Pain    HPI Angel [name] is a 63 y.o. female.  63 year old female presents with right-sided rib pain x3 days after she was reaching for something in her car.  Patient characterizes the pain as sharp and worse with any movement of her arm.  Denies any cough or congestion.  No anginal or CHF type symptoms.  Pain better with remaining still.  Denies any rashes to her skin.  Has used Motrin with limited relief.  No prior history of same     Past Medical History:  Diagnosis Date  . Asthma   . Bronchiolitis     Patient Active Problem List   Diagnosis Date Noted  . Bronchiectasis without acute exacerbation (HCC) 03/06/2014  . Pneumonia 02/19/2014  . Elevated BP 02/19/2014  . COPD (chronic obstructive pulmonary disease) (HCC) 02/19/2014  . Environmental allergies 02/19/2014  . COPD exacerbation (HCC) 01/21/2014  . HCAP (healthcare-associated pneumonia) 01/21/2014  . Smoker 12/31/2013  . Acute respiratory failure with hypoxia (HCC) 12/31/2013  . Asthma exacerbation 12/30/2013  . Asthma exacerbation in COPD (HCC) 12/30/2013    Past Surgical History:  Procedure Laterality Date  . ABDOMINAL HYSTERECTOMY    . APPENDECTOMY    . BACK SURGERY       OB History   No obstetric history on file.      Home Medications    Prior to Admission medications   Medication Sig Start Date End Date Taking? Authorizing Provider  albuterol (PROVENTIL HFA;VENTOLIN HFA) 108 (90 BASE) MCG/ACT inhaler Inhale 1-2 puffs into the lungs every 6 (six) hours as needed for wheezing or shortness of breath. 01/02/14   Rama, Maryruth Bunhristina P, MD  albuterol (PROVENTIL) (2.5 MG/3ML) 0.083% nebulizer solution Take 3 mLs (2.5 mg total) by nebulization every 2 (two) hours as needed for wheezing or  shortness of breath. 01/25/14   Rhetta MuraSamtani, Jai-Gurmukh, MD  dextromethorphan (DELSYM) 30 MG/5ML liquid Take 30 mg by mouth as needed. 01/25/14   Rhetta MuraSamtani, Jai-Gurmukh, MD  Fluticasone-Salmeterol (ADVAIR) 250-50 MCG/DOSE AEPB Inhale 1 puff into the lungs every 12 (twelve) hours. 03/05/14   Nyoka CowdenWert, Michael B, MD  ipratropium (ATROVENT) 0.02 % nebulizer solution Take 2.5 mLs (0.5 mg total) by nebulization every 4 (four) hours as needed for wheezing or shortness of breath. 01/25/14   Rhetta MuraSamtani, Jai-Gurmukh, MD  nicotine (NICODERM CQ - DOSED IN MG/24 HR) 7 mg/24hr patch Place 1 patch (7 mg total) onto the skin daily. 01/02/14   Rama, Maryruth Bunhristina P, MD    Family History Family History  Problem Relation Age of Onset  . CAD Father   . Heart disease Father   . Colon cancer Mother   . Breast cancer Sister   . Emphysema Paternal Grandfather        never smoker, Theatre stage managerfire fighter    Social History Social History   Tobacco Use  . Smoking status: Former Smoker    Packs/day: 1.00    Years: 30.00    Pack years: 30.00    Types: Cigarettes    Last attempt to quit: 12/30/2013    Years since quitting: 4.7  . Smokeless tobacco: Never Used  Substance Use Topics  . Alcohol use: Yes    Comment: 3-4 times per wk  . Drug use: No  Allergies   Penicillins   Review of Systems Review of Systems  All other systems reviewed and are negative.    Physical Exam Updated Vital Signs BP (!) 161/98 (BP Location: Left Arm)   Pulse 87   Temp (!) 97.5 F (36.4 C) (Oral)   Resp 18   Ht 1.702 m (5\' 7" )   Wt 65.8 kg   SpO2 96%   BMI 22.71 kg/m   Physical Exam Vitals signs and nursing note reviewed.  Constitutional:      General: She is not in acute distress.    Appearance: Normal appearance. She is well-developed. She is not toxic-appearing.  HENT:     Head: Normocephalic and atraumatic.  Eyes:     General: Lids are normal.     Conjunctiva/sclera: Conjunctivae normal.     Pupils: Pupils are equal, round, and  reactive to light.  Neck:     Musculoskeletal: Normal range of motion and neck supple.     Thyroid: No thyroid mass.     Trachea: No tracheal deviation.  Cardiovascular:     Rate and Rhythm: Normal rate and regular rhythm.     Heart sounds: Normal heart sounds. No murmur. No gallop.   Pulmonary:     Effort: Pulmonary effort is normal. No respiratory distress.     Breath sounds: Normal breath sounds. No stridor. No decreased breath sounds, wheezing, rhonchi or rales.  Chest:     Chest wall: Tenderness present. No crepitus.    Abdominal:     General: Bowel sounds are normal. There is no distension.     Palpations: Abdomen is soft.     Tenderness: There is no abdominal tenderness. There is no rebound.  Musculoskeletal: Normal range of motion.        General: No tenderness.  Skin:    General: Skin is warm and dry.     Findings: No abrasion or rash.  Neurological:     Mental Status: She is alert and oriented to person, place, and time.     GCS: GCS eye subscore is 4. GCS verbal subscore is 5. GCS motor subscore is 6.     Cranial Nerves: No cranial nerve deficit.     Sensory: No sensory deficit.  Psychiatric:        Speech: Speech normal.        Behavior: Behavior normal.      ED Treatments / Results  Labs (all labs ordered are listed, but only abnormal results are displayed) Labs Reviewed  BASIC METABOLIC PANEL - Abnormal; Notable for the following components:      Result Value   Glucose, Bld 100 (*)    All other components within normal limits  CBC - Abnormal; Notable for the following components:   RBC 5.38 (*)    Hemoglobin 16.1 (*)    HCT 49.7 (*)    All other components within normal limits  I-STAT TROPONIN, ED  I-STAT BETA HCG BLOOD, ED ([facility], [facility], [facility] ONLY)  POCT I-STAT TROPONIN I    EKG EKG Interpretation  Date/Time:  Saturday October 11 2018 15:57:56 EST Ventricular Rate:  80 PR Interval:    QRS Duration: 100 QT Interval:  395 QTC Calculation: 456 R  Axis:   82 Text Interpretation:  Incomplete analysis due to missing data in precordial lead(s) Sinus rhythm Borderline right axis deviation Missing lead(s): V4 No significant change since last tracing except lead 4 missing Confirmed by Linwood Dibbles [phone]) on 10/11/2018 4:01:25 PM  Radiology Dg Chest 2 View  Result Date: 10/11/2018 CLINICAL DATA:  Right rib pain.  Shortness of breath EXAM: CHEST - 2 VIEW COMPARISON:  02/24/2014 FINDINGS: Normal heart size. No pleural effusion or edema identified. Chronic interstitial coarsening noted. No airspace opacities. Review of the visualized osseous structures is unremarkable. IMPRESSION: 1. No acute cardiopulmonary abnormalities. Electronically Signed   By: Signa Kell M.D.   On: 10/11/2018 17:34    Procedures Procedures (including critical care time)  Medications Ordered in ED Medications  ketorolac (TORADOL) injection 60 mg (has no administration in time range)  diazepam (VALIUM) tablet 10 mg (has no administration in time range)  oxyCODONE-acetaminophen (PERCOCET/ROXICET) 5-325 MG per tablet 1 tablet (has no administration in time range)     Initial Impression / Assessment and Plan / ED Course  I have reviewed the triage vital signs and the nursing notes.  Pertinent labs & imaging results that were available during my care of the patient were reviewed by me and considered in my medical decision making (see chart for details).     Chest x-ray is negative here.  Patient medicated for pain.  We treated for chest wall strain  Final Clinical Impressions(s) / ED Diagnoses   Final diagnoses:  None    ED Discharge Orders    None       Lorre Nick, MD 10/11/18 2123

## 2019-02-24 ENCOUNTER — Emergency Department (HOSPITAL_COMMUNITY)
Admission: EM | Admit: 2019-02-24 | Discharge: 2019-02-24 | Disposition: A | Discharge status: Home / Self Care | Payer: HRSA Program | Attending: Emergency Medicine | Admitting: Emergency Medicine

## 2019-02-24 ENCOUNTER — Encounter (HOSPITAL_COMMUNITY): Payer: Self-pay | Admitting: Emergency Medicine

## 2019-02-24 ENCOUNTER — Emergency Department (HOSPITAL_COMMUNITY): Payer: HRSA Program

## 2019-02-24 ENCOUNTER — Ambulatory Visit: Payer: Self-pay | Admitting: *Deleted

## 2019-02-24 ENCOUNTER — Other Ambulatory Visit: Payer: Self-pay

## 2019-02-24 DIAGNOSIS — Z87891 Personal history of nicotine dependence: Secondary | ICD-10-CM | POA: Diagnosis not present

## 2019-02-24 DIAGNOSIS — Z20828 Contact with and (suspected) exposure to other viral communicable diseases: Secondary | ICD-10-CM | POA: Diagnosis not present

## 2019-02-24 DIAGNOSIS — J441 Chronic obstructive pulmonary disease with (acute) exacerbation: Secondary | ICD-10-CM

## 2019-02-24 DIAGNOSIS — R0602 Shortness of breath: Secondary | ICD-10-CM | POA: Diagnosis present

## 2019-02-24 LAB — BASIC METABOLIC PANEL
Anion gap: 8 (ref 5–15)
BUN: 21 mg/dL — ABNORMAL HIGH (ref 6–20)
CO2: 24 mmol/L (ref 22–32)
Calcium: 8.8 mg/dL — ABNORMAL LOW (ref 8.9–10.3)
Chloride: 107 mmol/L (ref 98–111)
Creatinine, Ser: 0.73 mg/dL (ref 0.44–1.00)
GFR calc Af Amer: >60 mL/min (ref >60)
GFR calc non Af Amer: >60 mL/min (ref >60)
Glucose, Bld: 91 mg/dL (ref 70–99)
Potassium: 4 mmol/L (ref 3.5–5.1)
Sodium: 139 mmol/L (ref 135–145)

## 2019-02-24 LAB — CBC
HCT: 46.2 % — ABNORMAL HIGH (ref 36.0–46.0)
Hemoglobin: 14.9 g/dL (ref 12.0–15.0)
MCH: 29.8 pg (ref 26.0–34.0)
MCHC: 32.3 g/dL (ref 30.0–36.0)
MCV: 92.4 fL (ref 80.0–100.0)
Platelets: 300 10*3/uL (ref 150–400)
RBC: 5 MIL/uL (ref 3.87–5.11)
RDW: 13.6 % (ref 11.5–15.5)
WBC: 9 10*3/uL (ref 4.0–10.5)
nRBC: 0 % (ref 0.0–0.2)

## 2019-02-24 LAB — TROPONIN I: Troponin I: <0.03 ng/mL (ref <0.03)

## 2019-02-24 MED ORDER — ALBUTEROL SULFATE HFA 108 (90 BASE) MCG/ACT IN AERS: 2.0000 | INHALATION_SPRAY | RESPIRATORY_TRACT | 1 refills | Status: DC | PRN

## 2019-02-24 MED ORDER — PREDNISONE 20 MG PO TABS: 40.0000 mg | ORAL_TABLET | Freq: Every day | ORAL | 0 refills | Status: AC

## 2019-02-24 MED ORDER — AZITHROMYCIN 250 MG PO TABS: ORAL_TABLET | ORAL | 0 refills | Status: DC

## 2019-02-24 NOTE — Discharge Instructions (Addendum)
You were evaluated in the Emergency Department and after careful evaluation, we did not find any emergent condition requiring admission or further testing in the hospital.  Your symptoms today seem to be due to a flare of your COPD.  Please take the medications as directed and follow-up with your regular doctor.  You will be called with any abnormal results of your coronavirus test.  Please return to the Emergency Department if you experience any worsening of your condition.  We encourage you to follow up with a primary care provider.  Thank you for allowing [place] to be a part of your care.

## 2019-02-24 NOTE — ED Notes (Signed)
Bed: [facility] Expected date:  Expected time:  Means of arrival:  Comments: 

## 2019-02-24 NOTE — ED Triage Notes (Signed)
Pt been having cough, SOB, fatigue for 3-4 days. 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 Was advised on phone to go to ED to R/o PNA.

## 2019-02-24 NOTE — Telephone Encounter (Signed)
Pt calling requesting a test for covid-19. She states she has a slight headache, shortness of breath, fatigue, runny nose and sore throat. She feels more short of breath than usual. She had a fever of 99.4 last week. She has a hx of asthma.  She has not been in contact with anyone that has tested positive.  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  Advised to go to the ED to be assessed for covid-19 and possible pneumonia. Pt voiced understanding and will be going to [facility].  Reason for Disposition . MODERATE difficulty breathing (e.g., speaks in phrases, SOB even at rest, pulse 100-120)  Answer Assessment - Initial Assessment Questions 1. COVID-19 DIAGNOSIS: "Who made your Coronavirus (COVID-19) diagnosis?" "Was it confirmed by a positive lab test?" If not diagnosed by a HCP, ask "Are there lots of cases (community spread) where you live?" (See public health department website, if unsure)   * MAJOR community spread: high number of cases; numbers of cases are increasing; many people hospitalized.   * MINOR community spread: low number of cases; not increasing; few or no people hospitalized     In the community 2. ONSET: "When did the COVID-19 symptoms start?"      Last Wednesday 3. WORST SYMPTOM: "What is your worst symptom?" (e.g., cough, fever, shortness of breath, muscle aches)     Cough, shortness of breath, headache, sore throat, fatigue, had stomach virus 4. COUGH: "Do you have a cough?" If so, ask: "How bad is the cough?"       Not severe, comes and goes especially with exertion 5. FEVER: "Do you have a fever?" If so, ask: "What is your temperature, how was it measured, and when did it start?"     Last week 99.4 6. RESPIRATORY STATUS: "Describe your breathing?" (e.g., shortness of breath, wheezing, unable to speak)      Shortness of breath 7. BETTER-SAME-WORSE: "Are you getting better, staying the same or getting worse compared to yesterday?"  If getting  worse, ask, "In what way?"     The same about 4 days 8. HIGH RISK DISEASE: "Do you have any chronic medical problems?" (e.g., asthma, heart or lung disease, weak immune system, etc.)     asthma 9. PREGNANCY: "Is there any chance you are pregnant?" "When was your last menstrual period?"     n/a 10. OTHER SYMPTOMS: "Do you have any other symptoms?"  (e.g., runny nose, headache, sore throat, loss of smell)       Headache, sore throat, runny nose, loss of taste  Protocols used: CORONAVIRUS (COVID-19) DIAGNOSED OR SUSPECTED-A-AH

## 2019-02-24 NOTE — ED Notes (Signed)
ED Provider at bedside. 

## 2019-02-24 NOTE — ED Provider Notes (Signed)
[facility] Emergency Department Provider Note MRN:  [id number]  Arrival date & time: 02/24/19     Chief Complaint   Shortness of Breath; Fatigue; and Cough   History of Present Illness   Angel [name] is a 63 y.o. year-old female with a history of COPD, tobacco abuse, bronchiectasis presenting to the ED with chief complaint of shortness of breath.  2 days of increased cough, fatigue, shortness of breath.  Also endorsing right-sided sharp chest pain, worse with deep breaths.  Endorsing loss of taste, feels like she is come down with a viral illness.  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  Symptoms are gradual onset, constant, no other exacerbating relieving factors.  Denies nausea vomiting, no dizziness or diaphoresis, no abdominal pain, no leg pain or swelling.  Review of Systems  A complete 10 system review of systems was obtained and all systems are negative except as noted in the HPI and PMH.   Patient's Health History    Past Medical History:  Diagnosis Date  . Asthma   . Bronchiolitis     Past Surgical History:  Procedure Laterality Date  . ABDOMINAL HYSTERECTOMY    . APPENDECTOMY    . BACK SURGERY      Family History  Problem Relation Age of Onset  . CAD Father   . Heart disease Father   . Colon cancer Mother   . Breast cancer Sister   . Emphysema Paternal Grandfather        never smoker, Theatre stage managerfire fighter    Social History   Socioeconomic History  . Marital status: Divorced    Spouse name: Not on file  . Number of children: Not on file  . Years of education: Not on file  . Highest education level: Not on file  Occupational History  . Occupation: Unemployed  Social Needs  . Financial resource strain: Not on file  . Food insecurity:    Worry: Not on file    Inability: Not on file  . Transportation needs:    Medical: Not on file    Non-medical: Not on file  Tobacco Use  . Smoking status: Former Smoker   Packs/day: 1.00    Years: 30.00    Pack years: 30.00    Types: Cigarettes    Last attempt to quit: 12/30/2013    Years since quitting: 5.1  . Smokeless tobacco: Never Used  Substance and Sexual Activity  . Alcohol use: Yes    Comment: 3-4 times per wk  . Drug use: No  . Sexual activity: Not Currently  Lifestyle  . Physical activity:    Days per week: Not on file    Minutes per session: Not on file  . Stress: Not on file  Relationships  . Social connections:    Talks on phone: Not on file    Gets together: Not on file    Attends religious service: Not on file    Active member of club or organization: Not on file    Attends meetings of clubs or organizations: Not on file    Relationship status: Not on file  . Intimate partner violence:    Fear of current or ex partner: Not on file    Emotionally abused: Not on file    Physically abused: Not on file    Forced sexual activity: Not on file  Other Topics Concern  . 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    Never pregnant, NO husband   Social drinkier    Quite smoking 3/15     Physical Exam  Vital Signs and Nursing Notes reviewed Vitals:   02/24/19 1542 02/24/19 1826  BP: (!) 148/85   Pulse: 90 85  Resp: 17 18  Temp: 97.9 F (36.6 C)   SpO2: 97% 97%    CONSTITUTIONAL: Chronically ill-appearing, NAD NEURO:  Alert and oriented x 3, no focal deficits EYES:  eyes equal and reactive ENT/NECK:  no LAD, no JVD CARDIO: Regular rate, well-perfused, normal S1 and S2 PULM: Scattered wheezes, normal work of breathing GI/GU:  normal bowel sounds, non-distended, non-tender MSK/SPINE:  No gross deformities, no edema SKIN:  no rash, atraumatic PSYCH:  Appropriate speech and behavior  Diagnostic and Interventional Summary    EKG Interpretation  Date/Time:  Tuesday Feb 24 2019 15:37:48 EDT Ventricular Rate:  89 PR  Interval:    QRS Duration: 108 QT Interval:  372 QTC Calculation: 453 R Axis:   83 Text Interpretation:  Sinus rhythm Borderline right axis deviation Confirmed by Kennis Carina [phone]) on 02/24/2019 3:59:14 PM      Labs Reviewed  CBC - Abnormal; Notable for the following components:      Result Value   HCT 46.2 (*)    All other components within normal limits  BASIC METABOLIC PANEL - Abnormal; Notable for the following components:   BUN 21 (*)    Calcium 8.8 (*)    All other components within normal limits  NOVEL CORONAVIRUS, NAA (HOSPITAL ORDER, SEND-OUT TO REF LAB)  TROPONIN I    DG Chest Port 1 View  Final Result      Medications - No data to display   Procedures Critical Care  ED Course and Medical Decision Making  I have reviewed the triage vital signs and the nursing notes.  Pertinent labs & imaging results that were available during my care of the patient were reviewed by me and considered in my medical decision making (see below for details).  Favoring viral illness triggering COPD exacerbation, less likely pneumonia, patient is with no evidence of DVT, normal oxygen saturations, little to no concern for pulmonary embolism.  Chest pain is atypical, very low concern for ACS, will screen with EKG and troponin.  Anticipating discharge with either antibiotics or steroids.  Troponin negative, chest x-ray unremarkable, coronavirus test was sent as a send out.  Patient is okay with going home and checking on my chart within the next few days.  Will provide management of mild COPD exacerbation.  After the discussed management above, the patient was determined to be safe for discharge.  The patient was in agreement with this plan and all questions regarding their care were answered.  ED return precautions were discussed and the patient will return to the ED with any significant worsening of condition.  [name]. Pilar Plate, MD [facility] Emergency Medicine [facility]  Health [email or web address]  Final Clinical Impressions(s) / ED Diagnoses     ICD-10-CM   1. COPD exacerbation (HCC) J44.1     ED Discharge Orders         Ordered    predniSONE (DELTASONE) 20 MG tablet  Daily     02/24/19 1835    azithromycin (ZITHROMAX) 250 MG tablet     02/24/19 1835    albuterol (VENTOLIN HFA) 108 (90 Base)  MCG/ACT inhaler  Every 4 hours PRN     02/24/19 1835             Sabas Sous, MD 02/24/19 1836

## 2019-02-25 LAB — NOVEL CORONAVIRUS, NAA (HOSP ORDER, SEND-OUT TO REF LAB; TAT 18-24 HRS): SARS-CoV-2, NAA: NOT DETECTED

## 2019-04-15 ENCOUNTER — Other Ambulatory Visit: Payer: Self-pay

## 2019-04-15 ENCOUNTER — Inpatient Hospital Stay (HOSPITAL_COMMUNITY)
Admission: EM | Admit: 2019-04-15 | Discharge: 2019-04-17 | DRG: 190 | Disposition: A | Discharge status: Home / Self Care | Payer: Self-pay | Attending: Family Medicine | Admitting: Family Medicine

## 2019-04-15 ENCOUNTER — Emergency Department (HOSPITAL_COMMUNITY): Payer: Self-pay

## 2019-04-15 ENCOUNTER — Encounter (HOSPITAL_COMMUNITY): Payer: Self-pay | Admitting: *Deleted

## 2019-04-15 DIAGNOSIS — Z90710 Acquired absence of both cervix and uterus: Secondary | ICD-10-CM

## 2019-04-15 DIAGNOSIS — R0902 Hypoxemia: Secondary | ICD-10-CM

## 2019-04-15 DIAGNOSIS — J441 Chronic obstructive pulmonary disease with (acute) exacerbation: Principal | ICD-10-CM | POA: Diagnosis present

## 2019-04-15 DIAGNOSIS — Z1159 Encounter for screening for other viral diseases: Secondary | ICD-10-CM

## 2019-04-15 DIAGNOSIS — I10 Essential (primary) hypertension: Secondary | ICD-10-CM | POA: Diagnosis present

## 2019-04-15 DIAGNOSIS — R079 Chest pain, unspecified: Secondary | ICD-10-CM | POA: Diagnosis present

## 2019-04-15 DIAGNOSIS — J9601 Acute respiratory failure with hypoxia: Secondary | ICD-10-CM | POA: Diagnosis present

## 2019-04-15 DIAGNOSIS — Z87891 Personal history of nicotine dependence: Secondary | ICD-10-CM

## 2019-04-15 LAB — CBC WITH DIFFERENTIAL/PLATELET
Abs Immature Granulocytes: 0.03 10*3/uL (ref 0.00–0.07)
Abs Immature Granulocytes: 0.03 10*3/uL (ref 0.00–0.07)
Basophils Absolute: 0 10*3/uL (ref 0.0–0.1)
Basophils Absolute: 0.1 10*3/uL (ref 0.0–0.1)
Basophils Relative: 1 %
Basophils Relative: 1 %
Eosinophils Absolute: 0 10*3/uL (ref 0.0–0.5)
Eosinophils Absolute: 1.1 10*3/uL — ABNORMAL HIGH (ref 0.0–0.5)
Eosinophils Relative: 0 %
Eosinophils Relative: 10 %
HCT: 46.1 % — ABNORMAL HIGH (ref 36.0–46.0)
HCT: 46.9 % — ABNORMAL HIGH (ref 36.0–46.0)
Hemoglobin: 15.1 g/dL — ABNORMAL HIGH (ref 12.0–15.0)
Hemoglobin: 15.5 g/dL — ABNORMAL HIGH (ref 12.0–15.0)
Immature Granulocytes: 0 %
Immature Granulocytes: 0 %
Lymphocytes Relative: 36 %
Lymphocytes Relative: 8 %
Lymphs Abs: 0.6 10*3/uL — ABNORMAL LOW (ref 0.7–4.0)
Lymphs Abs: 3.6 10*3/uL (ref 0.7–4.0)
MCH: 30.7 pg (ref 26.0–34.0)
MCH: 31.3 pg (ref 26.0–34.0)
MCHC: 32.8 g/dL (ref 30.0–36.0)
MCHC: 33 g/dL (ref 30.0–36.0)
MCV: 93.7 fL (ref 80.0–100.0)
MCV: 94.7 fL (ref 80.0–100.0)
Monocytes Absolute: 0.1 10*3/uL (ref 0.1–1.0)
Monocytes Absolute: 0.9 10*3/uL (ref 0.1–1.0)
Monocytes Relative: 1 %
Monocytes Relative: 9 %
Neutro Abs: 4.4 10*3/uL (ref 1.7–7.7)
Neutro Abs: 7.8 10*3/uL — ABNORMAL HIGH (ref 1.7–7.7)
Neutrophils Relative %: 44 %
Neutrophils Relative %: 90 %
Platelets: 288 10*3/uL (ref 150–400)
Platelets: 304 10*3/uL (ref 150–400)
RBC: 4.92 MIL/uL (ref 3.87–5.11)
RBC: 4.95 MIL/uL (ref 3.87–5.11)
RDW: 13.5 % (ref 11.5–15.5)
RDW: 13.7 % (ref 11.5–15.5)
WBC: 10.1 10*3/uL (ref 4.0–10.5)
WBC: 8.5 10*3/uL (ref 4.0–10.5)
nRBC: 0 % (ref 0.0–0.2)
nRBC: 0 % (ref 0.0–0.2)

## 2019-04-15 LAB — BASIC METABOLIC PANEL
Anion gap: 11 (ref 5–15)
BUN: 14 mg/dL (ref 6–20)
CO2: 24 mmol/L (ref 22–32)
Calcium: 9.1 mg/dL (ref 8.9–10.3)
Chloride: 106 mmol/L (ref 98–111)
Creatinine, Ser: 0.6 mg/dL (ref 0.44–1.00)
GFR calc Af Amer: >60 mL/min (ref >60)
GFR calc non Af Amer: >60 mL/min (ref >60)
Glucose, Bld: 90 mg/dL (ref 70–99)
Potassium: 3.8 mmol/L (ref 3.5–5.1)
Sodium: 141 mmol/L (ref 135–145)

## 2019-04-15 LAB — BLOOD GAS, VENOUS
Acid-Base Excess: 0.1 mmol/L (ref 0.0–2.0)
Bicarbonate: 23.9 mmol/L (ref 20.0–28.0)
O2 Content: 2 L/min
O2 Saturation: 91.6 %
Patient temperature: 98.6
pCO2, Ven: 38.3 mmHg — ABNORMAL LOW (ref 44.0–60.0)
pH, Ven: 7.412 (ref 7.250–7.430)
pO2, Ven: 62.4 mmHg — ABNORMAL HIGH (ref 32.0–45.0)

## 2019-04-15 LAB — SARS CORONAVIRUS 2 BY RT PCR (HOSPITAL ORDER, PERFORMED IN ~~LOC~~ HOSPITAL LAB): SARS Coronavirus 2: NEGATIVE

## 2019-04-15 LAB — TROPONIN I (HIGH SENSITIVITY): Troponin I (High Sensitivity): 2 ng/L (ref <18)

## 2019-04-15 LAB — D-DIMER, QUANTITATIVE: D-Dimer, Quant: <0.27 ug/mL-FEU (ref 0.00–0.50)

## 2019-04-15 MED ORDER — IPRATROPIUM-ALBUTEROL 0.5-2.5 (3) MG/3ML IN SOLN
3.0000 mL | Freq: Once | RESPIRATORY_TRACT | Status: AC
  Administered 2019-04-15: 3 mL via RESPIRATORY_TRACT
  Filled 2019-04-15: qty 3

## 2019-04-15 MED ORDER — SODIUM CHLORIDE 0.9 % IV SOLN
500.0000 mg | INTRAVENOUS | Status: AC
  Administered 2019-04-15: 500 mg via INTRAVENOUS
  Filled 2019-04-15: qty 500

## 2019-04-15 MED ORDER — ALBUTEROL SULFATE (2.5 MG/3ML) 0.083% IN NEBU: 2.5000 mg | INHALATION_SOLUTION | RESPIRATORY_TRACT | Status: DC | PRN

## 2019-04-15 MED ORDER — SODIUM CHLORIDE 0.9% FLUSH
3.0000 mL | Freq: Two times a day (BID) | INTRAVENOUS | Status: DC
  Administered 2019-04-15 – 2019-04-17 (×4): 3 mL via INTRAVENOUS

## 2019-04-15 MED ORDER — ZOLPIDEM TARTRATE 5 MG PO TABS
5.0000 mg | ORAL_TABLET | Freq: Once | ORAL | Status: AC
  Administered 2019-04-15: 5 mg via ORAL
  Filled 2019-04-15: qty 1

## 2019-04-15 MED ORDER — IPRATROPIUM-ALBUTEROL 0.5-2.5 (3) MG/3ML IN SOLN
3.0000 mL | Freq: Four times a day (QID) | RESPIRATORY_TRACT | Status: DC
  Administered 2019-04-16 – 2019-04-17 (×6): 3 mL via RESPIRATORY_TRACT
  Filled 2019-04-15 (×6): qty 3

## 2019-04-15 MED ORDER — ACETAMINOPHEN 325 MG PO TABS: 650.0000 mg | ORAL_TABLET | Freq: Four times a day (QID) | ORAL | Status: DC | PRN

## 2019-04-15 MED ORDER — MOMETASONE FURO-FORMOTEROL FUM 200-5 MCG/ACT IN AERO
1.0000 | INHALATION_SPRAY | Freq: Two times a day (BID) | RESPIRATORY_TRACT | Status: DC
  Administered 2019-04-16 – 2019-04-17 (×4): 1 via RESPIRATORY_TRACT
  Filled 2019-04-15: qty 8.8

## 2019-04-15 MED ORDER — MAGNESIUM SULFATE 2 GM/50ML IV SOLN
2.0000 g | Freq: Once | INTRAVENOUS | Status: AC
  Administered 2019-04-15: 2 g via INTRAVENOUS
  Filled 2019-04-15: qty 50

## 2019-04-15 MED ORDER — GUAIFENESIN ER 600 MG PO TB12
600.0000 mg | ORAL_TABLET | Freq: Two times a day (BID) | ORAL | Status: DC
  Administered 2019-04-15 – 2019-04-17 (×4): 600 mg via ORAL
  Filled 2019-04-15 (×4): qty 1

## 2019-04-15 MED ORDER — ONDANSETRON HCL 4 MG PO TABS: 4.0000 mg | ORAL_TABLET | Freq: Four times a day (QID) | ORAL | Status: DC | PRN

## 2019-04-15 MED ORDER — METHYLPREDNISOLONE SODIUM SUCC 125 MG IJ SOLR
60.0000 mg | Freq: Two times a day (BID) | INTRAMUSCULAR | Status: AC
  Administered 2019-04-16 (×2): 60 mg via INTRAVENOUS
  Filled 2019-04-15 (×2): qty 2

## 2019-04-15 MED ORDER — SODIUM CHLORIDE 0.9 % IV SOLN
INTRAVENOUS | Status: DC
  Administered 2019-04-15: 23:00:00 via INTRAVENOUS

## 2019-04-15 MED ORDER — AZITHROMYCIN 250 MG PO TABS
500.0000 mg | ORAL_TABLET | Freq: Every day | ORAL | Status: DC
  Administered 2019-04-16 – 2019-04-17 (×2): 500 mg via ORAL
  Filled 2019-04-15 (×3): qty 2

## 2019-04-15 MED ORDER — ACETAMINOPHEN 650 MG RE SUPP: 650.0000 mg | Freq: Four times a day (QID) | RECTAL | Status: DC | PRN

## 2019-04-15 MED ORDER — PREDNISONE 20 MG PO TABS
40.0000 mg | ORAL_TABLET | Freq: Every day | ORAL | Status: DC
  Administered 2019-04-17: 40 mg via ORAL
  Filled 2019-04-15: qty 2

## 2019-04-15 MED ORDER — HYDROCODONE-ACETAMINOPHEN 5-325 MG PO TABS: 1.0000 | ORAL_TABLET | ORAL | Status: DC | PRN

## 2019-04-15 MED ORDER — METHYLPREDNISOLONE SODIUM SUCC 125 MG IJ SOLR
125.0000 mg | Freq: Once | INTRAMUSCULAR | Status: AC
  Administered 2019-04-15: 125 mg via INTRAVENOUS
  Filled 2019-04-15: qty 2

## 2019-04-15 MED ORDER — ENOXAPARIN SODIUM 40 MG/0.4ML ~~LOC~~ SOLN
40.0000 mg | Freq: Every day | SUBCUTANEOUS | Status: DC
  Administered 2019-04-15 – 2019-04-16 (×2): 40 mg via SUBCUTANEOUS
  Filled 2019-04-15 (×2): qty 0.4

## 2019-04-15 MED ORDER — ALBUTEROL SULFATE HFA 108 (90 BASE) MCG/ACT IN AERS
4.0000 | INHALATION_SPRAY | Freq: Once | RESPIRATORY_TRACT | Status: AC
  Administered 2019-04-15: 4 via RESPIRATORY_TRACT
  Filled 2019-04-15: qty 6.7

## 2019-04-15 MED ORDER — ONDANSETRON HCL 4 MG/2ML IJ SOLN: 4.0000 mg | Freq: Four times a day (QID) | INTRAMUSCULAR | Status: DC | PRN

## 2019-04-15 NOTE — ED Notes (Signed)
Family at bedside. 

## 2019-04-15 NOTE — ED Notes (Signed)
Walked pt with pulse oximetry, Pt starting O2 qas 89% dropped to 83% with a heart rate of 88

## 2019-04-15 NOTE — ED Triage Notes (Signed)
Pt complains of shortness of breath and right sided chest pain worse with ins[iration x 2 weeks. Pt states her pulse oximeter was reading 89-91% at home, which concerned her. Pt is also concerned she has COVID 19. Pt was seen recently and told she had COPD exacerbation, given steroids and inhaler. Pt states it helped but symptoms came back.

## 2019-04-15 NOTE — H&P (Signed)
Angel [name]:[id number] DOB: [birth year] DOA: 04/15/2019     PCP: Patient, No Pcp Per   Outpatient Specialists:     Pulmonary   Dr. Sherene Sires    Patient arrived to ER on 04/15/19 at 1516  Patient coming from: home Lives alone,      Chief Complaint:  Chief Complaint  Patient presents with  . Shortness of Breath    HPI: Angel [name] is a 63 y.o. female with medical history significant of COPD     Presented with 1 week of worsening cough shortness of breath, dyspnea with exertion   He was diagnosed with COPD exacerbation given steroids and inhaler but she continues to be short of breath today was hypoxic at home down to 89% room air which is concerning she was worried she has COVID-19 She also endorsing chest pain worse with breathing and coughing over right side of her chest No hemoptysis, no leg swelling no travel Stopped smoking few months ago She finished her steroids few months ago but never got better She has been wheezing  She does not smoke but her husband does  Infectious risk factors:  Reports shortness of breath, dry cough, chest pain,  sick contacts, known COVID 19 exposure, Travel to high risk area, Exposure to travelers from high risk area   In  ER RAPID COVID TEST NEGATIVE      Regarding pertinent Chronic problems:        COPD - followed by pulmonology    While in ER: Started on 2 L and was satting up to 96% Patient desaturated with walking down to 83% The following Work up has been ordered so far:  Orders Placed This Encounter  Procedures  . SARS Coronavirus 2 (CEPHEID - Performed in [facility] hospital lab), [facility]  . Culture, sputum-assessment  . DG Chest 2 View  . Basic metabolic panel  . CBC with Differential/Platelet  . Magnesium  . CBC WITH DIFFERENTIAL  . HIV antibody  . Urinalysis, Routine w reflex microscopic  . D-dimer, quantitative (not at [facility])  . Troponin I (High Sensitivity)  . Blood gas, venous  . Magnesium  .  Phosphorus  . TSH  . Comprehensive metabolic panel  . CBC  . Diet Heart Room service appropriate? Yes; Fluid consistency: Thin  . Cardiac monitoring  . Check Peak Flow  . Initiate Carrier Fluid Protocol  . Check Pulse Oximetry while ambulating  . If O2 sat If O2 sat <94% administer O2 @@ 2 liters/minute via nasal cannula.  . Intake and Output  . Refer to Sidebar Report: COPD Guidelines and Care Plan Goals  . Elevate head of bed  . If diabetic or glucose greater than 140 notify MD to place Glycemic Control Order Set  . Maintain IV access  . Cardiac Monitoring Continuous x 12 hours Indications for use: Other; other indications for use: chest pain  . Maintain IV access  . Turn cough deep breathe  . Assess  . Call MD For O2 Sat Less Than  . Check Pulse Oximetry while ambulating  . RN may order General Admission PRN Orders utilizing "General Admission PRN medications" (through manage orders) for the following patient needs: allergy symptoms (Claritin), cold sores (Carmex), cough (Robitussin DM), eye irritation (Liquifilm Tears), hemorrhoids (Tucks), indigestion (Maalox), minor skin irritation (Hydrocortisone Cream), muscle pain Romeo Apple Gay), nose irritation (saline nasal spray) and sore throat (Chloraseptic spray).  . Cardiac monitoring  . Vital signs  . Notify physician  .  Up with assistance  . If patient diabetic or glucose greater than 140 notify physician for Sliding Scale Insulin Orders  . May go off telemetry for tests/procedures  . Oral care per nursing protocol  . Initiate Oral Care Protocol  . Initiate Carrier Fluid Protocol  . RN may order General Admission PRN Orders utilizing "General Admission PRN medications" (through manage orders) for the following patient needs: allergy symptoms (Claritin), cold sores (Carmex), cough (Robitussin DM), eye irritation (Liquifilm Tears), hemorrhoids (Tucks), indigestion (Maalox), minor skin irritation (Hydrocortisone Cream), muscle pain Romeo Apple Gay),  nose irritation (saline nasal spray) and sore throat (Chloraseptic spray).  . Patient has an active order for admit to inpatient/place in observation  . Cardiac Monitoring Continuous x 24 hours Indications for use: Other; other indications for use: chest pain  . Full code  . Consult to hospitalist  ALL PATIENTS BEING ADMITTED/HAVING PROCEDURES NEED COVID-19 SCREENING  . Consult to care management  . Consult to respiratory care treatment  . OT eval and treat  . PT eval and treat  . Pulse oximetry, continuous  . Adult wheeze protocol - initiate by RT - includes Albuterol/Atrovent Treatment  . Oxygen therapy Mode or (Route): Nasal cannula; Liters Per Minute: 2; Keep 02 saturation: greater than 92 %  . Incentive spirometry RT  . Flutter valve  . Pulse oximetry check with vital signs  . Oxygen therapy Mode or (Route): Nasal cannula; Liters Per Minute: 2; Keep 02 saturation: greater than 92 %  . Incentive spirometry  . EKG 12-Lead  . ED EKG  . 12 lead EKG  . EKG 12-Lead  . EKG 12-Lead  . Insert peripheral IV  . Insert peripheral IV  . Admit to Inpatient (patient's expected length of stay will be greater than 2 midnights or inpatient only procedure)      Following Medications were ordered in ER: Medications  sodium chloride flush (NS) 0.9 % injection 3 mL (has no administration in time range)  mometasone-formoterol (DULERA) 200-5 MCG/ACT inhaler 1 puff (has no administration in time range)  azithromycin (ZITHROMAX) 500 mg in sodium chloride 0.9 % 250 mL IVPB (has no administration in time range)    Followed by  azithromycin (ZITHROMAX) tablet 500 mg (has no administration in time range)  methylPREDNISolone sodium succinate (SOLU-MEDROL) 125 mg/2 mL injection 60 mg (has no administration in time range)    Followed by  predniSONE (DELTASONE) tablet 40 mg (has no administration in time range)  ipratropium-albuterol (DUONEB) 0.5-2.5 (3) MG/3ML nebulizer solution 3 mL (has no administration  in time range)  albuterol (PROVENTIL) (2.5 MG/3ML) 0.083% nebulizer solution 2.5 mg (has no administration in time range)  acetaminophen (TYLENOL) tablet 650 mg (has no administration in time range)    Or  acetaminophen (TYLENOL) suppository 650 mg (has no administration in time range)  HYDROcodone-acetaminophen (NORCO/VICODIN) 5-325 MG per tablet 1-2 tablet (has no administration in time range)  ondansetron (ZOFRAN) tablet 4 mg (has no administration in time range)    Or  ondansetron (ZOFRAN) injection 4 mg (has no administration in time range)  enoxaparin (LOVENOX) injection 40 mg (has no administration in time range)  0.9 %  sodium chloride infusion (has no administration in time range)  guaiFENesin (MUCINEX) 12 hr tablet 600 mg (has no administration in time range)  magnesium sulfate IVPB 2 g 50 mL (0 g Intravenous Stopped 04/15/19 2110)  methylPREDNISolone sodium succinate (SOLU-MEDROL) 125 mg/2 mL injection 125 mg (125 mg Intravenous Given 04/15/19 1805)  albuterol (VENTOLIN HFA)  108 (90 Base) MCG/ACT inhaler 4 puff (4 puffs Inhalation Given 04/15/19 1811)  ipratropium-albuterol (DUONEB) 0.5-2.5 (3) MG/3ML nebulizer solution 3 mL (3 mLs Nebulization Given 04/15/19 2159)        Consult Orders  (From admission, onward)         Start     Ordered   04/15/19 2300  Consult to care management  Once    Comments: COPD  Provider:  (Not yet assigned)  Question:  Reason for consult:  Answer:  Other (see comments)   04/15/19 2259   04/15/19 2300  PT eval and treat  Routine    Question:  Reason for PT?  Answer:  COPD   04/15/19 2259   04/15/19 2300  OT eval and treat  Routine    Question:  Reason for OT?  Answer:  COPD   04/15/19 2259   04/15/19 2300  Consult to respiratory care treatment  Once    Provider:  (Not yet assigned)  Question:  Reason for Consult?  Answer:  Educate patient regarding use of nebulizers and MDI   04/15/19 2259   04/15/19 2044  Consult to hospitalist  ALL PATIENTS  BEING ADMITTED/HAVING PROCEDURES NEED COVID-19 SCREENING  Once    Comments: ALL PATIENTS BEING ADMITTED/HAVING PROCEDURES NEED COVID-19 SCREENING  Provider:  (Not yet assigned)  Question Answer Comment  Place call to: [facility]   Reason for Consult Admit      04/15/19 2043          Significant initial  Findings: Abnormal Labs Reviewed  CBC WITH DIFFERENTIAL/PLATELET - Abnormal; Notable for the following components:      Result Value   Hemoglobin 15.1 (*)    HCT 46.1 (*)    Eosinophils Absolute 1.1 (*)    All other components within normal limits  BLOOD GAS, VENOUS - Abnormal; Notable for the following components:   pCO2, Ven 38.3 (*)    pO2, Ven 62.4 (*)    All other components within normal limits    Otherwise labs showing:    Recent Labs  Lab 04/15/19 1730  NA 141  K 3.8  CO2 24  GLUCOSE 90  BUN 14  CREATININE 0.60  CALCIUM 9.1    Cr   stable,   Lab Results  Component Value Date   CREATININE 0.60 04/15/2019   CREATININE 0.73 02/24/2019   CREATININE 0.84 10/11/2018    No results for input(s): AST, ALT, ALKPHOS, BILITOT, PROT, ALBUMIN in the last 168 hours. Lab Results  Component Value Date   CALCIUM 9.1 04/15/2019      WBC       Component Value Date/Time   WBC 10.1 04/15/2019 1730   ANC    Component Value Date/Time   NEUTROABS 4.4 04/15/2019 1730   Plt: Lab Results  Component Value Date   PLT 288 04/15/2019      COVID-19 Labs     Lab Results  Component Value Date   SARSCOV2NAA NEGATIVE 04/15/2019   SARSCOV2NAA NOT DETECTED 02/24/2019       Venous  Blood Gas result:  pH  7.412 pCO2 38.3       HG/HCT        stable,       Component Value Date/Time   HGB 15.1 (H) 04/15/2019 1730   HCT 46.1 (H) 04/15/2019 1730      UA not ordered      CXR -  NON acute    ECG:  Personally reviewed by me showing:  HR : 88 Rhythm:  NSR    no evidence of ischemic changes QTC 451      ED Triage Vitals  Enc Vitals Group     BP  04/15/19 1535 (!) 145/85     Pulse Rate 04/15/19 1535 91     Resp 04/15/19 1535 (!) 22     Temp 04/15/19 1535 98 F (36.7 C)     Temp Source 04/15/19 1535 Oral     SpO2 04/15/19 1535 91 %     Weight --      Height --      Head Circumference --      Peak Flow --      Pain Score 04/15/19 1544 0     Pain Loc --      Pain Edu? --      Excl. in GC? --   TMAX(24)@       Latest  Blood pressure (!) 145/85, pulse 91, temperature 98 F (36.7 C), temperature source Oral, resp. rate (!) 22, SpO2 91 %.     Hospitalist was called for admission for COPD exacerbation   Review of Systems:    Pertinent positives include: fatigue, shortness of breath at rest.   dyspnea on exertion,   excess mucus,  productive cough,  Constitutional:  No weight loss, night sweats, Fevers, chills,  weight loss  HEENT:  No headaches, Difficulty swallowing,Tooth/dental problems,Sore throat,  No sneezing, itching, ear ache, nasal congestion, post nasal drip,  Cardio-vascular:  No chest pain, Orthopnea, PND, anasarca, dizziness, palpitations.no Bilateral lower extremity swelling  GI:  No heartburn, indigestion, abdominal pain, nausea, vomiting, diarrhea, change in bowel habits, loss of appetite, melena, blood in stool, hematemesis Resp:  no  No non-productive cough, No coughing up of blood.No change in color of mucus.No wheezing. Skin:  no rash or lesions. No jaundice GU:  no dysuria, change in color of urine, no urgency or frequency. No straining to urinate.  No flank pain.  Musculoskeletal:  No joint pain or no joint swelling. No decreased range of motion. No back pain.  Psych:  No change in mood or affect. No depression or anxiety. No memory loss.  Neuro: no localizing neurological complaints, no tingling, no weakness, no double vision, no gait abnormality, no slurred speech, no confusion  All systems reviewed and apart from HOPI all are negative  Past Medical History:   Past Medical History:   Diagnosis Date  . Asthma   . Bronchiolitis       Past Surgical History:  Procedure Laterality Date  . ABDOMINAL HYSTERECTOMY    . APPENDECTOMY    . BACK SURGERY      Social History:  Ambulatory  Independently      reports that she quit smoking about 5 years ago. Her smoking use included cigarettes. She has a 30.00 pack-year smoking history. She has never used smokeless tobacco. She reports current alcohol use. She reports that she does not use drugs.     Family History:   Family History  Problem Relation Age of Onset  . CAD Father   . Heart disease Father   . Colon cancer Mother   . Breast cancer Sister   . Emphysema Paternal Grandfather        never smoker, Theatre stage manager    Allergies: Allergies  Allergen Reactions  . Penicillins Hives     Prior to Admission medications   Medication Sig Start Date End Date Taking? Authorizing Provider  albuterol (VENTOLIN HFA) 108 (  90 Base) MCG/ACT inhaler Inhale 2 puffs into the lungs every 4 (four) hours as needed for wheezing or shortness of breath. 02/24/19  Yes Sabas SousBero, Michael M, MD  albuterol (PROVENTIL HFA;VENTOLIN HFA) 108 (90 BASE) MCG/ACT inhaler Inhale 1-2 puffs into the lungs every 6 (six) hours as needed for wheezing or shortness of breath. Patient not taking: Reported on 04/15/2019 01/02/14   Rama, Maryruth Bunhristina P, MD  albuterol (PROVENTIL) (2.5 MG/3ML) 0.083% nebulizer solution Take 3 mLs (2.5 mg total) by nebulization every 2 (two) hours as needed for wheezing or shortness of breath. Patient not taking: Reported on 04/15/2019 01/25/14   Rhetta MuraSamtani, Jai-Gurmukh, MD  azithromycin (ZITHROMAX) 250 MG tablet Take 2 tablets together on the first day, then 1 every day until finished. Patient not taking: Reported on 04/15/2019 02/24/19   Sabas SousBero, Michael M, MD  diazepam (VALIUM) 2 MG tablet Take 1 tablet (2 mg total) by mouth every 6 (six) hours as needed for muscle spasms. Patient not taking: Reported on 04/15/2019 10/11/18   Lorre NickAllen, Anthony, MD   Fluticasone-Salmeterol (ADVAIR) 250-50 MCG/DOSE AEPB Inhale 1 puff into the lungs every 12 (twelve) hours. Patient not taking: Reported on 04/15/2019 03/05/14   Nyoka CowdenWert, Michael B, MD  HYDROcodone-acetaminophen (NORCO/VICODIN) 5-325 MG tablet Take 1-2 tablets by mouth every 4 (four) hours as needed. Patient not taking: Reported on 04/15/2019 10/11/18   Lorre NickAllen, Anthony, MD  ipratropium (ATROVENT) 0.02 % nebulizer solution Take 2.5 mLs (0.5 mg total) by nebulization every 4 (four) hours as needed for wheezing or shortness of breath. Patient not taking: Reported on 04/15/2019 01/25/14   Rhetta MuraSamtani, Jai-Gurmukh, MD  nicotine (NICODERM CQ - DOSED IN MG/24 HR) 7 mg/24hr patch Place 1 patch (7 mg total) onto the skin daily. Patient not taking: Reported on 04/15/2019 01/02/14   Rama, Maryruth Bunhristina P, MD   Physical Exam: Blood pressure (!) 145/85, pulse 91, temperature 98 F (36.7 C), temperature source Oral, resp. rate (!) 22, SpO2 91 %. 1. General:  in No  Acute distress    Chronically ill -appearing 2. Psychological: Alert and  Oriented 3. Head/ENT:     Dry Mucous Membranes                          Head Non traumatic, neck supple                          Poor Dentition 4. SKIN:  decreased Skin turgor,  Skin clean Dry and intact no rash 5. Heart: Regular rate and rhythm no  Murmur, no Rub or gallop 6. Lungs: bilateral extensive  wheezes some crackles   7. Abdomen: Soft, non-tender, Non distended  bowel sounds present 8. Lower extremities: no clubbing, cyanosis, no  edema 9. Neurologically Grossly intact, moving all 4 extremities equally  10. MSK: Normal range of motion   All other LABS:     Recent Labs  Lab 04/15/19 1730  WBC 10.1  NEUTROABS 4.4  HGB 15.1*  HCT 46.1*  MCV 93.7  PLT 288     Recent Labs  Lab 04/15/19 1730  NA 141  K 3.8  CL 106  CO2 24  GLUCOSE 90  BUN 14  CREATININE 0.60  CALCIUM 9.1     No results for input(s): AST, ALT, ALKPHOS, BILITOT, PROT, ALBUMIN in the last 168 hours.      Cultures:    Component Value Date/Time   SDES URINE, CLEAN CATCH 01/22/2014 1041  SPECREQUEST NONE 01/22/2014 1041   CULT  01/22/2014 1036    NORMAL OROPHARYNGEAL FLORA Performed at [place] 01/23/2014 FINAL 01/22/2014 1041     Radiological Exams on Admission: Dg Chest 2 View  Result Date: 04/15/2019 CLINICAL DATA:  63 year old female with history of shortness of breath. EXAM: CHEST - 2 VIEW COMPARISON:  Chest x-ray 02/24/2019. FINDINGS: Lung volumes are normal. No consolidative airspace disease. No pleural effusions. No pneumothorax. No pulmonary nodule or mass noted. Pulmonary vasculature and the cardiomediastinal silhouette are within normal limits. Aortic atherosclerosis IMPRESSION: 1.  No radiographic evidence of acute cardiopulmonary disease. 2. Aortic atherosclerosis. Electronically Signed   By: Vinnie Langton M.D.   On: 04/15/2019 16:19    Chart has been reviewed    Assessment/Plan   63 y.o. female with medical history significant of COPD  Admitted for COPD exacerbation and new Hypoxia  Present on Admission: . COPD exacerbation ([place]) - -  - Will initiate: Steroid taper  -  Antibiotics azythromycine - Albutero PRN, - scheduled duoneb,  -  Breo or Dulera at discharge   -  Mucinex.  Titrate O2 to saturation >90%. Follow patients respiratory status.  VBG with no evidence of hypercarbia or acidemia  Currently mentating well no evidence of symptomatic hypercarbia  . Chest pain likely secondary to respiratory status secondary to cough but given risk factors will obtain cardiac markers and monitor on telemetry . Acute respiratory failure with hypoxia (HCC) secondary to COPD exacerbation may need home oxygen would benefit from PT OT evaluation prior to discharge   and monitor pulse oximetry with ambulation   Other plan as per orders.  DVT prophylaxis:    Lovenox     Code Status:  FULL CODE  as per patient  I had personally discussed CODE  STATUS with patient    Family Communication:   Family not at  Bedside    Disposition Plan:       To home once workup is complete and patient is stable                    Would benefit from PT/OT eval prior to DC  Ordered                                        Consults called: none  Admission status:  ED Disposition    ED Disposition Condition [place]: [place] [[id number]]  Level of Care: Telemetry [5]  Admit to tele based on following criteria: Other see comments  Comments: chest pain  Covid Evaluation: Confirmed COVID Negative  Diagnosis: COPD exacerbation ([place]) [[id number]]  Admitting Physician: Toy Baker [[id number]]  Attending Physician: Toy Baker [[id number]]  Estimated length of stay: 3 - 4 days  Certification:: I certify this patient will need inpatient services for at least 2 midnights  PT Class (Do Not Modify): Inpatient [101]  PT Acc Code (Do Not Modify): Private [1]          inpatient     Expect 2 midnight stay secondary to severity of patient's current illness including   hemodynamic instability despite optimal treatment (  hypoxia,  )     and extensive comorbidities including:    COPD  .    That are currently affecting medical management.   I expect  patient to be hospitalized for  2 midnights requiring inpatient medical care.  Patient is at high risk for adverse outcome (such as loss of life or disability) if not treated.  Indication for inpatient stay as follows:     persistent chest pain despite medical management   New or worsening hypoxia  Need for IV antibiotics, IV fluids,     Level of care     tele  For  24H   Precautions:   NONE   No active isolations  PPE: Used by the provider:   P100  eye Goggles,  Gloves       Yonah Tangeman 04/15/2019, 11:00 PM    [facility]     after 2 AM please page floor coverage PA If 7AM-7PM, please contact the day team taking care of the  patient using [email or web address]

## 2019-04-15 NOTE — ED Notes (Signed)
Pt placed on 2L nasal cannula, pt's o2 had decreased to 86% on room air. Pt is now around 96% on the 2L and pt sts she feels much better with the oxygen on.

## 2019-04-15 NOTE — ED Provider Notes (Signed)
[place] DEPT Provider Note   CSN: [id number] Arrival date & time: 04/15/19  1516     History   Chief Complaint Chief Complaint  Patient presents with  . Shortness of Breath    HPI Angel [name] is a 63 y.o. female.  She has a history of asthma and is a recent former smoker.  She said she was treated for an exacerbation a few months ago and never really cleared and for the last few weeks has been increased shortness of breath.  She does not normally use oxygen and came in here with room air sat of 86%.  She is currently on 2 L and feels much better.  She said a cough productive of some clear sputum and no fever nausea vomiting or diarrhea.  She says her chest hurts when she coughs.  No hemoptysis.     The history is provided by the patient.  Shortness of Breath Severity:  Moderate Onset quality:  Gradual Timing:  Constant Progression:  Worsening Chronicity:  Recurrent Context: activity   Relieved by:  None tried Worsened by:  Activity Ineffective treatments:  Inhaler Associated symptoms: chest pain, cough and wheezing   Associated symptoms: no abdominal pain, no diaphoresis, no fever, no headaches, no hemoptysis, no neck pain, no rash, no sore throat, no syncope and no vomiting     Past Medical History:  Diagnosis Date  . Asthma   . Bronchiolitis     Patient Active Problem List   Diagnosis Date Noted  . Bronchiectasis without acute exacerbation ([place]) 03/06/2014  . Pneumonia 02/19/2014  . Elevated BP 02/19/2014  . COPD (chronic obstructive pulmonary disease) ([place]) 02/19/2014  . Environmental allergies 02/19/2014  . COPD exacerbation ([place]) 01/21/2014  . HCAP (healthcare-associated pneumonia) 01/21/2014  . Smoker 12/31/2013  . Acute respiratory failure with hypoxia ([place]) 12/31/2013  . Asthma exacerbation 12/30/2013  . Asthma exacerbation in COPD ([place]) 12/30/2013    Past Surgical History:  Procedure Laterality Date  . ABDOMINAL  HYSTERECTOMY    . APPENDECTOMY    . BACK SURGERY       OB History   No obstetric history on file.      Home Medications    Prior to Admission medications   Medication Sig Start Date End Date Taking? Authorizing Provider  albuterol (VENTOLIN HFA) 108 (90 Base) MCG/ACT inhaler Inhale 2 puffs into the lungs every 4 (four) hours as needed for wheezing or shortness of breath. 02/24/19  Yes Maudie Flakes, MD  albuterol (PROVENTIL HFA;VENTOLIN HFA) 108 (90 BASE) MCG/ACT inhaler Inhale 1-2 puffs into the lungs every 6 (six) hours as needed for wheezing or shortness of breath. Patient not taking: Reported on 04/15/2019 01/02/14   Rama, Venetia Maxon, MD  albuterol (PROVENTIL) (2.5 MG/3ML) 0.083% nebulizer solution Take 3 mLs (2.5 mg total) by nebulization every 2 (two) hours as needed for wheezing or shortness of breath. Patient not taking: Reported on 04/15/2019 01/25/14   Nita Sells, MD  azithromycin (ZITHROMAX) 250 MG tablet Take 2 tablets together on the first day, then 1 every day until finished. Patient not taking: Reported on 04/15/2019 02/24/19   Maudie Flakes, MD  diazepam (VALIUM) 2 MG tablet Take 1 tablet (2 mg total) by mouth every 6 (six) hours as needed for muscle spasms. Patient not taking: Reported on 04/15/2019 10/11/18   Lacretia Leigh, MD  Fluticasone-Salmeterol (ADVAIR) 250-50 MCG/DOSE AEPB Inhale 1 puff into the lungs every 12 (twelve) hours. Patient not taking: Reported  on 04/15/2019 03/05/14   Nyoka CowdenWert, Diesha Rostad B, MD  HYDROcodone-acetaminophen (NORCO/VICODIN) 5-325 MG tablet Take 1-2 tablets by mouth every 4 (four) hours as needed. Patient not taking: Reported on 04/15/2019 10/11/18   Lorre NickAllen, Anthony, MD  ipratropium (ATROVENT) 0.02 % nebulizer solution Take 2.5 mLs (0.5 mg total) by nebulization every 4 (four) hours as needed for wheezing or shortness of breath. Patient not taking: Reported on 04/15/2019 01/25/14   Rhetta MuraSamtani, Jai-Gurmukh, MD  nicotine (NICODERM CQ - DOSED IN MG/24 HR) 7  mg/24hr patch Place 1 patch (7 mg total) onto the skin daily. Patient not taking: Reported on 04/15/2019 01/02/14   Rama, Maryruth Bunhristina P, MD    Family History Family History  Problem Relation Age of Onset  . CAD Father   . Heart disease Father   . Colon cancer Mother   . Breast cancer Sister   . Emphysema Paternal Grandfather        never smoker, Theatre stage managerfire fighter    Social History Social History   Tobacco Use  . Smoking status: Former Smoker    Packs/day: 1.00    Years: 30.00    Pack years: 30.00    Types: Cigarettes    Quit date: 12/30/2013    Years since quitting: 5.2  . Smokeless tobacco: Never Used  Substance Use Topics  . Alcohol use: Yes    Comment: 3-4 times per wk  . Drug use: No     Allergies   Penicillins   Review of Systems Review of Systems  Constitutional: Negative for diaphoresis and fever.  HENT: Negative for sore throat.   Eyes: Negative for visual disturbance.  Respiratory: Positive for cough, shortness of breath and wheezing. Negative for hemoptysis.   Cardiovascular: Positive for chest pain. Negative for syncope.  Gastrointestinal: Negative for abdominal pain and vomiting.  Genitourinary: Negative for dysuria.  Musculoskeletal: Negative for neck pain.  Skin: Negative for rash.  Neurological: Negative for headaches.     Physical Exam Updated Vital Signs BP (!) 145/85 (BP Location: Right Arm)   Pulse 91   Temp 98 F (36.7 C) (Oral)   Resp (!) 22   SpO2 91%   Physical Exam Vitals signs and nursing note reviewed.  Constitutional:      General: She is not in acute distress.    Appearance: She is well-developed.  HENT:     Head: Normocephalic and atraumatic.  Eyes:     Conjunctiva/sclera: Conjunctivae normal.  Neck:     Musculoskeletal: Neck supple.  Cardiovascular:     Rate and Rhythm: Normal rate and regular rhythm.     Heart sounds: No murmur.  Pulmonary:     Effort: Accessory muscle usage present. No respiratory distress.     Breath  sounds: Examination of the right-upper field reveals wheezing. Examination of the left-upper field reveals wheezing. Examination of the right-middle field reveals wheezing. Examination of the left-middle field reveals wheezing. Examination of the right-lower field reveals wheezing. Examination of the left-lower field reveals wheezing. Wheezing present.  Abdominal:     Palpations: Abdomen is soft.     Tenderness: There is no abdominal tenderness.  Musculoskeletal: Normal range of motion.     Right lower leg: She exhibits no tenderness. No edema.     Left lower leg: She exhibits no tenderness. No edema.  Skin:    General: Skin is warm and dry.     Capillary Refill: Capillary refill takes less than 2 seconds.  Neurological:     General: No focal  deficit present.     Mental Status: She is alert and oriented to person, place, and time.      ED Treatments / Results  Labs (all labs ordered are listed, but only abnormal results are displayed) Labs Reviewed  CBC WITH DIFFERENTIAL/PLATELET - Abnormal; Notable for the following components:      Result Value   Hemoglobin 15.1 (*)    HCT 46.1 (*)    Eosinophils Absolute 1.1 (*)    All other components within normal limits  SARS CORONAVIRUS 2 (HOSPITAL ORDER, PERFORMED IN [facility])  BASIC METABOLIC PANEL    EKG EKG Interpretation  Date/Time:  Wednesday April 15 2019 15:33:41 EDT Ventricular Rate:  88 PR Interval:    QRS Duration: 96 QT Interval:  372 QTC Calculation: 451 R Axis:   82 Text Interpretation:  Sinus rhythm Borderline right axis deviation Minimal ST depression, inferior leads similar to prior 5/20 Confirmed by Meridee ScoreButler, Ricki Vanhandel [phone]([id number]) on 04/15/2019 5:19:31 PM   Radiology Dg Chest 2 View  Result Date: 04/15/2019 CLINICAL DATA:  63 year old female with history of shortness of breath. EXAM: CHEST - 2 VIEW COMPARISON:  Chest x-ray 02/24/2019. FINDINGS: Lung volumes are normal. No consolidative airspace disease. No  pleural effusions. No pneumothorax. No pulmonary nodule or mass noted. Pulmonary vasculature and the cardiomediastinal silhouette are within normal limits. Aortic atherosclerosis IMPRESSION: 1.  No radiographic evidence of acute cardiopulmonary disease. 2. Aortic atherosclerosis. Electronically Signed   By: Trudie Reedaniel  Entrikin M.D.   On: 04/15/2019 16:19    Procedures Procedures (including critical care time)  Medications Ordered in ED Medications  magnesium sulfate IVPB 2 g 50 mL (has no administration in time range)  methylPREDNISolone sodium succinate (SOLU-MEDROL) 125 mg/2 mL injection 125 mg (has no administration in time range)  albuterol (VENTOLIN HFA) 108 (90 Base) MCG/ACT inhaler 4 puff (has no administration in time range)     Initial Impression / Assessment and Plan / ED Course  I have reviewed the triage vital signs and the nursing notes.  Pertinent labs & imaging results that were available during my care of the patient were reviewed by me and considered in my medical decision making (see chart for details).  Clinical Course as of Apr 14 2137  Wed Apr 15, 2019  [id number] 63 year old female with history of asthma and probably undiagnosed COPD here with increased shortness of breath over the course of a few weeks.  She was hypoxic in triage with room air sat 86%.  Improved on oxygen nasal cannula.  She is got diffuse wheezing on exam.  Differential diagnosis includes asthma, COPD, Covid, pneumonia   [MB]  2002 I updated the patient on her results.  We can take her off of oxygen and see if she desats.  She is able to hold her oxygen up will discharge on inhaler and steroids.   [MB]  2020 Patient off of oxygen and is satting 87%.  We will put her in for a trending pulse ox and see how she does with ambulation.   [MB]  2042 Patient's room air sats were 87 to 88%.  We tried to trend her ambulating off of oxygen she desatted to 81%.  She is agreeable to admission.  Hospitalist been paged.    [MB]  2106 Discussed with Dr. Adela Glimpseoutova from the hospitalist service who will evaluate the patient for admission.   [MB]    Clinical Course User Index [MB] Terrilee FilesButler, Kamia Insalaco C, MD   Luisa HartNorma J Cookson was  evaluated in Emergency Department on 04/15/2019 for the symptoms described in the history of present illness. She was evaluated in the context of the global COVID-19 pandemic, which necessitated consideration that the patient might be at risk for infection with the SARS-CoV-2 virus that causes COVID-19. Institutional protocols and algorithms that pertain to the evaluation of patients at risk for COVID-19 are in a state of rapid change based on information released by regulatory bodies including the CDC and federal and state organizations. These policies and algorithms were followed during the patient's care in the ED.      Final Clinical Impressions(s) / ED Diagnoses   Final diagnoses:  COPD exacerbation [facility](HCC)  Hypoxia    ED Discharge Orders    None       Terrilee FilesButler, Vianey Caniglia C, MD 04/15/19 2139

## 2019-04-16 ENCOUNTER — Other Ambulatory Visit: Payer: Self-pay

## 2019-04-16 LAB — TROPONIN I (HIGH SENSITIVITY): Troponin I (High Sensitivity): <2 ng/L (ref <18)

## 2019-04-16 LAB — COMPREHENSIVE METABOLIC PANEL
ALT: 20 U/L (ref 0–44)
AST: 21 U/L (ref 15–41)
Albumin: 3.9 g/dL (ref 3.5–5.0)
Alkaline Phosphatase: 72 U/L (ref 38–126)
Anion gap: 11 (ref 5–15)
BUN: 15 mg/dL (ref 6–20)
CO2: 22 mmol/L (ref 22–32)
Calcium: 8.9 mg/dL (ref 8.9–10.3)
Chloride: 106 mmol/L (ref 98–111)
Creatinine, Ser: 0.69 mg/dL (ref 0.44–1.00)
GFR calc Af Amer: >60 mL/min (ref >60)
GFR calc non Af Amer: >60 mL/min (ref >60)
Glucose, Bld: 136 mg/dL — ABNORMAL HIGH (ref 70–99)
Potassium: 4.3 mmol/L (ref 3.5–5.1)
Sodium: 139 mmol/L (ref 135–145)
Total Bilirubin: 0.3 mg/dL (ref 0.3–1.2)
Total Protein: 7.1 g/dL (ref 6.5–8.1)

## 2019-04-16 LAB — TSH: TSH: 0.643 u[IU]/mL (ref 0.350–4.500)

## 2019-04-16 LAB — CBC
HCT: 44.2 % (ref 36.0–46.0)
Hemoglobin: 14 g/dL (ref 12.0–15.0)
MCH: 30.1 pg (ref 26.0–34.0)
MCHC: 31.7 g/dL (ref 30.0–36.0)
MCV: 95.1 fL (ref 80.0–100.0)
Platelets: 289 10*3/uL (ref 150–400)
RBC: 4.65 MIL/uL (ref 3.87–5.11)
RDW: 13.5 % (ref 11.5–15.5)
WBC: 8.3 10*3/uL (ref 4.0–10.5)
nRBC: 0 % (ref 0.0–0.2)

## 2019-04-16 LAB — MAGNESIUM
Magnesium: 2.3 mg/dL (ref 1.7–2.4)
Magnesium: 2.5 mg/dL — ABNORMAL HIGH (ref 1.7–2.4)

## 2019-04-16 LAB — PHOSPHORUS: Phosphorus: 3.3 mg/dL (ref 2.5–4.6)

## 2019-04-16 LAB — HIV ANTIBODY (ROUTINE TESTING W REFLEX): HIV Screen 4th Generation wRfx: NONREACTIVE

## 2019-04-16 MED ORDER — AMLODIPINE BESYLATE 5 MG PO TABS
5.0000 mg | ORAL_TABLET | Freq: Every day | ORAL | Status: DC
  Administered 2019-04-16 – 2019-04-17 (×2): 5 mg via ORAL
  Filled 2019-04-16 (×2): qty 1

## 2019-04-16 MED ORDER — TRAZODONE HCL 50 MG PO TABS
50.0000 mg | ORAL_TABLET | Freq: Once | ORAL | Status: AC
  Administered 2019-04-16: 50 mg via ORAL
  Filled 2019-04-16: qty 1

## 2019-04-16 MED ORDER — HYDRALAZINE HCL 20 MG/ML IJ SOLN: 10.0000 mg | INTRAMUSCULAR | Status: DC | PRN

## 2019-04-16 NOTE — Progress Notes (Signed)
OT Cancellation Note  Patient Details Name: Angel [name] MRN: [id number] DOB: [birth year]   Cancelled Treatment:    Reason Eval/Treat Not Completed: OT screened, no needs identified, will sign off  [place], Angel [name], [place] [phone] [phone]    04/16/2019, 1:53 PM

## 2019-04-16 NOTE — Plan of Care (Signed)

## 2019-04-16 NOTE — Progress Notes (Signed)
SATURATION QUALIFICATIONS: (This note is used to comply with regulatory documentation for home oxygen)  Patient Saturations on Room Air at Rest = 92%  Patient Saturations on Room Air while Ambulating = 92%  Patient Saturations on  0 Liters of oxygen while Ambulating = 92%  Please briefly explain why patient needs home oxygen:

## 2019-04-16 NOTE — Progress Notes (Signed)
PROGRESS NOTE    MIN TUNNELL  WLN:[id number]  DOB: 1956-07-31  DOA: 04/15/2019 PCP: Patient, No Pcp Per   Brief Admission Hx: 63 yo female with COPD presented with acute COPD exacerbation.   MDM/Assessment & Plan:   1. Acute COPD Exacerbation - Pt still SOB but says she is responding to treatments.  Will start ambulating patient today.  Home O2 evaluation.  Add flutter valve.  Continue IV steroids and continue antibiotics.  Continue  Mucinex.  2. Chest pain -secondary to COPD exacerbation symptoms are better now.  Troponins negative.  3. Acute respiratory failure with hypoxia - treating as above, wean oxygen as able.  PT eval pending.  4. Hypertension - added amlodipine 5 mg daily.    DVT prophylaxis: lovenox  Code Status: Full  Family Communication: patient updated at bedside  Disposition Plan: continue inpatient for IV steroids and respiratory treatments, hopeful for home tomorrow if improved   Consultants:    Procedures:     Subjective: Pt says she remains SOB with non productive cough.  No chest pain today.  Not ambulating.   Objective: Vitals:   04/16/19 0138 04/16/19 0641 04/16/19 0715 04/16/19 0758  BP:  (!) 150/85  (!) 142/96  Pulse:  78  87  Resp:  18    Temp:  97.6 F (36.4 C)  97.7 F (36.5 C)  TempSrc:  Oral  Oral  SpO2: 92% 94% 92% 92%  Weight:      Height:        Intake/Output Summary (Last 24 hours) at 04/16/2019 0948 Last data filed at 04/15/2019 2110 Gross per 24 hour  Intake 50 ml  Output -  Net 50 ml   Filed Weights   04/15/19 2303  Weight: 68.8 kg     REVIEW OF SYSTEMS  As per history otherwise all reviewed and reported negative  Exam:  General exam: awake, alert, NAD cooperative.  Respiratory system: diffuse rales bilateral with poor air movement. Moderate increased work of breathing. Cardiovascular system: S1 & S2 heard. No JVD, murmurs, gallops, clicks or pedal edema. Gastrointestinal system: Abdomen is nondistended, soft  and nontender. Normal bowel sounds heard. Central nervous system: Alert and oriented. No focal neurological deficits. Extremities: no CCE.  Data Reviewed: Basic Metabolic Panel: Recent Labs  Lab 04/15/19 1730 04/15/19 2337 04/16/19 0523  NA 141  --  139  K 3.8  --  4.3  CL 106  --  106  CO2 24  --  22  GLUCOSE 90  --  136*  BUN 14  --  15  CREATININE 0.60  --  0.69  CALCIUM 9.1  --  8.9  MG  --  2.5* 2.3  PHOS  --   --  3.3   Liver Function Tests: Recent Labs  Lab 04/16/19 0523  AST 21  ALT 20  ALKPHOS 72  BILITOT 0.3  PROT 7.1  ALBUMIN 3.9   No results for input(s): LIPASE, AMYLASE in the last 168 hours. No results for input(s): AMMONIA in the last 168 hours. CBC: Recent Labs  Lab 04/15/19 1730 04/15/19 2337 04/16/19 0523  WBC 10.1 8.5 8.3  NEUTROABS 4.4 7.8*  --   HGB 15.1* 15.5* 14.0  HCT 46.1* 46.9* 44.2  MCV 93.7 94.7 95.1  PLT 288 304 289   Cardiac Enzymes: No results for input(s): CKTOTAL, CKMB, CKMBINDEX, TROPONINI in the last 168 hours. CBG (last 3)  No results for input(s): GLUCAP in the last 72 hours. Recent Results (  from the past 240 hour(s))  SARS Coronavirus 2 (CEPHEID - Performed in [facility] lab), Hosp Order     Status: None   Collection Time: 04/15/19  6:06 PM   Specimen: Nasopharyngeal Swab  Result Value Ref Range Status   SARS Coronavirus 2 NEGATIVE NEGATIVE Final    Comment: (NOTE) If result is NEGATIVE SARS-CoV-2 target nucleic acids are NOT DETECTED. The SARS-CoV-2 RNA is generally detectable in upper and lower  respiratory specimens during the acute phase of infection. The lowest  concentration of SARS-CoV-2 viral copies this assay can detect is 250  copies / mL. A negative result does not preclude SARS-CoV-2 infection  and should not be used as the sole basis for treatment or other  patient management decisions.  A negative result may occur with  improper specimen collection / handling, submission of specimen other   than nasopharyngeal swab, presence of viral mutation(s) within the  areas targeted by this assay, and inadequate number of viral copies  (<250 copies / mL). A negative result must be combined with clinical  observations, patient history, and epidemiological information. If result is POSITIVE SARS-CoV-2 target nucleic acids are DETECTED. The SARS-CoV-2 RNA is generally detectable in upper and lower  respiratory specimens dur ing the acute phase of infection.  Positive  results are indicative of active infection with SARS-CoV-2.  Clinical  correlation with patient history and other diagnostic information is  necessary to determine patient infection status.  Positive results do  not rule out bacterial infection or co-infection with other viruses. If result is PRESUMPTIVE POSTIVE SARS-CoV-2 nucleic acids MAY BE PRESENT.   A presumptive positive result was obtained on the submitted specimen  and confirmed on repeat testing.  While 2019 novel coronavirus  (SARS-CoV-2) nucleic acids may be present in the submitted sample  additional confirmatory testing may be necessary for epidemiological  and / or clinical management purposes  to differentiate between  SARS-CoV-2 and other Sarbecovirus currently known to infect humans.  If clinically indicated additional testing with an alternate test  methodology [id number]) is advised. The SARS-CoV-2 RNA is generally  detectable in upper and lower respiratory sp ecimens during the acute  phase of infection. The expected result is Negative. Fact Sheet for Patients:  BoilerBrush.com.cyhttps://www.fda.gov/media/136312/download Fact Sheet for Healthcare Providers: https://pope.com/https://www.fda.gov/media/136313/download This test is not yet approved or cleared by the [place] FDA and has been authorized for detection and/or diagnosis of SARS-CoV-2 by FDA under an Emergency Use Authorization (EUA).  This EUA will remain in effect (meaning this test can be used) for the duration of the  COVID-19 declaration under Section 564(b)(1) of the Act, 21 U.S.C. section 360bbb-3(b)(1), unless the authorization is terminated or revoked sooner. Performed at [facility], [address] [place], [place] [address]      Studies: Dg Chest 2 View  Result Date: 04/15/2019 CLINICAL DATA:  63 year old female with history of shortness of breath. EXAM: CHEST - 2 VIEW COMPARISON:  Chest x-ray 02/24/2019. FINDINGS: Lung volumes are normal. No consolidative airspace disease. No pleural effusions. No pneumothorax. No pulmonary nodule or mass noted. Pulmonary vasculature and the cardiomediastinal silhouette are within normal limits. Aortic atherosclerosis IMPRESSION: 1.  No radiographic evidence of acute cardiopulmonary disease. 2. Aortic atherosclerosis. Electronically Signed   By: Trudie Reedaniel  Entrikin M.D.   On: 04/15/2019 16:19     Scheduled Meds: . amLODipine  5 mg Oral Daily  . azithromycin  500 mg Oral Daily  . enoxaparin (LOVENOX) injection  40 mg  Subcutaneous QHS  . guaiFENesin  600 mg Oral BID  . ipratropium-albuterol  3 mL Nebulization Q6H  . methylPREDNISolone (SOLU-MEDROL) injection  60 mg Intravenous Q12H   Followed by  . [START ON 04/17/2019] predniSONE  40 mg Oral Q breakfast  . mometasone-formoterol  1 puff Inhalation BID  . sodium chloride flush  3 mL Intravenous Q12H   Continuous Infusions:  Active Problems:   Acute respiratory failure with hypoxia (HCC)   COPD exacerbation (HCC)   Chest pain   Time spent:   Standley Dakinslanford Ysabelle Goodroe, MD [facility] 04/16/2019, 9:48 AM    LOS: 1 day  How to contact the [facility] Attending or Consulting provider 7A - 7P or covering provider during after hours 7P -7A, for this patient?  1. Check the care team in [facility] and look for a) attending/consulting TR[facility] provider listed and b) the [facility] team listed 2. Log into [email or web address] and use [facility]'s universal password to access. If you do not have the password, please contact the  hospital operator. 3. Locate the [facility] provider you are looking for under [facility] and page to a number that you can be directly reached. 4. If you still have difficulty reaching the provider, please page the [facility] (Director on Call) for the Hospitalists listed on amion for assistance.

## 2019-04-16 NOTE — Progress Notes (Signed)
PT Cancellation Note  Patient Details Name: Angel [name] MRN: [id number] DOB: 07/26/1956   Cancelled Treatment:    Reason Eval/Treat Not Completed: PT screened, no needs identified, will sign off; reports just walked in hallway with nursing and feels she is fine without PT needs. RN agreed.  Will sign off.    [name] 04/16/2019, 1:12 PM  [name], [name] [phone] 04/16/2019

## 2019-04-17 LAB — BASIC METABOLIC PANEL
Anion gap: 9 (ref 5–15)
BUN: 18 mg/dL (ref 6–20)
CO2: 22 mmol/L (ref 22–32)
Calcium: 9 mg/dL (ref 8.9–10.3)
Chloride: 108 mmol/L (ref 98–111)
Creatinine, Ser: 0.69 mg/dL (ref 0.44–1.00)
GFR calc Af Amer: >60 mL/min (ref >60)
GFR calc non Af Amer: >60 mL/min (ref >60)
Glucose, Bld: 167 mg/dL — ABNORMAL HIGH (ref 70–99)
Potassium: 4.3 mmol/L (ref 3.5–5.1)
Sodium: 139 mmol/L (ref 135–145)

## 2019-04-17 LAB — MAGNESIUM: Magnesium: 2.3 mg/dL (ref 1.7–2.4)

## 2019-04-17 MED ORDER — AMLODIPINE BESYLATE 5 MG PO TABS: 5.0000 mg | ORAL_TABLET | Freq: Every day | ORAL | 0 refills | Status: DC

## 2019-04-17 MED ORDER — AZITHROMYCIN 250 MG PO TABS: 250.0000 mg | ORAL_TABLET | Freq: Every day | ORAL | 0 refills | Status: AC

## 2019-04-17 MED ORDER — FLUTICASONE-SALMETEROL 250-50 MCG/DOSE IN AEPB: 1.0000 | INHALATION_SPRAY | Freq: Two times a day (BID) | RESPIRATORY_TRACT | 0 refills | Status: DC

## 2019-04-17 MED ORDER — IPRATROPIUM-ALBUTEROL 0.5-2.5 (3) MG/3ML IN SOLN: 3.0000 mL | RESPIRATORY_TRACT | 0 refills | Status: DC | PRN

## 2019-04-17 MED ORDER — ALBUTEROL SULFATE HFA 108 (90 BASE) MCG/ACT IN AERS: 2.0000 | INHALATION_SPRAY | Freq: Four times a day (QID) | RESPIRATORY_TRACT | 1 refills | Status: DC | PRN

## 2019-04-17 MED ORDER — GUAIFENESIN ER 600 MG PO TB12: 600.0000 mg | ORAL_TABLET | Freq: Two times a day (BID) | ORAL | 0 refills | Status: AC

## 2019-04-17 MED ORDER — PREDNISONE 20 MG PO TABS: 40.0000 mg | ORAL_TABLET | Freq: Every day | ORAL | 0 refills | Status: AC

## 2019-04-17 NOTE — TOC Initial Note (Signed)
Transition of Care [facility](TOC) - Initial/Assessment Note    Patient Details  Name: Angel [name] MRN: [id number] Date of Birth: 1956/01/23  Transition of Care [facility](TOC) CM/SW Contact:    Lanier ClamMahabir, Dhani Dannemiller, RN Phone Number: 04/17/2019, 10:17 AM  Clinical Narrative: No health insurance,or pcp. From home, works, has own transport home.Provided w/health insurance info, encouraged pcp-[facility](patient will call on own),Encouraged patient to make appt now then [facility]-she agreed.She is able to afford her meds through her friends. Ordered for neb machine-Zach aware to deliver to rm prior d/c. No further CM needs.                 Expected Discharge Plan: Home/Self Care Barriers to Discharge: No Barriers Identified   Patient Goals and CMS Choice        Expected Discharge Plan and Services Expected Discharge Plan: Home/Self Care In-house Referral: Clinical Social Work, PCP / Management consultantHealth Connect, Artistinancial Counselor Discharge Planning Services: CM Consult, [facility]   Living arrangements for the past 2 months: Single Family Home Expected Discharge Date: 04/17/19               DME Arranged: Nebulizer machine DME Agency: [employer] Date DME Agency Contacted: 04/17/19 Time DME Agency Contacted: [phone] Representative spoke with at DME Agency: Ian MalkinZach            Prior Living Arrangements/Services Living arrangements for the past 2 months: Single Family Home Lives with:: Self Patient language and need for interpreter reviewed:: Yes Do you feel safe going back to the place where you live?: Yes      Need for Family Participation in Patient Care: No (Comment) Care giver support system in place?: Yes (comment)   Criminal Activity/Legal Involvement Pertinent to Current Situation/Hospitalization: No - Comment as needed  Activities of Daily Living Home Assistive Devices/Equipment: None ADL Screening (condition at time of admission) Patient's  cognitive ability adequate to safely complete daily activities?: Yes Is the patient deaf or have difficulty hearing?: No Does the patient have difficulty seeing, even when wearing glasses/contacts?: No Does the patient have difficulty concentrating, remembering, or making decisions?: No Patient able to express need for assistance with ADLs?: Yes Does the patient have difficulty dressing or bathing?: No Independently performs ADLs?: Yes (appropriate for developmental age) Does the patient have difficulty walking or climbing stairs?: No Weakness of Legs: None Weakness of Arms/Hands: None  Permission Sought/Granted Permission sought to share information with : Case Manager Permission granted to share information with : Yes, Verbal Permission Granted     Permission granted to share info w AGENCY: [employer]        Emotional Assessment Appearance:: Appears stated age Attitude/Demeanor/Rapport: Gracious   Orientation: : Oriented to Self, Oriented to Place, Oriented to  Time, Oriented to Situation Alcohol / Substance Use: Tobacco Use, Alcohol Use Psych Involvement: No (comment)  Admission diagnosis:  Hypoxia [R09.02] COPD exacerbation (HCC) [J44.1] Patient Active Problem List   Diagnosis Date Noted  . Chest pain 04/15/2019  . Bronchiectasis without acute exacerbation (HCC) 03/06/2014  . Pneumonia 02/19/2014  . Elevated BP 02/19/2014  . COPD (chronic obstructive pulmonary disease) (HCC) 02/19/2014  . Environmental allergies 02/19/2014  . COPD exacerbation (HCC) 01/21/2014  . HCAP (healthcare-associated pneumonia) 01/21/2014  . Smoker 12/31/2013  . Acute respiratory failure with hypoxia (HCC) 12/31/2013  . Asthma exacerbation 12/30/2013  . Asthma exacerbation in COPD (HCC) 12/30/2013   PCP:  Patient, No Pcp Per Pharmacy:  [place], [place] - [address] [place] [address] Phone: [phone] Fax: [place], [address] AT [place] [address] Phone: [phone] Fax: [place], [address] [place] [address] Phone: [phone] Fax: [phone]     Social Determinants of Health (SDOH) Interventions    Readmission Risk Interventions No flowsheet data found.

## 2019-04-17 NOTE — Discharge Instructions (Signed)
Chronic Obstructive Pulmonary Disease Exacerbation  Chronic obstructive pulmonary disease (COPD) is a long-term (chronic) condition that affects the lungs. COPD is a general term that can be used to describe many different lung problems that cause lung swelling (inflammation) and limit airflow, including chronic bronchitis and emphysema. COPD exacerbations are episodes when breathing symptoms become much worse and require extra treatment. COPD exacerbations are usually caused by infections. Without treatment, COPD exacerbations can be severe and even life threatening. Frequent COPD exacerbations can cause further damage to the lungs. What are the causes? This condition may be caused by:  Respiratory infections, including viral and bacterial infections.  Exposure to smoke.  Exposure to air pollution, chemical fumes, or dust.  Things that give you an allergic reaction (allergens).  Not taking your usual COPD medicines as directed.  Underlying medical problems, such as congestive heart failure or infections not involving the lungs. In many cases, the cause (trigger) of this condition is not known. What increases the risk? The following factors may make you more likely to develop this condition:  Smoking cigarettes.  Old age.  Frequent prior COPD exacerbations. What are the signs or symptoms? Symptoms of this condition include:  Increased coughing.  Increased production of mucus from your lungs (sputum).  Increased wheezing.  Increased shortness of breath.  Rapid or labored breathing.  Chest tightness.  Less energy than usual.  Sleep disruption from symptoms.  Confusion or increased sleepiness. Often these symptoms happen or get worse even with the use of medicines. How is this diagnosed? This condition is diagnosed based on:  Your medical history.  A physical exam. You may also have tests, including:  A chest X-ray.  Blood tests.  Lung (pulmonary) function  tests. How is this treated? Treatment for this condition depends on the severity and cause of the symptoms. You may need to be admitted to a hospital for treatment. Some of the treatments commonly used to treat COPD exacerbations are:  Antibiotic medicines. These may be used for severe exacerbations caused by a lung infection, such as pneumonia.  Bronchodilators. These are inhaled medicines that expand the air passages and allow increased airflow.  Steroid medicines. These act to reduce inflammation in the airways. They may be given with an inhaler, taken by mouth, or given through an IV tube inserted into one of your veins.  Supplemental oxygen therapy.  Airway clearing techniques, such as noninvasive ventilation (NIV) and positive expiratory pressure (PEP). These provide respiratory support through a mask or other noninvasive device. An example of this would be using a continuous positive airway pressure (CPAP) machine to improve delivery of oxygen into your lungs. Follow these instructions at home: Medicines  Take over-the-counter and prescription medicines only as told by your health care provider. It is important to use correct technique with inhaled medicines.  If you were prescribed an antibiotic medicine or oral steroid, take it as told by your health care provider. Do not stop taking the medicine even if you start to feel better. Lifestyle  Eat a healthy diet.  Exercise regularly.  Get plenty of sleep.  Avoid exposure to all substances that irritate the airway, especially to tobacco smoke.  Wash your hands often with soap and water to reduce the risk of infection. If soap and water are not available, use hand sanitizer.  During flu season, avoid enclosed spaces that are crowded with people. General instructions  Drink enough fluid to keep your urine clear or pale yellow (unless you have a medical  condition that requires fluid restriction).  Use a cool mist vaporizer. This  humidifies the air and makes it easier for you to clear your chest when you cough.  If you have a home nebulizer and oxygen, continue to use them as told by your health care provider.  Keep all follow-up visits as told by your health care provider. This is important. How is this prevented?  Stay up-to-date on pneumococcal and influenza (flu) vaccines. A flu shot is recommended every year to help prevent exacerbations.  Do not use any products that contain nicotine or tobacco, such as cigarettes and e-cigarettes. Quitting smoking is very important in preventing COPD from getting worse and in preventing exacerbations from happening as often. If you need help quitting, ask your health care provider.  Follow all instructions for pulmonary rehabilitation after a recent exacerbation. This can help prevent future exacerbations.  Work with your health care provider to develop and follow an action plan. This tells you what steps to take when you experience certain symptoms. Contact a health care provider if:  You have a worsening of your regular COPD symptoms. Get help right away if:  You have worsening shortness of breath, even when resting.  You have trouble talking.  You have severe chest pain.  You cough up blood.  You have a fever.  You have weakness, vomit repeatedly, or faint.  You feel confused.  You are not able to sleep because of your symptoms.  You have trouble doing daily activities. Summary  COPD exacerbations are episodes when breathing symptoms become much worse and require extra treatment above your normal treatment.  Exacerbations can be severe and even life threatening. Frequent COPD exacerbations can cause further damage to your lungs.  COPD exacerbations are usually triggered by infections such as the flu, colds, and even pneumonia.  Treatment for this condition depends on the severity and cause of the symptoms. You may need to be admitted to a hospital for  treatment.  Quitting smoking is very important to prevent COPD from getting worse and to prevent exacerbations from happening as often. This information is not intended to replace advice given to you by your health care provider. Make sure you discuss any questions you have with your health care provider. Document Released: 07/22/2007 Document Revised: 09/06/2017 Document Reviewed: 10/29/2016 Elsevier Patient Education  2020 [employer]   IMPORTANT INFORMATION: PAY CLOSE ATTENTION   PHYSICIAN DISCHARGE INSTRUCTIONS  Follow with Primary care provider  Patient, No Pcp Per  and other consultants as instructed by your Hospitalist Physician  SEEK MEDICAL CARE OR RETURN TO EMERGENCY ROOM IF SYMPTOMS COME BACK, WORSEN OR NEW PROBLEM DEVELOPS   Please note: You were cared for by a hospitalist during your hospital stay. Every effort will be made to forward records to your primary care provider.  You can request that your primary care provider send for your hospital records if they have not received them.  Once you are discharged, your primary care physician will handle any further medical issues. Please note that NO REFILLS for any discharge medications will be authorized once you are discharged, as it is imperative that you return to your primary care physician (or establish a relationship with a primary care physician if you do not have one) for your post hospital discharge needs so that they can reassess your need for medications and monitor your lab values.  Please get a complete blood count and chemistry panel checked by your Primary MD at your next visit,  and again as instructed by your Primary MD.  Get Medicines reviewed and adjusted: Please take all your medications with you for your next visit with your Primary MD  Laboratory/radiological data: Please request your Primary MD to go over all hospital tests and procedure/radiological results at the follow up, please ask your primary care  provider to get all Hospital records sent to his/her office.  In some cases, they will be blood work, cultures and biopsy results pending at the time of your discharge. Please request that your primary care provider follow up on these results.  If you are diabetic, please bring your blood sugar readings with you to your follow up appointment with primary care.    Please call and make your follow up appointments as soon as possible.    Also Note the following: If you experience worsening of your admission symptoms, develop shortness of breath, life threatening emergency, suicidal or homicidal thoughts you must seek medical attention immediately by calling 911 or calling your MD immediately  if symptoms less severe.  You must read complete instructions/literature along with all the possible adverse reactions/side effects for all the Medicines you take and that have been prescribed to you. Take any new Medicines after you have completely understood and accpet all the possible adverse reactions/side effects.   Do not drive when taking Pain medications or sleeping medications (Benzodiazepines)  Do not take more than prescribed Pain, Sleep and Anxiety Medications. It is not advisable to combine anxiety,sleep and pain medications without talking with your primary care practitioner  Special Instructions: If you have smoked or chewed Tobacco  in the last 2 yrs please stop smoking, stop any regular Alcohol  and or any Recreational drug use.  Wear Seat belts while driving.  Do not drive if taking any narcotic, mind altering or controlled substances or recreational drugs or alcohol.

## 2019-04-17 NOTE — Discharge Summary (Signed)
Physician Discharge Summary  Angel [name] ZOX:[id number] DOB: 01/21/1956 DOA: 04/15/2019  [facility]  Pulmonologist: Wert   Admit date: 04/15/2019 Discharge date: 04/17/2019  Admitted From: Home  Disposition: Home   Recommendations for Outpatient Follow-up:  1. Follow up with PCP in 1 weeks 2. Follow up with pulmonologist in 2 weeks  Discharge Condition: Stable   CODE STATUS: Full    Brief Hospitalization Summary: Please see all hospital notes, images, labs for full details of the hospitalization. Dr. Celene Krasoutova's HPI: Angel Hartorma J Stenner is a 63 y.o. female with medical history significant of COPD  Presented with 1 week of worsening cough shortness of breath, dyspnea with exertion  He was diagnosed with COPD exacerbation given steroids and inhaler but she continues to be short of breath today was hypoxic at home down to 89% room air which is concerning she was worried she has COVID-19  She also endorsing chest pain worse with breathing and coughing over right side of her chest No hemoptysis, no leg swelling no travel Stopped smoking few months ago She finished her steroids few months ago but never got better She has been wheezing She does not smoke but roommate does  Infectious risk factors:  Reports shortness of breath, dry cough, chest pain,  sick contacts, known COVID 19 exposure, Travel to high risk area, Exposure to travelers from high risk area   In  ER RAPID COVID TEST NEGATIVE   Regarding pertinent Chronic problems:     COPD - followed by pulmonology    While in ER: Started on 2 L and was satting up to 96% Patient desaturated with walking down to 83% The following Work up has been ordered so far:  Brief Admission Hx: 63 yo female with COPD presented with acute COPD exacerbation.   MDM/Assessment & Plan:   1. Acute COPD Exacerbation -Pt is off oxygen now, feeling and breathing much better and wants to go home.  Will discharge home on prednisone 40 mg daily for additional 4  days, home nebulizer with duonebs, resume with new prescription for advair 250/50 BID, albuterol HFA rescue inhaler, azithromycin x 2 more days, mucinex.    Does not need home oxygen.  Avoid second hand smoke exposure.  Follow up with PCP and Dr. Sherene SiresWert with pulmonology.   Continue flutter valve.   2. Chest pain -secondary to COPD exacerbation symptoms are better now.  Troponins negative.  3. Acute respiratory failure with hypoxia - Resolved now with treatments.     4. Hypertension - added amlodipine 5 mg daily and blood pressure better controlled.    DVT prophylaxis: lovenox  Code Status: Full  Family Communication: patient updated at bedside  Disposition Plan: Home   Discharge Diagnoses:  Active Problems:   Acute respiratory failure with hypoxia (HCC)   COPD exacerbation (HCC)   Chest pain   Discharge Instructions: Discharge Instructions    Call MD for:  difficulty breathing, headache or visual disturbances   Complete by: As directed    Call MD for:  extreme fatigue   Complete by: As directed    Call MD for:  persistant dizziness or light-headedness   Complete by: As directed    Call MD for:  persistant nausea and vomiting   Complete by: As directed    Diet - low sodium heart healthy   Complete by: As directed    Increase activity slowly   Complete by: As directed      Allergies as of 04/17/2019  Reactions   Penicillins Hives      Medication List    STOP taking these medications   diazepam 2 MG tablet Commonly known as: Valium   HYDROcodone-acetaminophen 5-325 MG tablet Commonly known as: NORCO/VICODIN   ipratropium 0.02 % nebulizer solution Commonly known as: ATROVENT   nicotine 7 mg/24hr patch Commonly known as: NICODERM CQ - dosed in mg/24 hr     TAKE these medications   albuterol 108 (90 Base) MCG/ACT inhaler Commonly known as: VENTOLIN HFA Inhale 2 puffs into the lungs every 6 (six) hours as needed for wheezing or shortness of breath. What changed:    how much to take  Another medication with the same name was removed. Continue taking this medication, and follow the directions you see here.   amLODipine 5 MG tablet Commonly known as: NORVASC Take 1 tablet (5 mg total) by mouth daily. Start taking on: April 18, 2019   azithromycin 250 MG tablet Commonly known as: ZITHROMAX Take 1 tablet (250 mg total) by mouth daily for 2 days. Take 2 tablets together on the first day, then 1 every day until finished. Start taking on: April 18, 2019 What changed:   how much to take  how to take this  when to take this   Fluticasone-Salmeterol 250-50 MCG/DOSE Aepb Commonly known as: ADVAIR Inhale 1 puff into the lungs every 12 (twelve) hours.   guaiFENesin 600 MG 12 hr tablet Commonly known as: MUCINEX Take 1 tablet (600 mg total) by mouth 2 (two) times daily for 7 days.   ipratropium-albuterol 0.5-2.5 (3) MG/3ML Soln Commonly known as: DUONEB Take 3 mLs by nebulization every 4 (four) hours as needed.   predniSONE 20 MG tablet Commonly known as: DELTASONE Take 2 tablets (40 mg total) by mouth daily with breakfast for 4 days. Start taking on: April 18, 2019            Durable Medical Equipment  (From admission, onward)         Start     Ordered   04/17/19 0930  For home use only DME Nebulizer machine  Once    Question Answer Comment  Patient needs a nebulizer to treat with the following condition COPD (chronic obstructive pulmonary disease) ([place])   Length of Need Lifetime      04/17/19 0929         Follow-up Information    Primary Care Provider. Schedule an appointment as soon as possible for a visit in 1 week(s).   Why: Hospital Follow Up        Tanda Rockers, MD. Schedule an appointment as soon as possible for a visit in 2 week(s).   Specialty: Pulmonary Disease Why: Hospital Follow Up  Contact information: [place] [id number] [phone]          Allergies  Allergen  Reactions  . Penicillins Hives   Allergies as of 04/17/2019      Reactions   Penicillins Hives      Medication List    STOP taking these medications   diazepam 2 MG tablet Commonly known as: Valium   HYDROcodone-acetaminophen 5-325 MG tablet Commonly known as: NORCO/VICODIN   ipratropium 0.02 % nebulizer solution Commonly known as: ATROVENT   nicotine 7 mg/24hr patch Commonly known as: NICODERM CQ - dosed in mg/24 hr     TAKE these medications   albuterol 108 (90 Base) MCG/ACT inhaler Commonly known as: VENTOLIN HFA Inhale 2 puffs into the  lungs every 6 (six) hours as needed for wheezing or shortness of breath. What changed:   how much to take  Another medication with the same name was removed. Continue taking this medication, and follow the directions you see here.   amLODipine 5 MG tablet Commonly known as: NORVASC Take 1 tablet (5 mg total) by mouth daily. Start taking on: April 18, 2019   azithromycin 250 MG tablet Commonly known as: ZITHROMAX Take 1 tablet (250 mg total) by mouth daily for 2 days. Take 2 tablets together on the first day, then 1 every day until finished. Start taking on: April 18, 2019 What changed:   how much to take  how to take this  when to take this   Fluticasone-Salmeterol 250-50 MCG/DOSE Aepb Commonly known as: ADVAIR Inhale 1 puff into the lungs every 12 (twelve) hours.   guaiFENesin 600 MG 12 hr tablet Commonly known as: MUCINEX Take 1 tablet (600 mg total) by mouth 2 (two) times daily for 7 days.   ipratropium-albuterol 0.5-2.5 (3) MG/3ML Soln Commonly known as: DUONEB Take 3 mLs by nebulization every 4 (four) hours as needed.   predniSONE 20 MG tablet Commonly known as: DELTASONE Take 2 tablets (40 mg total) by mouth daily with breakfast for 4 days. Start taking on: April 18, 2019            Durable Medical Equipment  (From admission, onward)         Start     Ordered   04/17/19 0930  For home use only DME  Nebulizer machine  Once    Question Answer Comment  Patient needs a nebulizer to treat with the following condition COPD (chronic obstructive pulmonary disease) (HCC)   Length of Need Lifetime      04/17/19 [id number]          Procedures/Studies: Dg Chest 2 View  Result Date: 04/15/2019 CLINICAL DATA:  63 year old female with history of shortness of breath. EXAM: CHEST - 2 VIEW COMPARISON:  Chest x-ray 02/24/2019. FINDINGS: Lung volumes are normal. No consolidative airspace disease. No pleural effusions. No pneumothorax. No pulmonary nodule or mass noted. Pulmonary vasculature and the cardiomediastinal silhouette are within normal limits. Aortic atherosclerosis IMPRESSION: 1.  No radiographic evidence of acute cardiopulmonary disease. 2. Aortic atherosclerosis. Electronically Signed   By: Trudie Reedaniel  Entrikin M.D.   On: 04/15/2019 16:19      Subjective: Pt reports that she is breathing better today.  No SOB and no CP.  Pt has been ambulating in room.  Pt is weaned off oxygen.   Discharge Exam: Vitals:   04/17/19 0613 04/17/19 0707  BP: 138/80   Pulse: 72   Resp: 18   Temp: (!) 97.5 F (36.4 C)   SpO2: 96% 92%   Vitals:   04/16/19 1939 04/16/19 2200 04/17/19 0613 04/17/19 0707  BP:  127/71 138/80   Pulse:  91 72   Resp:  18 18   Temp:  98.2 F (36.8 C) (!) 97.5 F (36.4 C)   TempSrc:  Oral Oral   SpO2: 94% 95% 96% 92%  Weight:      Height:       General: Pt is alert, awake, not in acute distress Cardiovascular: RRR, S1/S2 +, no rubs, no gallops Respiratory: CTA bilaterally, no wheezing, no rhonchi Abdominal: Soft, NT, ND, bowel sounds + Extremities: no edema, no cyanosis   The results of significant diagnostics from this hospitalization (including imaging, microbiology, ancillary and laboratory) are listed below for  reference.     Microbiology: Recent Results (from the past 240 hour(s))  SARS Coronavirus 2 (CEPHEID - Performed in [facility] hospital lab), Hosp Order      Status: None   Collection Time: 04/15/19  6:06 PM   Specimen: Nasopharyngeal Swab  Result Value Ref Range Status   SARS Coronavirus 2 NEGATIVE NEGATIVE Final    Comment: (NOTE) If result is NEGATIVE SARS-CoV-2 target nucleic acids are NOT DETECTED. The SARS-CoV-2 RNA is generally detectable in upper and lower  respiratory specimens during the acute phase of infection. The lowest  concentration of SARS-CoV-2 viral copies this assay can detect is 250  copies / mL. A negative result does not preclude SARS-CoV-2 infection  and should not be used as the sole basis for treatment or other  patient management decisions.  A negative result may occur with  improper specimen collection / handling, submission of specimen other  than nasopharyngeal swab, presence of viral mutation(s) within the  areas targeted by this assay, and inadequate number of viral copies  (<250 copies / mL). A negative result must be combined with clinical  observations, patient history, and epidemiological information. If result is POSITIVE SARS-CoV-2 target nucleic acids are DETECTED. The SARS-CoV-2 RNA is generally detectable in upper and lower  respiratory specimens dur ing the acute phase of infection.  Positive  results are indicative of active infection with SARS-CoV-2.  Clinical  correlation with patient history and other diagnostic information is  necessary to determine patient infection status.  Positive results do  not rule out bacterial infection or co-infection with other viruses. If result is PRESUMPTIVE POSTIVE SARS-CoV-2 nucleic acids MAY BE PRESENT.   A presumptive positive result was obtained on the submitted specimen  and confirmed on repeat testing.  While 2019 novel coronavirus  (SARS-CoV-2) nucleic acids may be present in the submitted sample  additional confirmatory testing may be necessary for epidemiological  and / or clinical management purposes  to differentiate between  SARS-CoV-2 and other  Sarbecovirus currently known to infect humans.  If clinically indicated additional testing with an alternate test  methodology [phone]) is advised. The SARS-CoV-2 RNA is generally  detectable in upper and lower respiratory sp ecimens during the acute  phase of infection. The expected result is Negative. Fact Sheet for Patients:  BoilerBrush.com.cy Fact Sheet for Healthcare Providers: https://pope.com/ This test is not yet approved or cleared by the [place] FDA and has been authorized for detection and/or diagnosis of SARS-CoV-2 by FDA under an Emergency Use Authorization (EUA).  This EUA will remain in effect (meaning this test can be used) for the duration of the COVID-19 declaration under Section 564(b)(1) of the Act, 21 U.S.C. section 360bbb-3(b)(1), unless the authorization is terminated or revoked sooner. Performed at [facility], [address] [place], [place] [address]      Labs: BNP (last 3 results) No results for input(s): BNP in the last 8760 hours. Basic Metabolic Panel: Recent Labs  Lab 04/15/19 1730 04/15/19 2337 04/16/19 0523 04/17/19 0514  NA 141  --  139 139  K 3.8  --  4.3 4.3  CL 106  --  106 108  CO2 24  --  22 22  GLUCOSE 90  --  136* 167*  BUN 14  --  15 18  CREATININE 0.60  --  0.69 0.69  CALCIUM 9.1  --  8.9 9.0  MG  --  2.5* 2.3 2.3  PHOS  --   --  3.3  --    Liver Function Tests: Recent Labs  Lab 04/16/19 0523  AST 21  ALT 20  ALKPHOS 72  BILITOT 0.3  PROT 7.1  ALBUMIN 3.9   No results for input(s): LIPASE, AMYLASE in the last 168 hours. No results for input(s): AMMONIA in the last 168 hours. CBC: Recent Labs  Lab 04/15/19 1730 04/15/19 2337 04/16/19 0523  WBC 10.1 8.5 8.3  NEUTROABS 4.4 7.8*  --   HGB 15.1* 15.5* 14.0  HCT 46.1* 46.9* 44.2  MCV 93.7 94.7 95.1  PLT 288 304 289   Cardiac Enzymes: No results for input(s): CKTOTAL, CKMB, CKMBINDEX,  TROPONINI in the last 168 hours. BNP: Invalid input(s): POCBNP CBG: No results for input(s): GLUCAP in the last 168 hours. D-Dimer Recent Labs    04/15/19 2153  DDIMER <0.27   Hgb A1c No results for input(s): HGBA1C in the last 72 hours. Lipid Profile No results for input(s): CHOL, HDL, LDLCALC, TRIG, CHOLHDL, LDLDIRECT in the last 72 hours. Thyroid function studies Recent Labs    04/16/19 0523  TSH 0.643   Anemia work up No results for input(s): VITAMINB12, FOLATE, FERRITIN, TIBC, IRON, RETICCTPCT in the last 72 hours. Urinalysis No results found for: COLORURINE, APPEARANCEUR, LABSPEC, PHURINE, GLUCOSEU, HGBUR, BILIRUBINUR, KETONESUR, PROTEINUR, UROBILINOGEN, NITRITE, LEUKOCYTESUR Sepsis Labs Invalid input(s): PROCALCITONIN,  WBC,  LACTICIDVEN Microbiology Recent Results (from the past 240 hour(s))  SARS Coronavirus 2 (CEPHEID - Performed in [facility] hospital lab), Hosp Order     Status: None   Collection Time: 04/15/19  6:06 PM   Specimen: Nasopharyngeal Swab  Result Value Ref Range Status   SARS Coronavirus 2 NEGATIVE NEGATIVE Final    Comment: (NOTE) If result is NEGATIVE SARS-CoV-2 target nucleic acids are NOT DETECTED. The SARS-CoV-2 RNA is generally detectable in upper and lower  respiratory specimens during the acute phase of infection. The lowest  concentration of SARS-CoV-2 viral copies this assay can detect is 250  copies / mL. A negative result does not preclude SARS-CoV-2 infection  and should not be used as the sole basis for treatment or other  patient management decisions.  A negative result may occur with  improper specimen collection / handling, submission of specimen other  than nasopharyngeal swab, presence of viral mutation(s) within the  areas targeted by this assay, and inadequate number of viral copies  (<250 copies / mL). A negative result must be combined with clinical  observations, patient history, and epidemiological information. If  result is POSITIVE SARS-CoV-2 target nucleic acids are DETECTED. The SARS-CoV-2 RNA is generally detectable in upper and lower  respiratory specimens dur ing the acute phase of infection.  Positive  results are indicative of active infection with SARS-CoV-2.  Clinical  correlation with patient history and other diagnostic information is  necessary to determine patient infection status.  Positive results do  not rule out bacterial infection or co-infection with other viruses. If result is PRESUMPTIVE POSTIVE SARS-CoV-2 nucleic acids MAY BE PRESENT.   A presumptive positive result was obtained on the submitted specimen  and confirmed on repeat testing.  While 2019 novel coronavirus  (SARS-CoV-2) nucleic acids may be present in the submitted sample  additional confirmatory testing may be necessary for epidemiological  and / or clinical management purposes  to differentiate between  SARS-CoV-2 and other Sarbecovirus currently known to infect humans.  If clinically indicated additional testing with an alternate test  methodology [phone]) is advised. The SARS-CoV-2 RNA is generally  detectable in  upper and lower respiratory sp ecimens during the acute  phase of infection. The expected result is Negative. Fact Sheet for Patients:  BoilerBrush.com.cyhttps://www.fda.gov/media/136312/download Fact Sheet for Healthcare Providers: https://pope.com/https://www.fda.gov/media/136313/download This test is not yet approved or cleared by the [place] FDA and has been authorized for detection and/or diagnosis of SARS-CoV-2 by FDA under an Emergency Use Authorization (EUA).  This EUA will remain in effect (meaning this test can be used) for the duration of the COVID-19 declaration under Section 564(b)(1) of the Act, 21 U.S.C. section 360bbb-3(b)(1), unless the authorization is terminated or revoked sooner. Performed at [facility], [address] [place], [place] [id number]    Time coordinating discharge: 32  minutes   SIGNED:  Standley Dakinslanford Samary Shatz, MD  [facility] 04/17/2019, 9:41 AM How to contact the [facility] Attending or Consulting provider 7A - 7P or covering provider during after hours 7P -7A, for this patient?  1. Check the care team in [facility] and look for a) attending/consulting [facility] provider listed and b) the [facility] team listed 2. Log into [email or web address] and use [place]'s universal password to access. If you do not have the password, please contact the hospital operator. 3. Locate the [facility] provider you are looking for under [facility] and page to a number that you can be directly reached. 4. If you still have difficulty reaching the provider, please page the [facility] (Director on Call) for the Hospitalists listed on amion for assistance.

## 2019-05-05 ENCOUNTER — Ambulatory Visit: Payer: Self-pay | Attending: Nurse Practitioner | Admitting: Nurse Practitioner

## 2019-05-05 ENCOUNTER — Other Ambulatory Visit: Payer: Self-pay

## 2019-05-05 ENCOUNTER — Encounter: Payer: Self-pay | Admitting: Nurse Practitioner

## 2019-05-05 DIAGNOSIS — R7309 Other abnormal glucose: Secondary | ICD-10-CM

## 2019-05-05 DIAGNOSIS — J441 Chronic obstructive pulmonary disease with (acute) exacerbation: Secondary | ICD-10-CM

## 2019-05-05 DIAGNOSIS — E781 Pure hyperglyceridemia: Secondary | ICD-10-CM

## 2019-05-05 DIAGNOSIS — I10 Essential (primary) hypertension: Secondary | ICD-10-CM

## 2019-05-05 DIAGNOSIS — Z09 Encounter for follow-up examination after completed treatment for conditions other than malignant neoplasm: Secondary | ICD-10-CM

## 2019-05-05 MED ORDER — AMLODIPINE BESYLATE 5 MG PO TABS: 5.0000 mg | ORAL_TABLET | Freq: Every day | ORAL | 0 refills | Status: DC

## 2019-05-05 MED ORDER — IPRATROPIUM-ALBUTEROL 0.5-2.5 (3) MG/3ML IN SOLN: 3.0000 mL | RESPIRATORY_TRACT | 0 refills | Status: DC | PRN

## 2019-05-05 MED ORDER — FLUTICASONE-SALMETEROL 250-50 MCG/DOSE IN AEPB: 1.0000 | INHALATION_SPRAY | Freq: Two times a day (BID) | RESPIRATORY_TRACT | 6 refills | Status: DC

## 2019-05-05 MED FILL — IPRAT-ALBUT 0.5-3(2.5) MG/3: 0.5-2.5 (3) | 30 days supply | Qty: 180 | Fill #0

## 2019-05-05 MED FILL — AMLODIPINE BESYLATE 5 MG TA: 5 | 30 days supply | Qty: 30 | Fill #0

## 2019-05-05 MED FILL — !ADVAIR 250/50 DISKUS: 250-50 | 30 days supply | Qty: 60 | Fill #0

## 2019-05-05 NOTE — Progress Notes (Signed)
Virtual Visit via Telephone Note Due to national recommendations of social distancing due to [place] 19, telehealth visit is felt to be most appropriate for this patient at this time.  I discussed the limitations, risks, security and privacy concerns of performing an evaluation and management service by telephone and the availability of in person appointments. I also discussed with the patient that there may be a patient responsible charge related to this service. The patient expressed understanding and agreed to proceed.    I connected with Laurelyn Sickle on 05/06/19  at   9:30 AM EDT  EDT by telephone and verified that I am speaking with the correct person using two identifiers.   Consent I discussed the limitations, risks, security and privacy concerns of performing an evaluation and management service by telephone and the availability of in person appointments. I also discussed with the patient that there may be a patient responsible charge related to this service. The patient expressed understanding and agreed to proceed.   Location of Patient: Private  Residence   Location of Provider: [place] and [employer] Office    Persons participating in Telemedicine visit: Geryl Rankins FNP-BC [place]    History of Present Illness: Telemedicine visit for: Establish care and HFU  has a past medical history of Asthma, Bronchiolitis, COPD (chronic obstructive pulmonary disease) ([place]), and Hypertension.  NO PCP in over 5 years.  States she has been out work with no insurance. Hx of childhood asthma/PNA as a child.   Per note from Dr. Melvyn Novas 03-05-2014 Never really 100% but no need maintenance rx or specialist eval then started smoking and did fine until 40's with sob/ intermttent cough started using inhalers referred 03/05/2014 to pulmonary clinic by Dr Annitta Needs  with 2nd admit to [facility] and abd ct Chest 02/24/14.   HFU She was admitted to the hospital from 7-8 through  04/17/2019.  Treated for COPD exacerbation with supplemental O2, p.o. ABX, nebulizers and p.o. steroids and IV Solu-Medrol. She states she quit smoking a few days prior to her hospital admission. However there are several reports of different quit dates for patient in her chart.  COVID negative.  She will need follow-up with pulmonology.  She was discharged home with Mucinex, azithromycin for 2 days, p.o. prednisone for 4 days, nebulizer and nebs, Advair 250/50 twice daily and albuterol rescue inhaler.  She did require supplemental O2 while she was hospitalized however was weaned off.  Started on amlodipine 5 mg for hypertension.  Previous medications including Valium and hydrocodone-acetaminophen were DC'd.   Today patient reports she is feeling much better with only mild shortness of breath and no chest pain. Sputum production almost completely resolved with mucinex. However she does have a chronic smokers cough. Denies fever or wheezing.   She will need to pick up several medications today from the community clinic pharmacy as she was not able to afford the medications that were sent to [facility] from the ER upon her discharge. Patient is uninsured and will need to apply for the financial assistance program. As she is uninsured I will have her see our Pulmonologist here at the clinic for any recommendations such as PFT/Spirometry/Sleep study etc.   Referred to be [facility] for mammogram.  No Pap required as patient had a hysterectomy in the past.  ESSENTIAL HYPERTENSION Currently taking amlodipine 5 mg daily as prescribed.  She does monitor her blood pressure at home and reports bp has been running "pretty good". Most  recent reading 128/74. Denies chest pain, visual disturbances, palpitations, lightheadedness, dizziness, headaches or BLE edema.  BP Readings from Last 3 Encounters:  04/17/19 138/80  02/24/19 (!) 148/85  10/11/18 (!) 167/125     Past Medical History:  Diagnosis Date  . Asthma   .  Bronchiolitis   . COPD (chronic obstructive pulmonary disease) (HCC)   . Hypertension     Past Surgical History:  Procedure Laterality Date  . ABDOMINAL HYSTERECTOMY    . APPENDECTOMY    . BACK SURGERY      Family History  Problem Relation Age of Onset  . CAD Father   . Heart disease Father   . Colon cancer Mother   . Breast cancer Sister   . Emphysema Paternal Grandfather        never smoker, Theatre stage managerfire fighter    Social History   Socioeconomic History  . Marital status: Divorced    Spouse name: Not on file  . Number of children: Not on file  . Years of education: Not on file  . Highest education level: Not on file  Occupational History  . Occupation: Unemployed  Social Needs  . Financial resource strain: Not on file  . Food insecurity    Worry: Not on file    Inability: Not on file  . Transportation needs    Medical: Not on file    Non-medical: Not on file  Tobacco Use  . Smoking status: Former Smoker    Packs/day: 1.00    Years: 30.00    Pack years: 30.00    Types: Cigarettes    Quit date: 12/30/2013    Years since quitting: 5.3  . Smokeless tobacco: Never Used  Substance and Sexual Activity  . Alcohol use: Yes    Comment: 3-4 times per wk  . Drug use: No  . Sexual activity: Not Currently  Lifestyle  . Physical activity    Days per week: Not on file    Minutes per session: Not on file  . Stress: Not on file  Relationships  . Social Musicianconnections    Talks on phone: Not on file    Gets together: Not on file    Attends religious service: Not on file    Active member of club or organization: Not on file    Attends meetings of clubs or organizations: Not on file    Relationship status: Not on file  Other Topics Concern  . 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  HENT: Negative.  Negative for nosebleeds.   Eyes: Negative.  Negative for blurred vision, double vision and photophobia.  Respiratory: Positive for cough (improved with mucinex) and shortness of breath (minimal with exertion. ). Negative for wheezing.   Cardiovascular: Negative.  Negative for chest pain, palpitations and leg swelling.  Gastrointestinal: Negative.  Negative for heartburn, nausea and vomiting.  Musculoskeletal: Negative.  Negative for myalgias.  Neurological: Negative.  Negative for dizziness, focal weakness, seizures and headaches.  Endo/Heme/Allergies: Positive for environmental allergies.  Psychiatric/Behavioral: Negative.  Negative for suicidal ideas.    Assessment and Plan: Nelva Bushorma was seen today for hospitalization  follow-up.  Diagnoses and all orders for this visit:  Hospital discharge follow-up  Chronic obstructive pulmonary disease with acute exacerbation (HCC) -     Fluticasone-Salmeterol (ADVAIR) 250-50 MCG/DOSE AEPB; Inhale 1 puff into the lungs every 12 (twelve) hours. -     ipratropium-albuterol (DUONEB) 0.5-2.5 (3) MG/3ML SOLN; Take 3 mLs by nebulization every 4 (four) hours as needed. -     Ambulatory referral to Pulmonology  Elevated glucose -     Hemoglobin A1c; Future  Hypertriglyceridemia -     Lipid panel; Future Lab Results  Component Value Date   CHOL 260 (H) 02/19/2014   Lab Results  Component Value Date   HDL 58 02/19/2014   Lab Results  Component Value Date   LDLCALC NOT CALC 02/19/2014   Lab Results  Component Value Date   TRIG 503 (H) 02/19/2014   Lab Results  Component Value Date   CHOLHDL 4.5 02/19/2014   Will likely need statin. Awaiting fasting lipid panel.    Essential hypertension -     amLODipine (NORVASC) 5 MG tablet; Take 1 tablet (5 mg total) by mouth  daily. Continue all antihypertensives as prescribed.  Remember to bring in your blood pressure log with you for your follow up appointment.  DASH/Mediterranean Diets are healthier choices for HTN.       Follow Up Instructions Return in about 2 months (around 07/06/2019).     I discussed the assessment and treatment plan with the patient. The patient was provided an opportunity to ask questions and all were answered. The patient agreed with the plan and demonstrated an understanding of the instructions.   The patient was advised to call back or seek an in-person evaluation if the symptoms worsen or if the condition fails to improve as anticipated.  I provided 29 minutes of non-face-to-face time during this encounter including median intraservice time, reviewing previous notes, labs, imaging, medications and explaining diagnosis and management.  Claiborne RiggZelda W Derek Huneycutt, FNP-BC

## 2019-05-06 ENCOUNTER — Encounter: Payer: Self-pay | Admitting: Nurse Practitioner

## 2019-05-08 ENCOUNTER — Ambulatory Visit: Payer: Self-pay | Attending: Nurse Practitioner

## 2019-05-08 ENCOUNTER — Other Ambulatory Visit: Payer: Self-pay

## 2019-05-08 DIAGNOSIS — E781 Pure hyperglyceridemia: Secondary | ICD-10-CM

## 2019-05-08 DIAGNOSIS — R7309 Other abnormal glucose: Secondary | ICD-10-CM

## 2019-05-09 ENCOUNTER — Other Ambulatory Visit: Payer: Self-pay | Admitting: Nurse Practitioner

## 2019-05-09 LAB — LIPID PANEL
Chol/HDL Ratio: 4.2 ratio (ref 0.0–4.4)
Cholesterol, Total: 254 mg/dL — ABNORMAL HIGH (ref 100–199)
HDL: 61 mg/dL (ref >39)
LDL Calculated: 129 mg/dL — ABNORMAL HIGH (ref 0–99)
Triglycerides: 321 mg/dL — ABNORMAL HIGH (ref 0–149)
VLDL Cholesterol Cal: 64 mg/dL — ABNORMAL HIGH (ref 5–40)

## 2019-05-09 LAB — HEMOGLOBIN A1C
Est. average glucose Bld gHb Est-mCnc: 117 mg/dL
Hgb A1c MFr Bld: 5.7 % — ABNORMAL HIGH (ref 4.8–5.6)

## 2019-05-09 MED ORDER — ATORVASTATIN CALCIUM 20 MG PO TABS: 20.0000 mg | ORAL_TABLET | Freq: Every day | ORAL | 3 refills | Status: DC

## 2019-05-11 MED FILL — ATORVASTATIN 20 MG TABLET: 20 | 30 days supply | Qty: 30 | Fill #0

## 2019-05-15 ENCOUNTER — Telehealth (HOSPITAL_COMMUNITY): Payer: Self-pay

## 2019-05-15 NOTE — Telephone Encounter (Signed)
Telephoned patient to schedule with [facility]. Left a message with the person answering the phone. She stated patient will call back.

## 2019-06-01 NOTE — Progress Notes (Deleted)
   Subjective:    Patient ID: Angel [name], female    DOB: [birth year], 63 y.o.   MRN: [id number]  62 y.o.F with asthma COPD referred for evaluation.  Wants Tdap/Flu vaccine  Shortness of Breath      Review of Systems  Respiratory: Positive for shortness of breath.        Objective:   Physical Exam        Assessment & Plan:

## 2019-06-02 ENCOUNTER — Ambulatory Visit: Payer: Self-pay | Admitting: Critical Care Medicine

## 2019-09-28 ENCOUNTER — Ambulatory Visit: Payer: HRSA Program | Attending: Internal Medicine

## 2019-09-28 DIAGNOSIS — Z20828 Contact with and (suspected) exposure to other viral communicable diseases: Secondary | ICD-10-CM | POA: Diagnosis present

## 2019-09-28 DIAGNOSIS — Z20822 Contact with and (suspected) exposure to covid-19: Secondary | ICD-10-CM

## 2019-09-29 LAB — NOVEL CORONAVIRUS, NAA: SARS-CoV-2, NAA: NOT DETECTED

## 2019-10-21 ENCOUNTER — Ambulatory Visit: Payer: Self-pay | Attending: Internal Medicine

## 2019-10-21 DIAGNOSIS — Z20822 Contact with and (suspected) exposure to covid-19: Secondary | ICD-10-CM

## 2019-10-23 LAB — NOVEL CORONAVIRUS, NAA: SARS-CoV-2, NAA: DETECTED — AB

## 2019-10-25 ENCOUNTER — Encounter: Payer: Self-pay | Admitting: Unknown Physician Specialty

## 2019-10-25 ENCOUNTER — Telehealth: Payer: Self-pay | Admitting: Unknown Physician Specialty

## 2019-10-25 NOTE — Telephone Encounter (Signed)
Called to discuss with patient about Covid symptoms and the use of bamlanivimab, a monoclonal antibody infusion for those with mild to moderate Covid symptoms and at a high risk of hospitalization.  Pt is qualified for this infusion at the [facility] center due to Age >55 and COPD   Message left to call back  

## 2019-10-30 ENCOUNTER — Ambulatory Visit: Payer: Self-pay | Attending: Internal Medicine

## 2019-10-30 DIAGNOSIS — Z20822 Contact with and (suspected) exposure to covid-19: Secondary | ICD-10-CM | POA: Insufficient documentation

## 2019-10-31 LAB — NOVEL CORONAVIRUS, NAA: SARS-CoV-2, NAA: NOT DETECTED

## 2019-12-28 ENCOUNTER — Other Ambulatory Visit: Payer: Self-pay

## 2019-12-28 ENCOUNTER — Encounter (HOSPITAL_COMMUNITY): Payer: Self-pay | Admitting: Emergency Medicine

## 2019-12-28 ENCOUNTER — Emergency Department (HOSPITAL_COMMUNITY)
Admission: EM | Admit: 2019-12-28 | Discharge: 2019-12-28 | Disposition: A | Discharge status: Home / Self Care | Payer: Self-pay | Attending: Emergency Medicine | Admitting: Emergency Medicine

## 2019-12-28 DIAGNOSIS — R1032 Left lower quadrant pain: Secondary | ICD-10-CM | POA: Insufficient documentation

## 2019-12-28 DIAGNOSIS — Z5321 Procedure and treatment not carried out due to patient leaving prior to being seen by health care provider: Secondary | ICD-10-CM | POA: Insufficient documentation

## 2019-12-28 DIAGNOSIS — R0602 Shortness of breath: Secondary | ICD-10-CM | POA: Insufficient documentation

## 2019-12-28 DIAGNOSIS — R1031 Right lower quadrant pain: Secondary | ICD-10-CM | POA: Insufficient documentation

## 2019-12-28 NOTE — ED Triage Notes (Signed)
Pt c/o right and left groin pain that radiates down both legs. Reports pain began 3 days ago and progressively became worse. Pt describes the pain as severe, burning, and constant. Pain worsens with exertion and pt states it is causing shob. Pt has been taking aleve with no relief.

## 2019-12-28 NOTE — ED Notes (Signed)
Per registration, patient reports she is leaving. ?

## 2020-01-18 ENCOUNTER — Emergency Department (HOSPITAL_COMMUNITY): Payer: Self-pay

## 2020-01-18 ENCOUNTER — Other Ambulatory Visit: Payer: Self-pay

## 2020-01-18 ENCOUNTER — Emergency Department (HOSPITAL_COMMUNITY)
Admission: EM | Admit: 2020-01-18 | Discharge: 2020-01-18 | Disposition: A | Discharge status: Home / Self Care | Payer: Self-pay | Attending: Emergency Medicine | Admitting: Emergency Medicine

## 2020-01-18 ENCOUNTER — Encounter (HOSPITAL_COMMUNITY): Payer: Self-pay | Admitting: Emergency Medicine

## 2020-01-18 DIAGNOSIS — Z79899 Other long term (current) drug therapy: Secondary | ICD-10-CM | POA: Insufficient documentation

## 2020-01-18 DIAGNOSIS — J45909 Unspecified asthma, uncomplicated: Secondary | ICD-10-CM | POA: Insufficient documentation

## 2020-01-18 DIAGNOSIS — J449 Chronic obstructive pulmonary disease, unspecified: Secondary | ICD-10-CM | POA: Insufficient documentation

## 2020-01-18 DIAGNOSIS — Z87891 Personal history of nicotine dependence: Secondary | ICD-10-CM | POA: Insufficient documentation

## 2020-01-18 DIAGNOSIS — I10 Essential (primary) hypertension: Secondary | ICD-10-CM | POA: Insufficient documentation

## 2020-01-18 DIAGNOSIS — M25551 Pain in right hip: Secondary | ICD-10-CM | POA: Insufficient documentation

## 2020-01-18 MED ORDER — CYCLOBENZAPRINE HCL 10 MG PO TABS: 10.0000 mg | ORAL_TABLET | Freq: Two times a day (BID) | ORAL | 0 refills | Status: AC | PRN

## 2020-01-18 MED ORDER — KETOROLAC TROMETHAMINE 60 MG/2ML IM SOLN
60.0000 mg | Freq: Once | INTRAMUSCULAR | Status: AC
  Administered 2020-01-18: 60 mg via INTRAMUSCULAR
  Filled 2020-01-18: qty 2

## 2020-01-18 MED ORDER — OXYCODONE-ACETAMINOPHEN 5-325 MG PO TABS
1.0000 | ORAL_TABLET | ORAL | Status: DC | PRN
  Administered 2020-01-18: 12:00:00 1 via ORAL
  Filled 2020-01-18: qty 1

## 2020-01-18 MED ORDER — PREDNISONE 10 MG PO TABS: 40.0000 mg | ORAL_TABLET | Freq: Every day | ORAL | 0 refills | Status: AC

## 2020-01-18 NOTE — ED Triage Notes (Signed)
Onset 3 weeks ago developed bilateral hip and groin pain. States now only has right hip and right groin constant worsening with movement.

## 2020-01-18 NOTE — ED Notes (Signed)
Patient verbalizes understanding of discharge instructions. Opportunity for questioning and answers were provided. Armband removed by staff, pt discharged from ED.  

## 2020-01-18 NOTE — ED Provider Notes (Signed)
[facility] EMERGENCY DEPARTMENT Provider Note   CSN: [id number] Arrival date & time: 01/18/20  1109     History Chief Complaint  Patient presents with  . Hip Pain    Angel [name] is a 64 y.o. female.  Patient is a 64 year old female with past medical history of COPD, hypertension presenting to the emergency department for hip pain for several weeks.  Patient reports that the pain is worse with movement and her joints feel stiff.  It is worse in the right hip.  Denies any numbness, tingling, weakness, saddle anesthesia, loss of control of bowel or bladder.  She has tried Aleve over-the-counter and reports minimal to no relief.  Has not seen primary care for this.        Past Medical History:  Diagnosis Date  . Asthma   . Bronchiolitis   . COPD (chronic obstructive pulmonary disease) (HCC)   . Hypertension     Patient Active Problem List   Diagnosis Date Noted  . Chest pain 04/15/2019  . Bronchiectasis without acute exacerbation (HCC) 03/06/2014  . Pneumonia 02/19/2014  . Elevated BP 02/19/2014  . COPD (chronic obstructive pulmonary disease) (HCC) 02/19/2014  . Environmental allergies 02/19/2014  . COPD exacerbation (HCC) 01/21/2014  . HCAP (healthcare-associated pneumonia) 01/21/2014  . Smoker 12/31/2013  . Acute respiratory failure with hypoxia (HCC) 12/31/2013  . Asthma exacerbation 12/30/2013  . Asthma exacerbation in COPD (HCC) 12/30/2013    Past Surgical History:  Procedure Laterality Date  . ABDOMINAL HYSTERECTOMY    . APPENDECTOMY    . BACK SURGERY       OB History   No obstetric history on file.     Family History  Problem Relation Age of Onset  . CAD Father   . Heart disease Father   . Colon cancer Mother   . Breast cancer Sister   . Emphysema Paternal Grandfather        never smoker, Theatre stage manager    Social History   Tobacco Use  . Smoking status: Former Smoker    Packs/day: 1.00    Years: 30.00    Pack years:  30.00    Types: Cigarettes    Quit date: 12/30/2013    Years since quitting: 6.0  . Smokeless tobacco: Never Used  Substance Use Topics  . Alcohol use: Yes    Comment: 3-4 times per wk  . Drug use: No    Home Medications Prior to Admission medications   Medication Sig Start Date End Date Taking? Authorizing Provider  albuterol (VENTOLIN HFA) 108 (90 Base) MCG/ACT inhaler Inhale 2 puffs into the lungs every 6 (six) hours as needed for wheezing or shortness of breath. 04/17/19   Johnson, Clanford L, MD  amLODipine (NORVASC) 5 MG tablet Take 1 tablet (5 mg total) by mouth daily. 05/05/19 08/03/19  Claiborne Rigg, NP  atorvastatin (LIPITOR) 20 MG tablet Take 1 tablet (20 mg total) by mouth daily. 05/09/19   Claiborne Rigg, NP  cyclobenzaprine (FLEXERIL) 10 MG tablet Take 1 tablet (10 mg total) by mouth 2 (two) times daily as needed for up to 7 days for muscle spasms. 01/18/20 01/25/20  Arlyn Dunning, PA-C  Fluticasone-Salmeterol (ADVAIR) 250-50 MCG/DOSE AEPB Inhale 1 puff into the lungs every 12 (twelve) hours. 05/05/19   Claiborne Rigg, NP  ipratropium-albuterol (DUONEB) 0.5-2.5 (3) MG/3ML SOLN Take 3 mLs by nebulization every 4 (four) hours as needed. 05/05/19   Claiborne Rigg, NP  predniSONE (DELTASONE)  10 MG tablet Take 4 tablets (40 mg total) by mouth daily for 5 days. 01/18/20 01/23/20  Arlyn Dunning, PA-C    Allergies    Penicillins  Review of Systems   Review of Systems  Constitutional: Negative for chills and fever.  Respiratory: Negative for shortness of breath.   Cardiovascular: Negative for leg swelling.  Gastrointestinal: Negative for nausea and vomiting.  Genitourinary: Negative for difficulty urinating and dysuria.  Musculoskeletal: Positive for arthralgias.  Skin: Negative for rash.  Neurological: Negative for syncope, weakness and numbness.  Hematological: Does not bruise/bleed easily.    Physical Exam Updated Vital Signs BP (!) 160/90 (BP Location: Left Arm)    Pulse 84   Temp 97.8 F (36.6 C) (Oral)   Resp 18   Ht 5\' 7"  (1.702 m)   Wt 65.8 kg   SpO2 100%   BMI 22.71 kg/m   Physical Exam Vitals and nursing note reviewed.  Constitutional:      Appearance: Normal appearance.  HENT:     Head: Normocephalic.  Eyes:     Conjunctiva/sclera: Conjunctivae normal.  Pulmonary:     Effort: Pulmonary effort is normal.  Musculoskeletal:     Right hip: Tenderness present. No bony tenderness or crepitus. Decreased range of motion. Normal strength.     Left hip: Normal.     Right lower leg: No deformity or lacerations. No edema.     Left lower leg: No deformity or lacerations. No edema.     Comments: There is reproducible pain with abduction and external rotation of the right hip.  She has normal distal strength, sensation, pulses.  Normal skin and no swelling of the legs.  Skin:    General: Skin is dry.     Capillary Refill: Capillary refill takes less than 2 seconds.     Findings: No bruising, erythema or rash.  Neurological:     Mental Status: She is alert.  Psychiatric:        Mood and Affect: Mood normal.     ED Results / Procedures / Treatments   Labs (all labs ordered are listed, but only abnormal results are displayed) Labs Reviewed - No data to display  EKG None  Radiology DG Hip Unilat  With Pelvis 2-3 Views Right  Result Date: 01/18/2020 CLINICAL DATA:  Right hip pain EXAM: DG HIP (WITH OR WITHOUT PELVIS) 2-3V RIGHT COMPARISON:  None. FINDINGS: Alignment is anatomic. There is no acute fracture. Minor degenerative changes are present. No substantial joint space narrowing. No intrinsic osseous lesion. Vascular calcification is noted. IMPRESSION: Minor degenerative changes at the right hip. Electronically Signed   By: 03/19/2020 M.D.   On: 01/18/2020 12:31    Procedures Procedures (including critical care time)  Medications Ordered in ED Medications  oxyCODONE-acetaminophen (PERCOCET/ROXICET) 5-325 MG per tablet 1 tablet  (1 tablet Oral Given 01/18/20 1204)  ketorolac (TORADOL) injection 60 mg (has no administration in time range)    ED Course  I have reviewed the triage vital signs and the nursing notes.  Pertinent labs & imaging results that were available during my care of the patient were reviewed by me and considered in my medical decision making (see chart for details).  Clinical Course as of Jan 18 1643  Mon Jan 18, 2020  1642 Patient with atraumatic hip pain for several weeks.  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  X-ray showing mild arthritis.  Suspect this is the cause of her pain.  Given a shot of Toradol and  oxycodone.  We will give her a prescription for prednisone and Flexeril and referral to orthopedic   [KM]    Clinical Course User Index [KM] Kristine Royal   MDM Rules/Calculators/A&P                      Based on review of vitals, medical screening exam, lab work and/or imaging, there does not appear to be an acute, emergent etiology for the patient's symptoms. Counseled pt on good return precautions and encouraged both PCP and ED follow-up as needed.  Prior to discharge, I also discussed incidental imaging findings with patient in detail and advised appropriate, recommended follow-up in detail.  Clinical Impression: 1. Right hip pain     Disposition: Discharge  Prior to providing a prescription for a controlled substance, I independently reviewed the patient's recent prescription history on the [place]. The patient had no recent or regular prescriptions and was deemed appropriate for a brief, less than 3 day prescription of narcotic for acute analgesia.  This note was prepared with assistance of Systems analyst. Occasional wrong-word or sound-a-like substitutions may have occurred due to the inherent limitations of voice recognition software.  Final Clinical Impression(s) / ED Diagnoses Final diagnoses:  Right hip pain     Rx / DC Orders ED Discharge Orders         Ordered    predniSONE (DELTASONE) 10 MG tablet  Daily     01/18/20 1643    cyclobenzaprine (FLEXERIL) 10 MG tablet  2 times daily PRN     01/18/20 1643           Kristine Royal 01/18/20 1644    Lucrezia Starch, MD 01/20/20 1301

## 2020-01-18 NOTE — Discharge Instructions (Signed)
Thank you for allowing me to care for you today. Please return to the emergency department if you have new or worsening symptoms. Take your medications as instructed.  ° °

## 2020-01-19 MED FILL — predniSONE 10 MG TABS: 10 | 5 days supply | Qty: 20 | Fill #0

## 2020-01-19 MED FILL — CYCLOBENZAPRINE 10 MG TAB: 10 | 7 days supply | Qty: 14 | Fill #0

## 2020-06-12 ENCOUNTER — Emergency Department (HOSPITAL_COMMUNITY): Admission: EM | Admit: 2020-06-12 | Discharge: 2020-06-13 | Discharge status: Against Medical Advice | Payer: Self-pay

## 2020-06-12 ENCOUNTER — Other Ambulatory Visit: Payer: Self-pay

## 2020-06-12 NOTE — ED Notes (Signed)
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

## 2022-04-04 ENCOUNTER — Emergency Department (HOSPITAL_COMMUNITY): Payer: Self-pay

## 2022-04-04 ENCOUNTER — Emergency Department (HOSPITAL_COMMUNITY)
Admission: EM | Admit: 2022-04-04 | Discharge: 2022-04-04 | Disposition: A | Discharge status: Home / Self Care | Payer: Self-pay | Attending: Emergency Medicine | Admitting: Emergency Medicine

## 2022-04-04 ENCOUNTER — Other Ambulatory Visit: Payer: Self-pay

## 2022-04-04 ENCOUNTER — Encounter (HOSPITAL_COMMUNITY): Payer: Self-pay

## 2022-04-04 DIAGNOSIS — K2920 Alcoholic gastritis without bleeding: Secondary | ICD-10-CM | POA: Insufficient documentation

## 2022-04-04 DIAGNOSIS — Z7951 Long term (current) use of inhaled steroids: Secondary | ICD-10-CM | POA: Insufficient documentation

## 2022-04-04 DIAGNOSIS — I10 Essential (primary) hypertension: Secondary | ICD-10-CM | POA: Insufficient documentation

## 2022-04-04 DIAGNOSIS — J449 Chronic obstructive pulmonary disease, unspecified: Secondary | ICD-10-CM | POA: Insufficient documentation

## 2022-04-04 DIAGNOSIS — R188 Other ascites: Secondary | ICD-10-CM

## 2022-04-04 DIAGNOSIS — R7401 Elevation of levels of liver transaminase levels: Secondary | ICD-10-CM | POA: Insufficient documentation

## 2022-04-04 DIAGNOSIS — K7031 Alcoholic cirrhosis of liver with ascites: Secondary | ICD-10-CM | POA: Insufficient documentation

## 2022-04-04 DIAGNOSIS — Z79899 Other long term (current) drug therapy: Secondary | ICD-10-CM | POA: Insufficient documentation

## 2022-04-04 LAB — COMPREHENSIVE METABOLIC PANEL
ALT: 437 U/L — ABNORMAL HIGH (ref 0–44)
AST: 646 U/L — ABNORMAL HIGH (ref 15–41)
Albumin: 2.4 g/dL — ABNORMAL LOW (ref 3.5–5.0)
Alkaline Phosphatase: 228 U/L — ABNORMAL HIGH (ref 38–126)
Anion gap: 3 — ABNORMAL LOW (ref 5–15)
BUN: 15 mg/dL (ref 8–23)
CO2: 25 mmol/L (ref 22–32)
Calcium: 8.3 mg/dL — ABNORMAL LOW (ref 8.9–10.3)
Chloride: 110 mmol/L (ref 98–111)
Creatinine, Ser: 0.7 mg/dL (ref 0.44–1.00)
GFR, Estimated: >60 mL/min (ref >60)
Glucose, Bld: 95 mg/dL (ref 70–99)
Potassium: 3.7 mmol/L (ref 3.5–5.1)
Sodium: 138 mmol/L (ref 135–145)
Total Bilirubin: 1.4 mg/dL — ABNORMAL HIGH (ref 0.3–1.2)
Total Protein: 8.6 g/dL — ABNORMAL HIGH (ref 6.5–8.1)

## 2022-04-04 LAB — URINALYSIS, ROUTINE W REFLEX MICROSCOPIC
Bilirubin Urine: NEGATIVE
Glucose, UA: NEGATIVE mg/dL
Hgb urine dipstick: NEGATIVE
Ketones, ur: 5 mg/dL — AB
Nitrite: NEGATIVE
Protein, ur: NEGATIVE mg/dL
Specific Gravity, Urine: 1.026 (ref 1.005–1.030)
pH: 5 (ref 5.0–8.0)

## 2022-04-04 LAB — CBC WITH DIFFERENTIAL/PLATELET
Abs Immature Granulocytes: 0.02 10*3/uL (ref 0.00–0.07)
Basophils Absolute: 0 10*3/uL (ref 0.0–0.1)
Basophils Relative: 1 %
Eosinophils Absolute: 0.1 10*3/uL (ref 0.0–0.5)
Eosinophils Relative: 2 %
HCT: 43.4 % (ref 36.0–46.0)
Hemoglobin: 14.6 g/dL (ref 12.0–15.0)
Immature Granulocytes: 0 %
Lymphocytes Relative: 28 %
Lymphs Abs: 1.8 10*3/uL (ref 0.7–4.0)
MCH: 31.2 pg (ref 26.0–34.0)
MCHC: 33.6 g/dL (ref 30.0–36.0)
MCV: 92.7 fL (ref 80.0–100.0)
Monocytes Absolute: 0.8 10*3/uL (ref 0.1–1.0)
Monocytes Relative: 13 %
Neutro Abs: 3.7 10*3/uL (ref 1.7–7.7)
Neutrophils Relative %: 56 %
Platelets: 169 10*3/uL (ref 150–400)
RBC: 4.68 MIL/uL (ref 3.87–5.11)
RDW: 16.3 % — ABNORMAL HIGH (ref 11.5–15.5)
WBC: 6.5 10*3/uL (ref 4.0–10.5)
nRBC: 0 % (ref 0.0–0.2)

## 2022-04-04 LAB — MAGNESIUM: Magnesium: 1.8 mg/dL (ref 1.7–2.4)

## 2022-04-04 LAB — LIPASE, BLOOD: Lipase: 75 U/L — ABNORMAL HIGH (ref 11–51)

## 2022-04-04 MED ORDER — IOHEXOL 300 MG/ML  SOLN
100.0000 mL | Freq: Once | INTRAMUSCULAR | Status: AC | PRN
  Administered 2022-04-04: 100 mL via INTRAVENOUS

## 2022-04-04 MED ORDER — LACTATED RINGERS IV BOLUS
1000.0000 mL | Freq: Once | INTRAVENOUS | Status: AC
  Administered 2022-04-04: 1000 mL via INTRAVENOUS

## 2022-04-04 MED ORDER — ONDANSETRON HCL 4 MG/2ML IJ SOLN
4.0000 mg | Freq: Once | INTRAMUSCULAR | Status: AC
  Administered 2022-04-04: 4 mg via INTRAVENOUS
  Filled 2022-04-04: qty 2

## 2022-04-04 MED ORDER — PANTOPRAZOLE SODIUM 40 MG IV SOLR
40.0000 mg | Freq: Once | INTRAVENOUS | Status: AC
  Administered 2022-04-04: 40 mg via INTRAVENOUS
  Filled 2022-04-04: qty 10

## 2022-04-04 MED ORDER — OMEPRAZOLE 20 MG PO CPDR: 20.0000 mg | DELAYED_RELEASE_CAPSULE | Freq: Every day | ORAL | 0 refills | Status: DC

## 2022-04-04 MED ORDER — MORPHINE SULFATE (PF) 4 MG/ML IV SOLN
4.0000 mg | Freq: Once | INTRAVENOUS | Status: AC
  Administered 2022-04-04: 4 mg via INTRAVENOUS
  Filled 2022-04-04: qty 1

## 2022-04-04 NOTE — ED Triage Notes (Signed)
Pt c/o ongoing LUQ abdominal pain for one month. Pt reports on average four alcoholic drinks per night. Pt endorses nausea without vomiting or diarrhea.

## 2022-04-04 NOTE — ED Provider Triage Note (Signed)
Emergency Medicine Provider Triage Evaluation Note  Angel [name] , a 66 y.o. female  was evaluated in triage.  For quadrant abdominal pain.  Is been going on for 1 week and has been gradually worsening.  She says the pain wax and wanes in intensity and she sometimes has really intense 10 out of 10 pains.  She feels like somebody is punching her.  He radiates to the left flank.  Denies any recent trauma. Endorses dark urine. NO other urinary sx. No hx of kidney stones.  She does drink 4 alcoholic drinks per night.  No nausea or vomiting.  No diarrhea or constipation.  No fevers or chills.    Review of Systems  Positive:  Negative:   Physical Exam  BP (!) 140/92 (BP Location: Right Arm)   Pulse 83   Temp 98.2 F (36.8 C) (Oral)   Resp 18   Ht 5\' 7"  (1.702 m)   Wt 62.6 kg   SpO2 100%   BMI 21.61 kg/m  Gen:   Awake, no distress   Resp:  Normal effort  MSK:   Moves extremities without difficulty  Other:  LUQ and L CVA tenderness on exam  Medical Decision Making  Medically screening exam initiated at 2:04 PM.  Appropriate orders placed.  Angel [name] was informed that the remainder of the evaluation will be completed by another provider, this initial triage assessment does not replace that evaluation, and the importance of remaining in the ED until their evaluation is complete.     Luisa Hart, PA-C 04/04/22 1409

## 2022-04-04 NOTE — ED Provider Notes (Signed)
[facility] DEPT Provider Note   CSN: [id number] Arrival date & time: 04/04/22  1325     History {Add pertinent medical, surgical, social history, OB history to HPI:1} Chief Complaint  Patient presents with   Abdominal Pain    Angel [name] is a 66 y.o. female.  Patient is a 66 year old female with a history of COPD, hypertension who is presenting today with complaints of abdominal pain, poor appetite who is presenting to the emergency room today due to worsening symptoms over the last week.  Patient reports that she is not having any vomiting but the pain will come in waves and sometimes is so severe it doubles her over.  She reports that most of the pain is on the left side but she also feels it on the right and her abdomen feels hard.  She has had a poor appetite for quite some time but does not feel that eating seems to make the pain a whole lot worse.  She has not had any known fever new cough or sputum production.  She has not had any swelling in her legs and denies any urinary symptoms.  Patient reports that she typically drinks 4 glasses of wine multiple days a week but does not drink every week and reports that she drinks less now than she used to when she was younger.  She is still using tobacco daily.  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  Because the pain has not been improving in the last week she came for further evaluation.  She takes no Tylenol.  Does not have a PCP at this time and uses her inhaler as needed.  No known history of liver disease and prior history of appendectomy and abdominal hysterectomy.  The history is provided by the patient.  Abdominal Pain      Home Medications Prior to Admission medications   Medication Sig Start Date End Date Taking? Authorizing Provider  albuterol (VENTOLIN HFA) 108 (90 Base) MCG/ACT inhaler  Inhale 2 puffs into the lungs every 6 (six) hours as needed for wheezing or shortness of breath. 04/17/19   Johnson, Clanford L, MD  amLODipine (NORVASC) 5 MG tablet Take 1 tablet (5 mg total) by mouth daily. 05/05/19 08/03/19  Gildardo Pounds, NP  atorvastatin (LIPITOR) 20 MG tablet Take 1 tablet (20 mg total) by mouth daily. 05/09/19   Gildardo Pounds, NP  Fluticasone-Salmeterol (ADVAIR) 250-50 MCG/DOSE AEPB Inhale 1 puff into the lungs every 12 (twelve) hours. 05/05/19   Gildardo Pounds, NP  ipratropium-albuterol (DUONEB) 0.5-2.5 (3) MG/3ML SOLN Take 3 mLs by nebulization every 4 (four) hours as needed. 05/05/19   Gildardo Pounds, NP      Allergies    Penicillins    Review of Systems   Review of Systems  Gastrointestinal:  Positive for abdominal pain.    Physical Exam Updated Vital Signs BP 138/89   Pulse 78   Temp 98.1 F (36.7 C) (Oral)   Resp 16   Ht '5\' 7"'  (1.702 m)   Wt 62.6 kg   SpO2 96%   BMI 21.61 kg/m  Physical Exam Vitals and nursing note reviewed.  Constitutional:      General: She is not in acute distress.    Appearance: She is well-developed.  HENT:     Head: Normocephalic and atraumatic.  Eyes:  Pupils: Pupils are equal, round, and reactive to light.  Cardiovascular:     Rate and Rhythm: Normal rate and regular rhythm.     Heart sounds: Normal heart sounds. No murmur heard.    No friction rub.  Pulmonary:     Effort: Pulmonary effort is normal.     Breath sounds: Normal breath sounds. No wheezing or rales.  Abdominal:     General: Bowel sounds are normal. There is no distension.     Palpations: Abdomen is soft.     Tenderness: There is abdominal tenderness in the right upper quadrant, periumbilical area, suprapubic area and left upper quadrant. There is guarding. There is no rebound.  Musculoskeletal:        General: No tenderness. Normal range of motion.     Right lower leg: No edema.     Left lower leg: No edema.     Comments: No edema  Skin:     General: Skin is warm and dry.     Findings: No rash.  Neurological:     Mental Status: She is alert and oriented to person, place, and time. Mental status is at baseline.     Cranial Nerves: No cranial nerve deficit.  Psychiatric:        Mood and Affect: Mood normal.        Behavior: Behavior normal.     ED Results / Procedures / Treatments   Labs (all labs ordered are listed, but only abnormal results are displayed) Labs Reviewed  CBC WITH DIFFERENTIAL/PLATELET - Abnormal; Notable for the following components:      Result Value   RDW 16.3 (*)    All other components within normal limits  COMPREHENSIVE METABOLIC PANEL - Abnormal; Notable for the following components:   Calcium 8.3 (*)    Total Protein 8.6 (*)    Albumin 2.4 (*)    AST 646 (*)    ALT 437 (*)    Alkaline Phosphatase 228 (*)    Total Bilirubin 1.4 (*)    Anion gap 3 (*)    All other components within normal limits  LIPASE, BLOOD - Abnormal; Notable for the following components:   Lipase 75 (*)    All other components within normal limits  URINALYSIS, ROUTINE W REFLEX MICROSCOPIC - Abnormal; Notable for the following components:   Color, Urine AMBER (*)    APPearance HAZY (*)    Ketones, ur 5 (*)    Leukocytes,Ua SMALL (*)    Bacteria, UA RARE (*)    All other components within normal limits  MAGNESIUM    EKG None  Radiology US Abdomen Limited RUQ (LIVER/GB)  Result Date: 04/04/2022 CLINICAL DATA:  Right upper quadrant pain EXAM: ULTRASOUND ABDOMEN LIMITED RIGHT UPPER QUADRANT COMPARISON:  CT today FINDINGS: Gallbladder: Small layering gravel stones measuring up to 6 mm. 2 mm gallbladder wall polyp along the anterior gallbladder wall. Mild wall thickening. Sludge seen within the gallbladder. No sonographic Murphy sign. Common bile duct: Diameter: Normal caliber, 3 mm. Liver: Heterogeneous echotexture and nodular contours compatible with cirrhosis. No focal hepatic abnormality. Portal vein is patent on  color Doppler imaging with normal direction of blood flow towards the liver. Other: Small amount of perihepatic ascites. IMPRESSION: Changes of cirrhosis.  Perihepatic ascites. Few small layering gravel stones. Mild gallbladder wall thickening. No sonographic Murphy sign. Wall thickening most likely related to liver disease, less likely cholecystitis. Electronically Signed   By: Rolm Baptise M.D.   On: 04/04/2022 17:42  CT Abdomen Pelvis W Contrast  Result Date: 04/04/2022 CLINICAL DATA:  LEFT upper quadrant, LEFT flank, and LEFT lower quadrant pain for 1 month, nausea without vomiting, reports an average of 4 alcoholic drinks per night. History hypertension, asthma, former smoker, COPD EXAM: CT ABDOMEN AND PELVIS WITH CONTRAST TECHNIQUE: Multidetector CT imaging of the abdomen and pelvis was performed using the standard protocol following bolus administration of intravenous contrast. RADIATION DOSE REDUCTION: This exam was performed according to the departmental dose-optimization program which includes automated exposure control, adjustment of the mA and/or kV according to patient size and/or use of iterative reconstruction technique. CONTRAST:  123m OMNIPAQUE IOHEXOL 300 MG/ML  SOLN IV COMPARISON:  None FINDINGS: Lower chest: Lung bases clear Hepatobiliary: Nodular cirrhotic liver without focal mass. Thickened gallbladder wall. No biliary dilatation Pancreas: Normal appearance Spleen: Normal size and appearance Adrenals/Urinary Tract: Adrenal glands normal appearance. Kidneys, ureters, and bladder normal appearance Stomach/Bowel: Appendix surgically absent by history. Stomach and bowel loops normal appearance Vascular/Lymphatic: Atherosclerotic calcifications aorta, iliac arteries, visceral arteries. Aorta normal caliber. Small collaterals noted in LEFT upper quadrant anterior abdomen and perigastric region. Single borderline enlarged portacaval lymph node 11 mm short axis image 28. Reproductive: Uterus  surgically absent. Nonvisualization of ovaries. Other: Scattered ascites.  No free air.  No hernia. Musculoskeletal: Degenerative disc and facet disease changes lumbar spine. IMPRESSION: Cirrhotic liver with scattered ascites and small collaterals in LEFT upper quadrant anterior abdomen and perigastric region. Thickened gallbladder wall, nonspecific in the setting of ascites and cirrhosis. No other acute intra-abdominal or intrapelvic abnormalities. Aortic Atherosclerosis (ICD10-I70.0). Electronically Signed   By: MLavonia DanaM.D.   On: 04/04/2022 16:14    Procedures Procedures  {Document cardiac monitor, telemetry assessment procedure when appropriate:1}  Medications Ordered in ED Medications  iohexol (OMNIPAQUE) 300 MG/ML solution 100 mL (100 mLs Intravenous Contrast Given 04/04/22 1552)  lactated ringers bolus 1,000 mL (1,000 mLs Intravenous New Bag/Given 04/04/22 1722)  morphine (PF) 4 MG/ML injection 4 mg (4 mg Intravenous Given 04/04/22 1718)  ondansetron (ZOFRAN) injection 4 mg (4 mg Intravenous Given 04/04/22 1718)    ED Course/ Medical Decision Making/ A&P                           Medical Decision Making Amount and/or Complexity of Data Reviewed Labs: ordered. Radiology: ordered.  Risk Prescription drug management.   Pt presenting today with a complaint that caries a high risk for morbidity and mortality.  Presenting today with abdominal pain, poor appetite.  Patient does drink alcohol regularly does not seek medical care routinely and now is having the above symptoms.  Concern for pancreatitis, hepatitis, cholecystitis, abdominal mass, gastritis/PUD.  Patient is well-appearing on exam and in no acute distress.  She has had decreased oral intake and most likely has a component of dehydration.  I independently interpreted patient's labs today which show transaminitis with an AST of 646 and an ALT of 437 with an alk phos of 228 and a total bilirubin of 1.4.  Patient's electrolytes,  renal function, CBC are all within normal limits.  Lipase is mildly elevated at 75.  UA without significant findings. I have independently visualized and interpreted pt's images today.  CT of the abdomen pelvis today shows no evidence of obstruction or renal stones.  Radiology reports cirrhotic liver with scattered ascites and small collaterals in the left upper quadrant anterior abdomen and perigastric region.  Thickened gallbladder wall nonspecific in the setting of  ascites and cirrhosis with no other acute abdominal abnormalities.  Given patient's significant right upper quadrant pain with elevated LFTs and T. bili will do a ultrasound for further evaluation.  Patient given IV fluids and pain control.  Also because patient does not have medical insurance at this time is does not have a PCP will consult transition of care for help in finding PCP coverage while she is still working on [employer].  6:28 PM Ultrasound showed changes of cirrhosis with perihepatic ascites and some small layering gravel stones but no evidence of cholecystitis at this time.  Patient received IV fluids and pain medication.  Based on her symptoms and findings feel that she will need to start a PPI, abstain from all alcohol and was given follow-up with a PCP.  She will need close follow-up to ensure her liver function is improving.  She was given return precautions.  Patient p.o. challenged here without difficulty.   {Document critical care time when appropriate:1} {Document review of labs and clinical decision tools ie heart score, Chads2Vasc2 etc:1}  {Document your independent review of radiology images, and any outside records:1} {Document your discussion with family members, caretakers, and with consultants:1} {Document social determinants of health affecting pt's care:1} {Document your decision making why or why not admission, treatments were needed:1} Final Clinical Impression(s) / ED Diagnoses Final diagnoses:  None     Rx / DC Orders ED Discharge Orders     None

## 2022-04-04 NOTE — Progress Notes (Addendum)
Transition of Care [facility]) - Emergency Department Mini Assessment   Patient Details  Name: Angel [name] MRN: [id number] Date of Birth: Jan 24, 1956  [facility]) CM/SW Contact:    Lavenia Atlas, RN Phone Number: 04/04/2022, 5:11 PM   Clinical Narrative: Patient presents to [facility] with c/o LUQ abdominal pain for one month. This RNCM received TOC consult for PCP needs.     ED Mini Assessment: RNCM spoke with patient at bedside however ultrasound was with patient. RNCM will come back to visit with patient. Per chart review patient has a PCP: Bertram Denver with [facility].    TOC will continue to follow   5:50pm RNCM spoke with patient at bedside. Patient reports she has applied for Medicaid and awaiting response. Patient states she was seeing PCP: Bertram Denver at [facility] however she was not able apply for orange card due not having all needed documents. Patient reports she has requested another social security card and will work to get her birth certificate. Patient agrees to be seen for ED visit follow up at [facility], due to being after hours this RNCM is unable to request PCP follow up. RNCM will follow up with patient tomorrow.  Patient reports she has transportation home from the ED if discharged home tonight.  Patient reports may need assistance affording medications if any are prescribed. RNCM will review to see if patient eligible for [facility] program. RNCM explained to patient the [facility] program is annual and if she needs assistance later she may not be able to use again. Patient verbalized understanding. RNCM notified EDP who advised patient may need prescriptions for PPI and antiemetic medication at discharge.   Patient approved for [facility] program and document included in the AVS.  No additional TOC needs at this time.       Patient Contact and Communications  Patient: [phone]   Admission  diagnosis:  SOB, ABD PAIN Patient Active Problem List   Diagnosis Date Noted   Chest pain 04/15/2019   Bronchiectasis without acute exacerbation (HCC) 03/06/2014   Pneumonia 02/19/2014   Elevated BP 02/19/2014   COPD (chronic obstructive pulmonary disease) (HCC) 02/19/2014   Environmental allergies 02/19/2014   COPD exacerbation (HCC) 01/21/2014   HCAP (healthcare-associated pneumonia) 01/21/2014   Smoker 12/31/2013   Acute respiratory failure with hypoxia (HCC) 12/31/2013   Asthma exacerbation 12/30/2013   Asthma exacerbation in COPD (HCC) 12/30/2013   PCP:  Claiborne Rigg, NP Pharmacy:   [facility] [address], [address] [place] [address] Phone: [phone] Fax: [phone]

## 2022-04-04 NOTE — Progress Notes (Signed)
  MATCH Medication Assistance Card Name: Lanetta Figuero ID (VPX):[id number] Bin: [id number] RX Group: [id number] Discharge Date: 04/04/2022 Expiration Date:04/16/2022                                           (must be filled within 7 days of discharge)     You have been approved to have the prescriptions written by your discharging physician filled through our [facility] (Medication Assistance Through [facility]) program. This program allows for a one-time (no refills) 34-day supply of selected medications for a low copay amount.  The copay is $3.00 per prescription. For instance, if you have one prescription, you will pay $3.00; for two prescriptions, you pay $6.00; for three prescriptions, you pay $9.00; and so on.  Only certain pharmacies are participating in this program with [facility]. You will need to select one of the pharmacies from the attached list and take your prescriptions, this letter, and your photo ID to one of the [facility] Outpatient pharmacies, [facility] pharmacy, [facility] [address] or [facility] [address].   We are excited that you are able to use the [facility] program to get your medications. These prescriptions must be filled within 7 days of hospital discharge or they will no longer be valid for the [facility] program. Should you have any problems with your prescriptions please contact your case management team member at [phone] for [place]/ [facility].  Thank you, [facility] Health Care Management

## 2022-04-04 NOTE — Discharge Instructions (Addendum)
MATCH Medication Assistance Card Name: Kieu Quiggle ID (AST):[id number] Bin: [id number]                                          RX Group: [id number] Discharge Date: 04/04/2022 Expiration Date:04/16/2022                                                                                                  (must be filled within 7 days of discharge)         You have been approved to have the prescriptions written by your discharging physician filled through our [facility] (Medication Assistance Through [facility]) program. This program allows for a one-time (no refills) 34-day supply of selected medications for a low copay amount.   The copay is $3.00 per prescription. For instance, if you have one prescription, you will pay $3.00; for two prescriptions, you pay $6.00; for three prescriptions, you pay $9.00; and so on.   Only certain pharmacies are participating in this program with [facility]. You will need to select one of the pharmacies from the attached list and take your prescriptions, this letter, and your photo ID to one of the [facility] Outpatient pharmacies, [facility], [facility] [address] or [facility] [address].    We are excited that you are able to use the [facility] program to get your medications. These prescriptions must be filled within 7 days of hospital discharge or they will no longer be valid for the [facility] program. Should you have any problems with your prescriptions please contact your case management team member at [phone] for [place]/ [facility].   Thank you, [facility] Health Care Management         Avoid all ibuprofen and naproxen.  You can try Voltaren gel.  Avoid all alcohol.  He will start the antacid for your stomach and you will take that daily for the next 2 weeks.  You should start noticing improvement within the next 4 to 5 days.  If you start running fever, having severe abdominal pain, vomiting  and cannot hold anything down return to the emergency room.

## 2022-04-05 ENCOUNTER — Telehealth: Payer: Self-pay

## 2022-04-05 NOTE — Telephone Encounter (Signed)
RNCM spoke with patient to advise f/u ED visit PCP appointment is scheduled with Bertram Denver NP- [facility] at 9:30am on 05/02/22.. This RNCM advised [facility] will call if they have an earlier appointment. Patient verbalized understanding.   No additional TOC needs at this time.

## 2022-04-11 ENCOUNTER — Encounter (HOSPITAL_COMMUNITY): Payer: Self-pay

## 2022-04-11 ENCOUNTER — Other Ambulatory Visit: Payer: Self-pay

## 2022-04-11 ENCOUNTER — Ambulatory Visit: Payer: Self-pay | Admitting: *Deleted

## 2022-04-11 ENCOUNTER — Inpatient Hospital Stay (HOSPITAL_COMMUNITY)
Admission: EM | Admit: 2022-04-11 | Discharge: 2022-04-13 | DRG: 433 | Disposition: A | Discharge status: Home / Self Care | Payer: Self-pay | Attending: Internal Medicine | Admitting: Internal Medicine

## 2022-04-11 ENCOUNTER — Emergency Department (HOSPITAL_COMMUNITY): Payer: Self-pay

## 2022-04-11 DIAGNOSIS — K5900 Constipation, unspecified: Secondary | ICD-10-CM | POA: Diagnosis present

## 2022-04-11 DIAGNOSIS — E785 Hyperlipidemia, unspecified: Secondary | ICD-10-CM

## 2022-04-11 DIAGNOSIS — R748 Abnormal levels of other serum enzymes: Secondary | ICD-10-CM

## 2022-04-11 DIAGNOSIS — Z79899 Other long term (current) drug therapy: Secondary | ICD-10-CM

## 2022-04-11 DIAGNOSIS — E440 Moderate protein-calorie malnutrition: Secondary | ICD-10-CM | POA: Diagnosis present

## 2022-04-11 DIAGNOSIS — Z880 Allergy status to penicillin: Secondary | ICD-10-CM

## 2022-04-11 DIAGNOSIS — Z825 Family history of asthma and other chronic lower respiratory diseases: Secondary | ICD-10-CM

## 2022-04-11 DIAGNOSIS — Z7141 Alcohol abuse counseling and surveillance of alcoholic: Secondary | ICD-10-CM

## 2022-04-11 DIAGNOSIS — E8809 Other disorders of plasma-protein metabolism, not elsewhere classified: Secondary | ICD-10-CM | POA: Diagnosis present

## 2022-04-11 DIAGNOSIS — I10 Essential (primary) hypertension: Secondary | ICD-10-CM | POA: Diagnosis present

## 2022-04-11 DIAGNOSIS — Z7951 Long term (current) use of inhaled steroids: Secondary | ICD-10-CM

## 2022-04-11 DIAGNOSIS — F17200 Nicotine dependence, unspecified, uncomplicated: Secondary | ICD-10-CM | POA: Diagnosis present

## 2022-04-11 DIAGNOSIS — F17210 Nicotine dependence, cigarettes, uncomplicated: Secondary | ICD-10-CM | POA: Diagnosis present

## 2022-04-11 DIAGNOSIS — D6959 Other secondary thrombocytopenia: Secondary | ICD-10-CM | POA: Diagnosis present

## 2022-04-11 DIAGNOSIS — F1010 Alcohol abuse, uncomplicated: Secondary | ICD-10-CM | POA: Diagnosis present

## 2022-04-11 DIAGNOSIS — R109 Unspecified abdominal pain: Secondary | ICD-10-CM | POA: Diagnosis present

## 2022-04-11 DIAGNOSIS — J449 Chronic obstructive pulmonary disease, unspecified: Secondary | ICD-10-CM | POA: Diagnosis present

## 2022-04-11 DIAGNOSIS — Z6823 Body mass index (BMI) 23.0-23.9, adult: Secondary | ICD-10-CM

## 2022-04-11 DIAGNOSIS — K7460 Unspecified cirrhosis of liver: Principal | ICD-10-CM | POA: Diagnosis present

## 2022-04-11 DIAGNOSIS — K7031 Alcoholic cirrhosis of liver with ascites: Secondary | ICD-10-CM

## 2022-04-11 DIAGNOSIS — R1084 Generalized abdominal pain: Principal | ICD-10-CM

## 2022-04-11 DIAGNOSIS — R188 Other ascites: Secondary | ICD-10-CM | POA: Diagnosis present

## 2022-04-11 DIAGNOSIS — Z90710 Acquired absence of both cervix and uterus: Secondary | ICD-10-CM

## 2022-04-11 DIAGNOSIS — Z87891 Personal history of nicotine dependence: Secondary | ICD-10-CM

## 2022-04-11 LAB — COMPREHENSIVE METABOLIC PANEL
ALT: 287 U/L — ABNORMAL HIGH (ref 0–44)
AST: 462 U/L — ABNORMAL HIGH (ref 15–41)
Albumin: 2.2 g/dL — ABNORMAL LOW (ref 3.5–5.0)
Alkaline Phosphatase: 162 U/L — ABNORMAL HIGH (ref 38–126)
Anion gap: 5 (ref 5–15)
BUN: 17 mg/dL (ref 8–23)
CO2: 24 mmol/L (ref 22–32)
Calcium: 8.3 mg/dL — ABNORMAL LOW (ref 8.9–10.3)
Chloride: 108 mmol/L (ref 98–111)
Creatinine, Ser: 0.81 mg/dL (ref 0.44–1.00)
GFR, Estimated: >60 mL/min (ref >60)
Glucose, Bld: 93 mg/dL (ref 70–99)
Potassium: 3.9 mmol/L (ref 3.5–5.1)
Sodium: 137 mmol/L (ref 135–145)
Total Bilirubin: 3 mg/dL — ABNORMAL HIGH (ref 0.3–1.2)
Total Protein: 8.4 g/dL — ABNORMAL HIGH (ref 6.5–8.1)

## 2022-04-11 LAB — URINALYSIS, ROUTINE W REFLEX MICROSCOPIC
Bilirubin Urine: NEGATIVE
Glucose, UA: NEGATIVE mg/dL
Hgb urine dipstick: NEGATIVE
Ketones, ur: NEGATIVE mg/dL
Nitrite: NEGATIVE
Protein, ur: NEGATIVE mg/dL
Specific Gravity, Urine: 1.025 (ref 1.005–1.030)
WBC, UA: >50 WBC/hpf — ABNORMAL HIGH (ref 0–5)
pH: 5 (ref 5.0–8.0)

## 2022-04-11 LAB — CBC WITH DIFFERENTIAL/PLATELET
Abs Immature Granulocytes: 0.01 10*3/uL (ref 0.00–0.07)
Basophils Absolute: 0.1 10*3/uL (ref 0.0–0.1)
Basophils Relative: 1 %
Eosinophils Absolute: 0.1 10*3/uL (ref 0.0–0.5)
Eosinophils Relative: 1 %
HCT: 43.8 % (ref 36.0–46.0)
Hemoglobin: 14.7 g/dL (ref 12.0–15.0)
Immature Granulocytes: 0 %
Lymphocytes Relative: 26 %
Lymphs Abs: 2 10*3/uL (ref 0.7–4.0)
MCH: 31.1 pg (ref 26.0–34.0)
MCHC: 33.6 g/dL (ref 30.0–36.0)
MCV: 92.8 fL (ref 80.0–100.0)
Monocytes Absolute: 0.9 10*3/uL (ref 0.1–1.0)
Monocytes Relative: 13 %
Neutro Abs: 4.4 10*3/uL (ref 1.7–7.7)
Neutrophils Relative %: 59 %
Platelets: 161 10*3/uL (ref 150–400)
RBC: 4.72 MIL/uL (ref 3.87–5.11)
RDW: 16.7 % — ABNORMAL HIGH (ref 11.5–15.5)
WBC: 7.5 10*3/uL (ref 4.0–10.5)
nRBC: 0 % (ref 0.0–0.2)

## 2022-04-11 LAB — PROTIME-INR
INR: 1.5 — ABNORMAL HIGH (ref 0.8–1.2)
Prothrombin Time: 17.8 seconds — ABNORMAL HIGH (ref 11.4–15.2)

## 2022-04-11 LAB — IRON AND TIBC
Iron: 221 ug/dL — ABNORMAL HIGH (ref 28–170)
Saturation Ratios: 81 % — ABNORMAL HIGH (ref 10.4–31.8)
TIBC: 272 ug/dL (ref 250–450)
UIBC: 51 ug/dL

## 2022-04-11 LAB — LIPASE, BLOOD: Lipase: 58 U/L — ABNORMAL HIGH (ref 11–51)

## 2022-04-11 LAB — HEPATITIS PANEL, ACUTE
HCV Ab: NONREACTIVE
Hep A IgM: NONREACTIVE
Hep B C IgM: NONREACTIVE
Hepatitis B Surface Ag: NONREACTIVE

## 2022-04-11 LAB — RAPID HIV SCREEN (HIV 1/2 AB+AG)
HIV 1/2 Antibodies: NONREACTIVE
HIV-1 P24 Antigen - HIV24: NONREACTIVE

## 2022-04-11 LAB — FERRITIN: Ferritin: 353 ng/mL — ABNORMAL HIGH (ref 11–307)

## 2022-04-11 MED ORDER — ONDANSETRON HCL 4 MG/2ML IJ SOLN
4.0000 mg | Freq: Three times a day (TID) | INTRAMUSCULAR | Status: DC | PRN
Start: 2022-04-11 — End: 2022-04-13

## 2022-04-11 MED ORDER — MOMETASONE FURO-FORMOTEROL FUM 200-5 MCG/ACT IN AERO
2.0000 | INHALATION_SPRAY | Freq: Two times a day (BID) | RESPIRATORY_TRACT | Status: DC
  Administered 2022-04-12 – 2022-04-13 (×3): 2 via RESPIRATORY_TRACT
  Filled 2022-04-11: qty 8.8

## 2022-04-11 MED ORDER — IPRATROPIUM-ALBUTEROL 0.5-2.5 (3) MG/3ML IN SOLN: 3.0000 mL | Freq: Four times a day (QID) | RESPIRATORY_TRACT | Status: DC | PRN

## 2022-04-11 MED ORDER — PANTOPRAZOLE SODIUM 40 MG PO TBEC
40.0000 mg | DELAYED_RELEASE_TABLET | Freq: Every day | ORAL | Status: DC
  Administered 2022-04-12 – 2022-04-13 (×2): 40 mg via ORAL
  Filled 2022-04-11 (×2): qty 1

## 2022-04-11 MED ORDER — ADULT MULTIVITAMIN W/MINERALS CH
ORAL_TABLET | Freq: Every day | ORAL | Status: DC
  Administered 2022-04-12 – 2022-04-13 (×2): 1 via ORAL
  Filled 2022-04-11 (×2): qty 1

## 2022-04-11 MED ORDER — ONDANSETRON HCL 4 MG/2ML IJ SOLN
4.0000 mg | Freq: Once | INTRAMUSCULAR | Status: AC
  Administered 2022-04-11: 4 mg via INTRAVENOUS
  Filled 2022-04-11: qty 2

## 2022-04-11 MED ORDER — LACTATED RINGERS IV BOLUS
1000.0000 mL | Freq: Once | INTRAVENOUS | Status: AC
  Administered 2022-04-11: 1000 mL via INTRAVENOUS

## 2022-04-11 MED ORDER — PANTOPRAZOLE SODIUM 40 MG IV SOLR
40.0000 mg | Freq: Once | INTRAVENOUS | Status: AC
Start: 2022-04-11 — End: 2022-04-11
  Administered 2022-04-11: 40 mg via INTRAVENOUS
  Filled 2022-04-11: qty 10

## 2022-04-11 MED ORDER — IBUPROFEN 400 MG PO TABS: 400.0000 mg | ORAL_TABLET | Freq: Four times a day (QID) | ORAL | Status: DC | PRN

## 2022-04-11 MED ORDER — MORPHINE SULFATE (PF) 4 MG/ML IV SOLN
4.0000 mg | Freq: Once | INTRAVENOUS | Status: AC
  Administered 2022-04-11: 4 mg via INTRAVENOUS
  Filled 2022-04-11: qty 1

## 2022-04-11 NOTE — ED Triage Notes (Signed)
Pt c/o pain ongoing abdominal pain and swelling. Pt reports recent weight gain as much as 5lbs in one day. Pt denies N/V/D. Pt also endorsing SOB

## 2022-04-11 NOTE — ED Provider Notes (Signed)
[facility]-EMERGENCY DEPT Provider Note   CSN: [id number] Arrival date & time: 04/11/22  1112     History  Chief Complaint  Patient presents with   Abdominal Pain    Angel [name] is a 66 y.o. female.  Patient presents to the hospital complaining of ongoing abdominal pain and swelling.  Patient was seen at the emergency department last week and diagnosed with cirrhosis of liver with ascites.  Patient has no primary care provider and was not admitted for further work-up at that time.  Since being discharged home she states that her pain has been ongoing with some worsening and that she has gained 5 pounds over the past week.  Patient also complaining of mild shortness of breath.  Patient is denying nausea, vomiting, diarrhea at this time.  Denies chest pain. Patient states that since the emergency department visit she has taken no NSAID medication and has not consumed alcohol.  Prior to that visit patient states that she was consuming on average 3 glasses of wine daily.  Past medical history otherwise significant for asthma, COPD, hypertension HPI     Home Medications Prior to Admission medications   Medication Sig Start Date End Date Taking? Authorizing Provider  albuterol (VENTOLIN HFA) 108 (90 Base) MCG/ACT inhaler Inhale 2 puffs into the lungs every 6 (six) hours as needed for wheezing or shortness of breath. 04/17/19   Johnson, Clanford L, MD  amLODipine (NORVASC) 5 MG tablet Take 1 tablet (5 mg total) by mouth daily. 05/05/19 08/03/19  Claiborne Rigg, NP  atorvastatin (LIPITOR) 20 MG tablet Take 1 tablet (20 mg total) by mouth daily. 05/09/19   Claiborne Rigg, NP  Fluticasone-Salmeterol (ADVAIR) 250-50 MCG/DOSE AEPB Inhale 1 puff into the lungs every 12 (twelve) hours. 05/05/19   Claiborne Rigg, NP  ipratropium-albuterol (DUONEB) 0.5-2.5 (3) MG/3ML SOLN Take 3 mLs by nebulization every 4 (four) hours as needed. 05/05/19   Claiborne Rigg, NP  omeprazole  (PRILOSEC) 20 MG capsule Take 1 capsule (20 mg total) by mouth daily for 14 days. 04/04/22 04/18/22  Gwyneth Sprout, MD      Allergies    Penicillins    Review of Systems   Review of Systems  Constitutional:  Negative for fever.  Respiratory:  Positive for shortness of breath. Negative for cough.   Cardiovascular:  Negative for chest pain.  Gastrointestinal:  Positive for abdominal pain. Negative for blood in stool, constipation, diarrhea, nausea and vomiting.  Genitourinary:  Negative for dysuria.  Skin:  Negative for color change.    Physical Exam Updated Vital Signs BP 129/72   Pulse 84   Temp 97.9 F (36.6 C) (Oral)   Resp 18   Ht 5\' 7"  (1.702 m)   Wt 68.9 kg   SpO2 97%   BMI 23.81 kg/m  Physical Exam Vitals and nursing note reviewed.  Constitutional:      General: She is not in acute distress. HENT:     Head: Normocephalic and atraumatic.  Eyes:     General: No scleral icterus.    Extraocular Movements: Extraocular movements intact.  Cardiovascular:     Rate and Rhythm: Normal rate and regular rhythm.     Heart sounds: Normal heart sounds.  Pulmonary:     Effort: Pulmonary effort is normal.     Breath sounds: Normal breath sounds.  Abdominal:     General: Abdomen is protuberant. There is distension.     Palpations: There is shifting dullness.  Tenderness: There is abdominal tenderness (Mild generalized tenderness to palpation).  Skin:    General: Skin is warm and dry.     Capillary Refill: Capillary refill takes less than 2 seconds.     Coloration: Skin is not jaundiced.  Neurological:     Mental Status: She is alert and oriented to person, place, and time.     ED Results / Procedures / Treatments   Labs (all labs ordered are listed, but only abnormal results are displayed) Labs Reviewed  CBC WITH DIFFERENTIAL/PLATELET - Abnormal; Notable for the following components:      Result Value   RDW 16.7 (*)    All other components within normal limits   COMPREHENSIVE METABOLIC PANEL - Abnormal; Notable for the following components:   Calcium 8.3 (*)    Total Protein 8.4 (*)    Albumin 2.2 (*)    AST 462 (*)    ALT 287 (*)    Alkaline Phosphatase 162 (*)    Total Bilirubin 3.0 (*)    All other components within normal limits  LIPASE, BLOOD - Abnormal; Notable for the following components:   Lipase 58 (*)    All other components within normal limits  PROTIME-INR - Abnormal; Notable for the following components:   Prothrombin Time 17.8 (*)    INR 1.5 (*)    All other components within normal limits  URINALYSIS, ROUTINE W REFLEX MICROSCOPIC  RAPID HIV SCREEN (HIV 1/2 AB+AG)  HEPATITIS PANEL, ACUTE    EKG EKG Interpretation  Date/Time:  Wednesday April 11 2022 11:41:15 EDT Ventricular Rate:  84 PR Interval:  147 QRS Duration: 110 QT Interval:  417 QTC Calculation: 493 R Axis:   71 Text Interpretation: Sinus rhythm Low voltage, precordial leads Borderline prolonged QT interval Confirmed by Kristine Royal [phone]) on 04/11/2022 11:49:11 AM  Radiology DG Chest 2 View  Result Date: 04/11/2022 CLINICAL DATA:  Shortness of breath. EXAM: CHEST - 2 VIEW COMPARISON:  04/15/2019 FINDINGS: The lungs are clear without focal pneumonia, edema, pneumothorax or pleural effusion. Atelectasis noted left base. The cardiopericardial silhouette is within normal limits for size. Telemetry leads overlie the chest. IMPRESSION: Left base atelectasis.  Otherwise no acute findings. Electronically Signed   By: Kennith Center M.D.   On: 04/11/2022 11:57    Procedures Procedures    Medications Ordered in ED Medications  lactated ringers bolus 1,000 mL (1,000 mLs Intravenous New Bag/Given 04/11/22 1214)  morphine (PF) 4 MG/ML injection 4 mg (4 mg Intravenous Given 04/11/22 1207)  ondansetron (ZOFRAN) injection 4 mg (4 mg Intravenous Given 04/11/22 1205)  pantoprazole (PROTONIX) injection 40 mg (40 mg Intravenous Given 04/11/22 1210)    ED Course/ Medical Decision  Making/ A&P                           Medical Decision Making Amount and/or Complexity of Data Reviewed Labs: ordered. Radiology: ordered.  Risk Prescription drug management.   Patient presents with a chief concern of abdominal pain and distention.  Differential includes cirrhosis, alcohol hepatitis, acute liver failure, heart failure, and others.  I reviewed the notes from the emergency department visit from last week.  CT abdomen pelvis showed a cirrhotic liver with scattered ascites and small collaterals in the left upper quadrant anterior abdomen and perigastric region.  Thickened gallbladder wall.  Right upper quadrant ultrasound showed changes of cirrhosis with perihepatic ascites.  I ordered and reviewed labs.  Pertinent results include lipase  58, slightly improved from last week; significantly elevated liver enzymes with slight improvement from 1 week ago; total bilirubin of 3.0  I ordered and interpreted a chest x-ray which showed left base atelectasis with no other acute findings  I ordered medication for the patient including Protonix, morphine, Zofran, and an LR bolus.  Upon reassessment the patient's pain has improved  I requested consultation with the hospitalist, Dr. Ronaldo Miyamoto, who agreed to admit the patient for observation  I requested consultation with gastroenterology, [facility], who agreed to consult on the patient while admitted.  Patient has what appears to be cirrhosis of unknown etiology at this time. With no outpatient follow up planned in the immediate future, believe patient would benefit from admission for full workup. Admit to hospital        Final Clinical Impression(s) / ED Diagnoses Final diagnoses:  Generalized abdominal pain  Other ascites  Elevated liver enzymes    Rx / DC Orders ED Discharge Orders     None         Pamala Duffel 04/11/22 1348    Wynetta Fines, MD 04/14/22 [phone]

## 2022-04-11 NOTE — Telephone Encounter (Signed)
    Chief Complaint: abdominal pain and swelling Symptoms: 5 pound weight gain, abdominal pian, swelling Frequency: 1 week Pertinent Negatives: Patient denies back pain, diarrhea, fever, urination pain, vomiting Disposition: [x] ED /[] Urgent Care (no appt availability in office) / [] Appointment(In office/virtual)/ []  [facility]/ [] Home Care/ [] Refused Recommended Disposition /[] [facility]/ []  Follow-up with PCP Additional Notes: Patient advised over 3 lb gain- be seen- no open appointment- patient prefers go back to ED   Reason for Disposition  [1] SEVERE pain AND [2] age > 60 years  Answer Assessment - Initial Assessment Questions 1. LOCATION: "Where does it hurt?"      All over 2. RADIATION: "Does the pain shoot anywhere else?" (e.g., chest, back)     no 3. ONSET: "When did the pain begin?" (e.g., minutes, hours or days ago)      3 weeks 4. SUDDEN: "Gradual or sudden onset?"     sudden- constant sore 5. PATTERN "Does the pain come and go, or is it constant?"    - If constant: "Is it getting better, staying the same, or worsening?"      (Note: Constant means the pain never goes away completely; most serious pain is constant and it progresses)     - If intermittent: "How long does it last?" "Do you have pain now?"     (Note: Intermittent means the pain goes away completely between bouts)     Constant- worse 6. SEVERITY: "How bad is the pain?"  (e.g., Scale 1-10; mild, moderate, or severe)   - MILD (1-3): doesn't interfere with normal activities, abdomen soft and not tender to touch    - MODERATE (4-7): interferes with normal activities or awakens from sleep, abdomen tender to touch    - SEVERE (8-10): excruciating pain, doubled over, unable to do any normal activities      severe 7. RECURRENT SYMPTOM: "Have you ever had this type of stomach pain before?" If Yes, ask: "When was the last time?" and "What happened that time?"      Yes- ED 1 week ago- 5 lb  increase 8. CAUSE: "What do you think is causing the stomach pain?"     Fluid retention  9. RELIEVING/AGGRAVATING FACTORS: "What makes it better or worse?" (e.g., movement, antacids, bowel movement)      none 10. OTHER SYMPTOMS: "Do you have any other symptoms?" (e.g., back pain, diarrhea, fever, urination pain, vomiting)       fatigue 11. PREGNANCY: "Is there any chance you are pregnant?" "When was your last menstrual period?"  Protocols used: Abdominal Pain - [facility]

## 2022-04-11 NOTE — ED Notes (Signed)
Transport called.

## 2022-04-11 NOTE — Consult Note (Signed)
Referring Provider: [facility] Primary Care Physician:  Claiborne Rigg, NP Primary Gastroenterologist:  Gentry Fitz  Reason for Consultation:  Abdominal pain, cirrhosis, ascites  HPI: Angel [name] is a 66 y.o. female  with medical history significant of COPD, tobacco abuse, EtOH abuse, cirrhosis, HTN, HLD.   Patient was seen at the emergency department last Wednesday for abdominal pain and diagnosed with cirrhosis of liver with ascites.  Patient has no primary care provider and was not admitted for further work-up at that time.   Since being discharged home she states that her pain has been ongoing with some worsening and that she has gained 5 pounds over the past week.  Reports 12 pound weight gain over the last 3 weeks.  Patient also complaining of mild shortness of breath.  Has not been able to see primary care doctor for her cirrhosis.   Denies NSAID use.  Denies IV drug use.  Denies blood thinner use. Denies alcohol use since cirrhosis diagnosis last week. Prior to her cirrhosis diagnosis she reports drinking wine daily on average 3 glasses and a shot of fireball daily. Many years ago she was a heavier drinker.  She uses occasional marijuana edibles for arthritic pain.   History of colon cancer in her mother at age 57 her sister also had colon polyps.  Patient has no previous colonoscopy due to lack of insurance. No previous EGD. Patient has history of alcoholism and liver disease in her uncles.   Past Medical History:  Diagnosis Date   Asthma    Bronchiolitis    COPD (chronic obstructive pulmonary disease) (HCC)    Hypertension     Past Surgical History:  Procedure Laterality Date   ABDOMINAL HYSTERECTOMY     APPENDECTOMY     BACK SURGERY      Prior to Admission medications   Medication Sig Start Date End Date Taking? Authorizing Provider  albuterol (VENTOLIN HFA) 108 (90 Base) MCG/ACT inhaler Inhale 2 puffs into the lungs every 6 (six) hours as needed for wheezing or shortness  of breath. 04/17/19   Johnson, Clanford L, MD  amLODipine (NORVASC) 5 MG tablet Take 1 tablet (5 mg total) by mouth daily. 05/05/19 08/03/19  Claiborne Rigg, NP  atorvastatin (LIPITOR) 20 MG tablet Take 1 tablet (20 mg total) by mouth daily. 05/09/19   Claiborne Rigg, NP  Fluticasone-Salmeterol (ADVAIR) 250-50 MCG/DOSE AEPB Inhale 1 puff into the lungs every 12 (twelve) hours. 05/05/19   Claiborne Rigg, NP  ipratropium-albuterol (DUONEB) 0.5-2.5 (3) MG/3ML SOLN Take 3 mLs by nebulization every 4 (four) hours as needed. 05/05/19   Claiborne Rigg, NP  omeprazole (PRILOSEC) 20 MG capsule Take 1 capsule (20 mg total) by mouth daily for 14 days. 04/04/22 04/18/22  Gwyneth Sprout, MD    Scheduled Meds: Continuous Infusions: PRN Meds:.  Allergies as of 04/11/2022 - Review Complete 04/11/2022  Allergen Reaction Noted   Penicillins Hives 12/30/2013    Family History  Problem Relation Age of Onset   CAD Father    Heart disease Father    Colon cancer Mother    Breast cancer Sister    Emphysema Paternal Grandfather        never smoker, Theatre stage manager    Social History   Socioeconomic History   Marital status: Divorced    Spouse name: Not on file   Number of children: Not on file   Years of education: Not on file   Highest education level: Not on file  Occupational History   Occupation: Unemployed  Tobacco Use   Smoking status: Former    Packs/day: 0.50    Years: 30.00    Total pack years: 15.00    Types: Cigarettes    Quit date: 12/30/2013    Years since quitting: 8.2   Smokeless tobacco: Never  Substance and Sexual Activity   Alcohol use: Yes    Alcohol/week: 28.0 standard drinks of alcohol    Types: 28 Glasses of wine per week   Drug use: Yes    Frequency: 3.0 times per week    Types: Marijuana   Sexual activity: Not Currently  Other Topics Concern   Not on file  Social History Narrative   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 Negative for chills and fever.  HENT:  Negative for hearing loss and tinnitus.   Eyes:  Negative for blurred vision and double vision.  Respiratory:  Positive for shortness of breath. Negative for cough and hemoptysis.   Cardiovascular:  Negative for chest pain and palpitations.  Gastrointestinal:  Positive for abdominal pain and constipation. Negative for blood in stool, diarrhea, heartburn, melena, nausea and vomiting.  Genitourinary:  Negative for dysuria and urgency.  Musculoskeletal:  Negative for myalgias and neck pain.  Skin:  Negative for itching and rash.  Neurological:  Positive for weakness. Negative for dizziness and headaches.  Endo/Heme/Allergies:  Negative for environmental allergies. Does not bruise/bleed easily.  Psychiatric/Behavioral:  Positive for substance abuse. Negative for depression and suicidal ideas.      Physical Exam:Physical Exam Constitutional:      General: She is not in acute distress.    Appearance: Normal appearance.  HENT:     Head: Normocephalic and atraumatic.     Right Ear: External ear normal.     Left Ear: External ear normal.     Nose: Nose normal.     Mouth/Throat:     Mouth: Mucous membranes are moist.  Eyes:     Pupils: Pupils are equal, round, and reactive to light.  Cardiovascular:     Rate and Rhythm: Normal rate and regular rhythm.     Pulses: Normal pulses.     Heart sounds: Normal heart sounds.  Pulmonary:     Effort: Pulmonary effort is normal.     Breath sounds:  Normal breath sounds.  Abdominal:     General: Abdomen is flat. Bowel sounds are normal. There is distension (mild).     Palpations: Abdomen is soft. There is no mass.     Tenderness: There is abdominal tenderness (mild generalized). There is no guarding or rebound.     Hernia: No hernia is present.  Musculoskeletal:        General: No swelling. Normal range of motion.     Cervical back: Normal range of motion and neck supple.  Skin:    General: Skin is warm and dry.  Neurological:     General: No  focal deficit present.     Mental Status: She is alert and oriented to person, place, and time. Mental status is at baseline.  Psychiatric:        Mood and Affect: Mood normal.        Behavior: Behavior normal.     Vital signs: Vitals:   04/11/22 1245 04/11/22 1300  BP: 114/73 129/72  Pulse: 84 84  Resp: 17 18  Temp:    SpO2: 97% 97%        GI:  Lab Results: Recent Labs    04/11/22 1158  WBC 7.5  HGB 14.7  HCT 43.8  PLT 161   BMET Recent Labs    04/11/22 1158  NA 137  K 3.9  CL 108  CO2 24  GLUCOSE 93  BUN 17  CREATININE 0.81  CALCIUM 8.3*   LFT Recent Labs    04/11/22 1158  PROT 8.4*  ALBUMIN 2.2*  AST 462*  ALT 287*  ALKPHOS 162*  BILITOT 3.0*   PT/INR Recent Labs    04/11/22 1257  LABPROT 17.8*  INR 1.5*     Studies/Results: DG Chest 2 View  Result Date: 04/11/2022 CLINICAL DATA:  Shortness of breath. EXAM: CHEST - 2 VIEW COMPARISON:  04/15/2019 FINDINGS: The lungs are clear without focal pneumonia, edema, pneumothorax or pleural effusion. Atelectasis noted left base. The cardiopericardial silhouette is within normal limits for size. Telemetry leads overlie the chest. IMPRESSION: Left base atelectasis.  Otherwise no acute findings. Electronically Signed   By: Kennith Center M.D.   On: 04/11/2022 11:57    Impression: Cirrhosis with ascites Alcohol abuse  HGB 14.7 Platelets 161 AST 462 ALT 287  Alkphos 162 TBili 3.0 GFR >60  INR 04/11/2022  1.5  MELD 3.0: 18 at 04/11/2022 12:57 PM MELD-Na: 15 at 04/11/2022 12:57 PM Calculated from: Serum Creatinine: 0.81 mg/dL (Using min of 1 mg/dL) at 12/07/5174 16:07 AM Serum Sodium: 137 mmol/L at 04/11/2022 11:58 AM Total Bilirubin: 3.0 mg/dL at 12/12/1060 69:48 AM Serum Albumin: 2.2 g/dL at 02/08/6269 35:00 AM INR(ratio): 1.5 at 04/11/2022 12:57 PM Age at listing (hypothetical): 40 years Sex: Female at 04/11/2022 12:57 PM  Right upper quadrant ultrasound 04/04/2022 Changes of cirrhosis.  Perihepatic ascites. Few small layering gravel stones. Mild gallbladder wall thickening. No sonographic Murphy sign. Wall thickening most likely related to liver disease, less likely cholecystitis.  No evidence of hepatic encephalopathy today.  Slight distention of the abdomen and some weight gain possible abdominal ascites.  Patient with previous right upper quadrant ultrasound with perihepatic ascites.  Patient has never undergone EGD or colonoscopy.  Unknown history of esophageal varices. No evidence of hepatocellular carcinoma on recent right upper quadrant ultrasound.  Plan: We will order work-up for liver cirrhosis including ASMA, AMA, ferritin, iron, ceruloplasmin, alpha 1 antitrypsin deficiency, and hepatitis panel for further evaluation of cirrhosis etiology. We will order alpha-fetoprotein for evaluation of hepatocellular carcinoma. Ordered paracentesis with labs for evaluation of SBP. Consider MiraLAX for patient's constipation. Recommend outpatient follow-up for colonoscopy for colon cancer screening and EGD for evaluation of esophageal varices. [facility] will follow  LOS: 0 days   Emmit Alexanders  PA-C 04/11/2022, 1:56 PM  Contact #  [phone]

## 2022-04-11 NOTE — H&P (Signed)
History and Physical    Patient: Angel [name] DOB: 1955/10/13 DOA: 04/11/2022 DOS: the patient was seen and examined on 04/11/2022 PCP: Gildardo Pounds, NP  Patient coming from: Home  Chief Complaint:  Chief Complaint  Patient presents with   Abdominal Pain   HPI: Angel [name] is a 66 y.o. female with medical history significant of COPD, tobacco abuse, EtOH abuse, cirrhosis, HTN, HLD. Presenting with abdominal pain. Symptoms started 3 weeks ago. She has had a generalized burning sensation and distention of her abdomen. She didn't try any medications at home. She went to the ED on 6/28. She was diagnosed with cirrhosis. She was started on PPI and told to abstain for alcohol. She reports that she has not drank since that time. However, her abdominal pain persists and she has noticed more swelling in her stomach.She has noticed that she has gained 5lbs over the last week. When her symptoms did not improve, she decided to come back to the ED for evaluation. She denies any other aggravating or alleviating factors.      Review of Systems: As mentioned in the history of present illness. All other systems reviewed and are negative. Past Medical History:  Diagnosis Date   Asthma    Bronchiolitis    COPD (chronic obstructive pulmonary disease) ([place])    Hypertension    Past Surgical History:  Procedure Laterality Date   ABDOMINAL HYSTERECTOMY     APPENDECTOMY     BACK SURGERY     Social History:  reports that she quit smoking about 8 years ago. Her smoking use included cigarettes. She has a 15.00 pack-year smoking history. She has never used smokeless tobacco. She reports current alcohol use of about 28.0 standard drinks of alcohol per week. She reports current drug use. Frequency: 3.00 times per week. Drug: Marijuana.  Allergies  Allergen Reactions   Penicillins Hives    Family History  Problem Relation Age of Onset   CAD Father    Heart disease Father    Colon cancer  Mother    Breast cancer Sister    Emphysema Paternal Grandfather        never smoker, IT trainer    Prior to Admission medications   Medication Sig Start Date End Date Taking? Authorizing Provider  albuterol (VENTOLIN HFA) 108 (90 Base) MCG/ACT inhaler Inhale 2 puffs into the lungs every 6 (six) hours as needed for wheezing or shortness of breath. 04/17/19   Johnson, Clanford L, MD  amLODipine (NORVASC) 5 MG tablet Take 1 tablet (5 mg total) by mouth daily. 05/05/19 08/03/19  Gildardo Pounds, NP  atorvastatin (LIPITOR) 20 MG tablet Take 1 tablet (20 mg total) by mouth daily. 05/09/19   Gildardo Pounds, NP  Fluticasone-Salmeterol (ADVAIR) 250-50 MCG/DOSE AEPB Inhale 1 puff into the lungs every 12 (twelve) hours. 05/05/19   Gildardo Pounds, NP  ipratropium-albuterol (DUONEB) 0.5-2.5 (3) MG/3ML SOLN Take 3 mLs by nebulization every 4 (four) hours as needed. 05/05/19   Gildardo Pounds, NP  omeprazole (PRILOSEC) 20 MG capsule Take 1 capsule (20 mg total) by mouth daily for 14 days. 04/04/22 04/18/22  Blanchie Dessert, MD    Physical Exam: Vitals:   04/11/22 1215 04/11/22 1230 04/11/22 1245 04/11/22 1300  BP:  120/71 114/73 129/72  Pulse: 89 88 84 84  Resp: (!) 24 (!) '26 17 18  ' Temp:      TempSrc:      SpO2: 96% 95% 97% 97%  Weight:  Height:       General: 66 y.o. female resting in bed in NAD Eyes: PERRL, normal sclera ENMT: Nares patent w/o discharge, orophaynx clear, dentition normal, ears w/o discharge/lesions/ulcers Neck: Supple, trachea midline Cardiovascular: RRR, +S1, S2, no m/g/r, equal pulses throughout Respiratory: CTABL, no w/r/r, normal WOB GI: BS+, mild distention, NT, no masses noted, no organomegaly noted MSK: No e/c/c Neuro: A&O x 3, no focal deficits Psyc: Appropriate interaction and affect, calm/cooperative  Data Reviewed:  Na+  137 K+  3.9 Scr  0.81 Calcium  8.3 Albumin 2.2 Alk phos  162 Lipase 58 AST  462 ALT  287 Bili  3.0 WBC  7.5 Hgb  14.7 Plt   161  CXR: Left base atelectasis.  Otherwise no acute findings.  Assessment and Plan: Abdominal pain Hx of cirrhosis     - place in obs, med-surg     - discriminant fxn: 25.1     - her lfts are improving since her ED visit on 6/28     - hepatitis panel pending     - paracentesis ordered for ascites     - start lasix/spironolactone as BP tolerates; right now her pressures are soft     - [facility] onboard, appreciate assistance  Tobacco abuse     - counsel against further use  Hypoalbuminemia     - check prealbumin     - dietician consult  COPD     - continue home regimen; add PRN duoneb  HLD     - holding statin for now  Hx of HTN     - pressures are soft right now, hold home regimen  Advance Care Planning:   Code Status: FULL  Consults: EDPA to speak with [facility]  Family Communication: None at bedside  Severity of Illness: The appropriate patient status for this patient is OBSERVATION. Observation status is judged to be reasonable and necessary in order to provide the required intensity of service to ensure the patient's safety. The patient's presenting symptoms, physical exam findings, and initial radiographic and laboratory data in the context of their medical condition is felt to place them at decreased risk for further clinical deterioration. Furthermore, it is anticipated that the patient will be medically stable for discharge from the hospital within 2 midnights of admission.   Author: Jonnie Finner, DO 04/11/2022 1:36 PM  For on call review [email or web address].

## 2022-04-11 NOTE — Telephone Encounter (Signed)
Pt has been seen in ED 

## 2022-04-12 ENCOUNTER — Observation Stay (HOSPITAL_COMMUNITY): Payer: Self-pay

## 2022-04-12 DIAGNOSIS — K7031 Alcoholic cirrhosis of liver with ascites: Secondary | ICD-10-CM

## 2022-04-12 DIAGNOSIS — K7460 Unspecified cirrhosis of liver: Secondary | ICD-10-CM | POA: Diagnosis present

## 2022-04-12 DIAGNOSIS — E440 Moderate protein-calorie malnutrition: Secondary | ICD-10-CM | POA: Insufficient documentation

## 2022-04-12 DIAGNOSIS — R748 Abnormal levels of other serum enzymes: Secondary | ICD-10-CM

## 2022-04-12 LAB — COMPREHENSIVE METABOLIC PANEL
ALT: 230 U/L — ABNORMAL HIGH (ref 0–44)
AST: 387 U/L — ABNORMAL HIGH (ref 15–41)
Albumin: 1.8 g/dL — ABNORMAL LOW (ref 3.5–5.0)
Alkaline Phosphatase: 140 U/L — ABNORMAL HIGH (ref 38–126)
Anion gap: 3 — ABNORMAL LOW (ref 5–15)
BUN: 18 mg/dL (ref 8–23)
CO2: 25 mmol/L (ref 22–32)
Calcium: 7.7 mg/dL — ABNORMAL LOW (ref 8.9–10.3)
Chloride: 109 mmol/L (ref 98–111)
Creatinine, Ser: 0.86 mg/dL (ref 0.44–1.00)
GFR, Estimated: >60 mL/min (ref >60)
Glucose, Bld: 106 mg/dL — ABNORMAL HIGH (ref 70–99)
Potassium: 3.8 mmol/L (ref 3.5–5.1)
Sodium: 137 mmol/L (ref 135–145)
Total Bilirubin: 2 mg/dL — ABNORMAL HIGH (ref 0.3–1.2)
Total Protein: 6.8 g/dL (ref 6.5–8.1)

## 2022-04-12 LAB — ALPHA-1-ANTITRYPSIN: A-1 Antitrypsin, Ser: 168 mg/dL (ref 101–187)

## 2022-04-12 LAB — CBC
HCT: 38.1 % (ref 36.0–46.0)
Hemoglobin: 12.7 g/dL (ref 12.0–15.0)
MCH: 31.3 pg (ref 26.0–34.0)
MCHC: 33.3 g/dL (ref 30.0–36.0)
MCV: 93.8 fL (ref 80.0–100.0)
Platelets: 138 10*3/uL — ABNORMAL LOW (ref 150–400)
RBC: 4.06 MIL/uL (ref 3.87–5.11)
RDW: 16.3 % — ABNORMAL HIGH (ref 11.5–15.5)
WBC: 4.9 10*3/uL (ref 4.0–10.5)
nRBC: 0 % (ref 0.0–0.2)

## 2022-04-12 LAB — BODY FLUID CELL COUNT WITH DIFFERENTIAL
Eos, Fluid: 0 %
Lymphs, Fluid: 64 %
Monocyte-Macrophage-Serous Fluid: 28 % — ABNORMAL LOW (ref 50–90)
Neutrophil Count, Fluid: 8 % (ref 0–25)
Total Nucleated Cell Count, Fluid: 871 cu mm (ref 0–1000)

## 2022-04-12 LAB — PROTIME-INR
INR: 1.4 — ABNORMAL HIGH (ref 0.8–1.2)
Prothrombin Time: 17.3 seconds — ABNORMAL HIGH (ref 11.4–15.2)

## 2022-04-12 LAB — GRAM STAIN

## 2022-04-12 LAB — ALBUMIN, PLEURAL OR PERITONEAL FLUID: Albumin, Fluid: <1.5 g/dL

## 2022-04-12 LAB — ANA: Anti Nuclear Antibody (ANA): NEGATIVE

## 2022-04-12 LAB — CERULOPLASMIN: Ceruloplasmin: 22.9 mg/dL (ref 19.0–39.0)

## 2022-04-12 LAB — AFP TUMOR MARKER: AFP, Serum, Tumor Marker: 11.9 ng/mL — ABNORMAL HIGH (ref 0.0–9.2)

## 2022-04-12 LAB — ANTI-SMOOTH MUSCLE ANTIBODY, IGG: F-Actin IgG: 136 Units — ABNORMAL HIGH (ref 0–19)

## 2022-04-12 LAB — MITOCHONDRIAL ANTIBODIES: Mitochondrial M2 Ab, IgG: <20 Units (ref 0.0–20.0)

## 2022-04-12 MED ORDER — FUROSEMIDE 40 MG PO TABS
40.0000 mg | ORAL_TABLET | Freq: Every day | ORAL | Status: DC
  Administered 2022-04-12 – 2022-04-13 (×2): 40 mg via ORAL
  Filled 2022-04-12 (×2): qty 1

## 2022-04-12 MED ORDER — ENSURE ENLIVE PO LIQD: 237.0000 mL | Freq: Two times a day (BID) | ORAL | Status: DC

## 2022-04-12 MED ORDER — LIDOCAINE HCL 1 % IJ SOLN
INTRAMUSCULAR | Status: AC
  Administered 2022-04-12: 10 mL
  Filled 2022-04-12: qty 20

## 2022-04-12 MED ORDER — SPIRONOLACTONE 100 MG PO TABS
100.0000 mg | ORAL_TABLET | Freq: Every day | ORAL | Status: DC
  Administered 2022-04-12 – 2022-04-13 (×2): 100 mg via ORAL
  Filled 2022-04-12 (×2): qty 1

## 2022-04-12 MED ORDER — TRAMADOL HCL 50 MG PO TABS
50.0000 mg | ORAL_TABLET | Freq: Four times a day (QID) | ORAL | Status: DC | PRN
  Administered 2022-04-12 (×2): 50 mg via ORAL
  Filled 2022-04-12 (×2): qty 1

## 2022-04-12 NOTE — Progress Notes (Addendum)
Initial Nutrition Assessment  DOCUMENTATION CODES:   Non-severe (moderate) malnutrition in context of chronic illness  INTERVENTION:   -Ensure Plus High Protein po BID, each supplement provides 350 kcal and 20 grams of protein.   -Placed "Low Sodium Nutrition therapy" handout in AVS  -Liberalized Heart Healthy diet to 2 gm sodium diet  NUTRITION DIAGNOSIS:   Moderate Malnutrition related to chronic illness, liver cirrhosis as evidenced by energy intake < or equal to 75% for > or equal to 1 month, mild fat depletion.  GOAL:   Patient will meet greater than or equal to 90% of their needs  MONITOR:   PO intake, Supplement acceptance, Labs, Weight trends, I & O's  REASON FOR ASSESSMENT:   Consult Assessment of nutrition requirement/status  ASSESSMENT:   66 y.o. female with medical history significant of COPD, tobacco abuse, EtOH abuse, cirrhosis, HTN, HLD. Presenting with abdominal pain.  Patient in room, states her bloating feels worse today. She is having pain. Still able to eat some, consume 1/2 omelet this morning. States she eats about 1/2 of what she used to before her symptoms developed weeks ago. Pt denies issues with chewing, swallowing or taste. States her smell is starting to come back from having COVID.  Pt with history of daily alcohol consumption for 15-20 years. Now with new diagnosis of liver cirrhosis. We discussed low sodium diet. Will provide handout in AVS.  States she takes a daily MVI at home. Noted that iron and copper levels were checked. Iron elevated. Cerulosplasmin WNL. Pt agreeable to having Vitamin D, B-12, folate checked.  Pt states she has gained ~ 12 lbs lately d/t fluid accumulation. UBW is ~138-139 lbs. Current weight: 152 lbs.  Medications reviewed.  Labs reviewed.  NUTRITION - FOCUSED PHYSICAL EXAM:  Flowsheet Row Most Recent Value  Orbital Region No depletion  Upper Arm Region Mild depletion  Thoracic and Lumbar Region Unable to  assess  [ascites]  Buccal Region Mild depletion  Temple Region No depletion  Clavicle Bone Region Mild depletion  Clavicle and Acromion Bone Region No depletion  Scapular Bone Region No depletion  Dorsal Hand No depletion  Patellar Region Unable to assess  [leggings on under SCDs]  Anterior Thigh Region Unable to assess  Posterior Calf Region Unable to assess  Edema (RD Assessment) --  [ascites]  Hair Reviewed  Eyes Reviewed  Mouth Reviewed  Skin Reviewed       Diet Order:   Diet Order             Diet 2 gram sodium Room service appropriate? Yes; Fluid consistency: Thin  Diet effective now                   EDUCATION NEEDS:   Education needs have been addressed  Skin:  Skin Assessment: Reviewed RN Assessment  Last BM:  7/5  Height:   Ht Readings from Last 1 Encounters:  04/11/22 5\' 7"  (1.702 m)    Weight:   Wt Readings from Last 1 Encounters:  04/11/22 68.9 kg    BMI:  Body mass index is 23.81 kg/m.  Estimated Nutritional Needs:   Kcal:  2100-2300  Protein:  75-90g  Fluid:  2L/day   06/12/22, MS, RD, LDN Inpatient Clinical Dietitian Contact information available via Amion

## 2022-04-12 NOTE — Procedures (Signed)
PROCEDURE SUMMARY:  Successful ultrasound guided paracentesis from the right lower quadrant.  Yielded 3 liters of slightly hazy, yellow fluid.  No immediate complications.  The patient tolerated the procedure well.   Specimen was sent for labs.  EBL < 12mL  If the patient eventually requires >/=2 paracenteses in a 30 day period, screening evaluation by the [facility] will be assessed.   Lysle Morales

## 2022-04-12 NOTE — Progress Notes (Signed)
  Transition of Care [facility]) Screening Note   Patient Details  Name: [name] Date of Birth: May 18, 1956   Transition of Care [facility]) CM/SW Contact:    Amada Jupiter, LCSW Phone Number: 04/12/2022, 1:34 PM    Transition of Care Department [facility]) has reviewed patient and no TOC needs have been identified at this time. We will continue to monitor patient advancement through interdisciplinary progression rounds. If new patient transition needs arise, please place a TOC consult.

## 2022-04-12 NOTE — Progress Notes (Signed)
Angel [name]  XLK:[id number] DOB: August 17, 1956 DOA: 04/11/2022 PCP: Claiborne Rigg, NP    Brief Narrative:  66 year old with a history of COPD, alcohol abuse, cirrhosis of the liver, HTN, and HLD who presented to the ER with abdominal pain which has been worsening over a 3-week.  And associated with distention in the abdomen.  Consultants:  [facility]  Goals of Care:  Code Status: Full Code   DVT prophylaxis: SCDs  Interim Hx: Afebrile.  Vital signs stable since admission.  No acute events reported overnight.  LFTs trending downward.  Tolerated her paracentesis well with 3 L removed.  Reports significant decrease in abdominal discomfort.  Ongoing good appetite.  Denies chest pain or shortness of breath.  Assessment & Plan:  Newly diagnosed cirrhosis of the liver with ascites Discriminant function 25.1 at time of admission -LFTs trending downward since 6/28 ER visit -diuretics initiated -paracentesis completed with fluid studies pending - plan for outpatient EGD for variceal screening - full work-up to rule out other potential etiologies underway - acute viral hepatitis panel negative  Recent Labs  Lab 04/11/22 1158 04/12/22 0455  AST 462* 387*  ALT 287* 230*  ALKPHOS 162* 140*  BILITOT 3.0* 2.0*  PROT 8.4* 6.8  ALBUMIN 2.2* 1.8*    Elevated iron Iron level noted to be modestly elevated at 221 -work-up underway  Moderate Malnutrition related to liver cirrhosis as evidenced by energy intake < or equal to 75% for > or equal to 1 month & mild fat depletion  Thrombocytopenia Due to liver disease/splenic sequestration  COPD Well compensated at present -continue usual home medications  HLD Statin on hold due to elevated LFTs   Family Communication: No family present at time of exam Disposition: From home -anticipate discharge home, possibly as soon as 7/7 if stable overnight with good intake   Objective: Blood pressure 116/67, pulse 81, temperature 98.1 F (36.7 C),  temperature source Oral, resp. rate 16, height 5\' 7"  (1.702 m), weight 68.9 kg, SpO2 92 %.  Intake/Output Summary (Last 24 hours) at 04/12/2022 0804 Last data filed at 04/12/2022 0600 Gross per 24 hour  Intake 600 ml  Output --  Net 600 ml   Filed Weights   04/11/22 1132  Weight: 68.9 kg    Examination: General: No acute respiratory distress Lungs: Clear to auscultation bilaterally without wheezes or crackles Cardiovascular: Regular rate and rhythm without murmur gallop or rub normal S1 and S2 Abdomen: Soft, bowel sounds positive, no ascites at present, no rebound, no appreciable mass Extremities: No significant cyanosis, clubbing, or edema bilateral lower extremities  CBC: Recent Labs  Lab 04/11/22 1158 04/12/22 0455  WBC 7.5 4.9  NEUTROABS 4.4  --   HGB 14.7 12.7  HCT 43.8 38.1  MCV 92.8 93.8  PLT 161 138*   Basic Metabolic Panel: Recent Labs  Lab 04/11/22 1158 04/12/22 0455  NA 137 137  K 3.9 3.8  CL 108 109  CO2 24 25  GLUCOSE 93 106*  BUN 17 18  CREATININE 0.81 0.86  CALCIUM 8.3* 7.7*   GFR: Estimated Creatinine Clearance: 65.1 mL/min (by C-G formula based on SCr of 0.86 mg/dL).  Liver Function Tests: Recent Labs  Lab 04/11/22 1158 04/12/22 0455  AST 462* 387*  ALT 287* 230*  ALKPHOS 162* 140*  BILITOT 3.0* 2.0*  PROT 8.4* 6.8  ALBUMIN 2.2* 1.8*   Recent Labs  Lab 04/11/22 1158  LIPASE 58*    Coagulation Profile: Recent Labs  Lab 04/11/22  1257 04/12/22 0455  INR 1.5* 1.4*    Scheduled Meds:  mometasone-formoterol  2 puff Inhalation BID   multivitamin with minerals   Oral Daily   pantoprazole  40 mg Oral Daily     LOS: 0 days   Lonia Blood, MD [facility] Office  [phone] Pager - Text Page per Loretha Stapler  If 7PM-7AM, please contact night-coverage per Amion 04/12/2022, 8:04 AM

## 2022-04-12 NOTE — Discharge Instructions (Signed)

## 2022-04-12 NOTE — Progress Notes (Signed)
[facility] Gastroenterology Progress Note  SUNNIE ODDEN 66 y.o. 09-26-1956   Subjective: Patient seen and examined lying in bed.  She continues to have abdominal pain.  She is tolerating her diet well.  No bowel movements today.  ROS : Review of Systems  Gastrointestinal:  Positive for abdominal pain. Negative for blood in stool, constipation, diarrhea, heartburn, melena, nausea and vomiting.  Genitourinary:  Negative for dysuria and urgency.    Objective: Vital signs in last 24 hours: Vitals:   04/12/22 0854 04/12/22 0944  BP:  112/62  Pulse:  75  Resp:  18  Temp:  98.2 F (36.8 C)  SpO2: 90% 98%    Physical Exam:  General:  Alert, cooperative, no distress, appears stated age  Head:  Normocephalic, without obvious abnormality, atraumatic  Eyes:  Anicteric sclera, EOM's intact  Lungs:   Clear to auscultation bilaterally, respirations unlabored  Heart:  Regular rate and rhythm, S1, S2 normal  Abdomen:   Soft, non-tender, bowel sounds active all four quadrants,  no masses, mild distended abdomen.   Extremities: Extremities normal, atraumatic, no  edema  Pulses: 2+ and symmetric    Lab Results: Recent Labs    04/11/22 1158 04/12/22 0455  NA 137 137  K 3.9 3.8  CL 108 109  CO2 24 25  GLUCOSE 93 106*  BUN 17 18  CREATININE 0.81 0.86  CALCIUM 8.3* 7.7*   Recent Labs    04/11/22 1158 04/12/22 0455  AST 462* 387*  ALT 287* 230*  ALKPHOS 162* 140*  BILITOT 3.0* 2.0*  PROT 8.4* 6.8  ALBUMIN 2.2* 1.8*   Recent Labs    04/11/22 1158 04/12/22 0455  WBC 7.5 4.9  NEUTROABS 4.4  --   HGB 14.7 12.7  HCT 43.8 38.1  MCV 92.8 93.8  PLT 161 138*   Recent Labs    04/11/22 1257 04/12/22 0455  LABPROT 17.8* 17.3*  INR 1.5* 1.4*      Assessment Cirrhosis with ascites Alcohol abuse   HGB 12.7(14.7) Platelets 138(161) AST 387(462) ALT 230(287)  Alkphos 140(162) TBili 2.0(3.0) GFR >60  INR 04/12/2022 1.4  MELD 3.0: 17 at 04/12/2022  4:55 AM MELD-Na: 13 at  04/12/2022  4:55 AM Calculated from: Serum Creatinine: 0.86 mg/dL (Using min of 1 mg/dL) at 4/0/9811  9:14 AM Serum Sodium: 137 mmol/L at 04/12/2022  4:55 AM Total Bilirubin: 2.0 mg/dL at 04/15/2955  2:13 AM Serum Albumin: 1.8 g/dL at 0/05/6577  4:69 AM INR(ratio): 1.4 at 04/12/2022  4:55 AM Age at listing (hypothetical): 65 years Sex: Female at 04/12/2022  4:55 AM   Right upper quadrant ultrasound 04/04/2022 Changes of cirrhosis.  Perihepatic ascites. Few small layering gravel stones. Mild gallbladder wall thickening. No sonographic Murphy sign. Wall thickening most likely related to liver disease, less likely cholecystitis.   No evidence of hepatic encephalopathy today.  Slight distention of the abdomen and some weight gain possible abdominal ascites.  Patient with previous right upper quadrant ultrasound with perihepatic ascites.  Patient has never undergone EGD or colonoscopy.  Unknown history of esophageal varices. No evidence of hepatocellular carcinoma on recent right upper quadrant ultrasound.  Negative hepatitis panel, iron elevated at 221, ferritin elevated at 353, normal alpha-1 antitrypsin, normal ceruloplasmin, normal ANA, slight increase in AFP at 11.9, no evidence of hepatocellular carcinoma seen right upper quadrant ultrasound. Awaiting results for AMA, ASMA and hereditary hemochromatosis DNA- PCR   Plan: Paracentesis ordered, not yet performed, will evaluate fluid for SBP and SAAG score. Awaiting  results for AMA, ASMA, and hereditary hemochromatosis genetic work-up.  Continue Protonix 40 mg once daily Recommend outpatient EGD for variceal screening and colonoscopy for colon cancer screening. [facility] will follow.  Roseanne Reno Lemya Greenwell PA-C 04/12/2022, 1:19 PM  Contact #  [phone]

## 2022-04-13 ENCOUNTER — Other Ambulatory Visit: Payer: Self-pay

## 2022-04-13 LAB — CBC
HCT: 39.4 % (ref 36.0–46.0)
Hemoglobin: 13.1 g/dL (ref 12.0–15.0)
MCH: 31.6 pg (ref 26.0–34.0)
MCHC: 33.2 g/dL (ref 30.0–36.0)
MCV: 95.2 fL (ref 80.0–100.0)
Platelets: 131 10*3/uL — ABNORMAL LOW (ref 150–400)
RBC: 4.14 MIL/uL (ref 3.87–5.11)
RDW: 16.3 % — ABNORMAL HIGH (ref 11.5–15.5)
WBC: 5.3 10*3/uL (ref 4.0–10.5)
nRBC: 0 % (ref 0.0–0.2)

## 2022-04-13 LAB — COMPREHENSIVE METABOLIC PANEL
ALT: 229 U/L — ABNORMAL HIGH (ref 0–44)
AST: 393 U/L — ABNORMAL HIGH (ref 15–41)
Albumin: 1.9 g/dL — ABNORMAL LOW (ref 3.5–5.0)
Alkaline Phosphatase: 153 U/L — ABNORMAL HIGH (ref 38–126)
Anion gap: 5 (ref 5–15)
BUN: 15 mg/dL (ref 8–23)
CO2: 25 mmol/L (ref 22–32)
Calcium: 7.6 mg/dL — ABNORMAL LOW (ref 8.9–10.3)
Chloride: 104 mmol/L (ref 98–111)
Creatinine, Ser: 0.77 mg/dL (ref 0.44–1.00)
GFR, Estimated: >60 mL/min (ref >60)
Glucose, Bld: 133 mg/dL — ABNORMAL HIGH (ref 70–99)
Potassium: 3.3 mmol/L — ABNORMAL LOW (ref 3.5–5.1)
Sodium: 134 mmol/L — ABNORMAL LOW (ref 135–145)
Total Bilirubin: 1.4 mg/dL — ABNORMAL HIGH (ref 0.3–1.2)
Total Protein: 7.1 g/dL (ref 6.5–8.1)

## 2022-04-13 LAB — PHOSPHORUS: Phosphorus: 3.5 mg/dL (ref 2.5–4.6)

## 2022-04-13 LAB — VITAMIN D 25 HYDROXY (VIT D DEFICIENCY, FRACTURES): Vit D, 25-Hydroxy: 15.92 ng/mL — ABNORMAL LOW (ref 30–100)

## 2022-04-13 LAB — MAGNESIUM: Magnesium: 1.9 mg/dL (ref 1.7–2.4)

## 2022-04-13 LAB — FOLATE: Folate: 15.5 ng/mL (ref >5.9)

## 2022-04-13 LAB — VITAMIN B12: Vitamin B-12: 495 pg/mL (ref 180–914)

## 2022-04-13 LAB — PREALBUMIN: Prealbumin: 5.7 mg/dL — ABNORMAL LOW (ref 18–38)

## 2022-04-13 MED ORDER — SPIRONOLACTONE 100 MG PO TABS
100.0000 mg | ORAL_TABLET | Freq: Every day | ORAL | 0 refills | Status: DC
  Filled 2022-04-13: qty 30, 30d supply, fill #0

## 2022-04-13 MED ORDER — SULFAMETHOXAZOLE-TRIMETHOPRIM 800-160 MG PO TABS
1.0000 | ORAL_TABLET | Freq: Two times a day (BID) | ORAL | Status: DC
  Administered 2022-04-13: 1 via ORAL
  Filled 2022-04-13 (×2): qty 1

## 2022-04-13 MED ORDER — SULFAMETHOXAZOLE-TRIMETHOPRIM 800-160 MG PO TABS
1.0000 | ORAL_TABLET | Freq: Two times a day (BID) | ORAL | 0 refills | Status: AC
  Filled 2022-04-13: qty 28, 14d supply, fill #0

## 2022-04-13 MED ORDER — FUROSEMIDE 40 MG PO TABS
40.0000 mg | ORAL_TABLET | Freq: Every day | ORAL | 0 refills | Status: DC
  Filled 2022-04-13: qty 30, 30d supply, fill #0

## 2022-04-13 MED ORDER — LACTULOSE 10 G PO PACK
10.0000 g | PACK | Freq: Three times a day (TID) | ORAL | 0 refills | Status: DC | PRN
  Filled 2022-04-13: qty 30, fill #0

## 2022-04-13 MED ORDER — POTASSIUM CHLORIDE CRYS ER 20 MEQ PO TBCR
40.0000 meq | EXTENDED_RELEASE_TABLET | Freq: Once | ORAL | Status: AC
  Administered 2022-04-13: 40 meq via ORAL
  Filled 2022-04-13: qty 2

## 2022-04-13 MED ORDER — CYANOCOBALAMIN 1000 MCG/ML IJ SOLN
1000.0000 ug | Freq: Once | INTRAMUSCULAR | Status: AC
Start: 2022-04-13 — End: 2022-04-13
  Administered 2022-04-13: 1000 ug via SUBCUTANEOUS
  Filled 2022-04-13: qty 1

## 2022-04-13 NOTE — Progress Notes (Signed)
[facility] Gastroenterology Progress Note  Angel [name] 66 y.o. Mar 23, 1956   Subjective: Patient seen and examined lying in bed.  Patient notes her pain is much improved today.  Tolerating regular diet well.  ROS : Review of Systems  Gastrointestinal:  Negative for abdominal pain, blood in stool, constipation, diarrhea, heartburn, melena, nausea and vomiting.  Genitourinary:  Negative for dysuria and urgency.  '   Objective: Vital signs in last 24 hours: Vitals:   04/13/22 0516 04/13/22 0801  BP: 121/75   Pulse: 74   Resp: 18   Temp: 97.8 F (36.6 C)   SpO2: 94% 93%    Physical Exam:  General:  Alert, cooperative, no distress, appears stated age  Head:  Normocephalic, without obvious abnormality, atraumatic  Eyes:  Anicteric sclera, EOM's intact  Lungs:   Clear to auscultation bilaterally, respirations unlabored  Heart:  Regular rate and rhythm, S1, S2 normal  Abdomen:   Soft, non-distended, non-tender, bowel sounds active all four quadrants,  no masses,   Extremities: Extremities normal, atraumatic, no  edema  Pulses: 2+ and symmetric    Lab Results: Recent Labs    04/12/22 0455 04/13/22 0511  NA 137 134*  K 3.8 3.3*  CL 109 104  CO2 25 25  GLUCOSE 106* 133*  BUN 18 15  CREATININE 0.86 0.77  CALCIUM 7.7* 7.6*  MG  --  1.9  PHOS  --  3.5   Recent Labs    04/12/22 0455 04/13/22 0511  AST 387* 393*  ALT 230* 229*  ALKPHOS 140* 153*  BILITOT 2.0* 1.4*  PROT 6.8 7.1  ALBUMIN 1.8* 1.9*   Recent Labs    04/11/22 1158 04/12/22 0455 04/13/22 0511  WBC 7.5 4.9 5.3  NEUTROABS 4.4  --   --   HGB 14.7 12.7 13.1  HCT 43.8 38.1 39.4  MCV 92.8 93.8 95.2  PLT 161 138* 131*   Recent Labs    04/11/22 1257 04/12/22 0455  LABPROT 17.8* 17.3*  INR 1.5* 1.4*      Assessment Cirrhosis with ascites Alcohol abuse   HGB 13.1(12.7) Platelets 131(138) AST 393(387) ALT 229(230)  Alkphos 153(140) TBili 1.4(2.0) GFR >60  INR 04/12/2022 1.4  MELD 3.0: 17 at  04/13/2022  5:11 AM MELD-Na: 11 at 04/13/2022  5:11 AM Calculated from: Serum Creatinine: 0.77 mg/dL (Using min of 1 mg/dL) at 01/06/6605  3:01 AM Serum Sodium: 134 mmol/L at 04/13/2022  5:11 AM Total Bilirubin: 1.4 mg/dL at 6/0/1093  2:35 AM Serum Albumin: 1.9 g/dL at 02/12/3219  2:54 AM INR(ratio): 1.4 at 04/12/2022  4:55 AM Age at listing (hypothetical): 65 years Sex: Female at 04/13/2022  5:11 AM   Right upper quadrant ultrasound 04/04/2022 Changes of cirrhosis.  Perihepatic ascites. Few small layering gravel stones. Mild gallbladder wall thickening. No sonographic Murphy sign. Wall thickening most likely related to liver disease, less likely cholecystitis.  Paracentesis 04/12/2022 3 L of slightly hazy yellow fluid removed.  No SBP. SAAG not able to be calculated.  Ascitic fluid albumin less than 1.5.  Patient at increased risk for developing SBP.  Would benefit from empiric antibiotic therapy.   No evidence of hepatic encephalopathy today. Patient has never undergone EGD or colonoscopy.  Unknown history of esophageal varices.  Negative hepatitis panel, iron elevated at 221, ferritin elevated at 353, normal alpha-1 antitrypsin, normal ceruloplasmin, normal ANA, normal AMA,  Increased ASMA, she may need liver biopsy for autoimmune hepatitis outpatient.  Increase in AFP at 11.9, no evidence of  hepatocellular carcinoma seen right upper quadrant ultrasound. Awaiting hereditary hemochromatosis DNA- PCR.     Plan: Continue furosemide 40 mg once daily and spironolactone 100 mg once daily. Will need outpatient follow-up to schedule EGD for variceal screening and colonoscopy for colon cancer screening. Patient may need repeat ASMA or liver biopsy for autoimmune hepatitis as outpatient. Awaiting results for HFE gene.  [facility] will sign off.   Roseanne Reno Nilson Tabora PA-C 04/13/2022, 12:50 PM  Contact #  [phone]

## 2022-04-13 NOTE — Progress Notes (Signed)
Reviewed written d/c instructions w pt and all questions answered. She verbalized understanding. D/C via w/c to d/c lounge in stable condition w all belongings.

## 2022-04-13 NOTE — Progress Notes (Incomplete)
Angel [name]  NWG:[id number] DOB: 14-Jun-1956 DOA: 04/11/2022 PCP: Claiborne Rigg, NP    Brief Narrative:  66 year old with a history of COPD, alcohol abuse, cirrhosis of the liver, HTN, and HLD who presented to the ER with abdominal pain which has been worsening over a 3-week.  And associated with distention in the abdomen.  Consultants:  [facility]  Goals of Care:  Code Status: Full Code   DVT prophylaxis: SCDs  Interim Hx: No acute events reported overnight.  Vital signs stable.  Afebrile.  No evidence of SBP on ascites fluid analysis.  Assessment & Plan:  Newly diagnosed cirrhosis of the liver with ascites No evidence of SBP on ascites fluid testing status post paracentesis removing 3 L - diuretics initiated - plan for outpatient EGD for variceal screening - full work-up to rule out other potential etiologies underway with some serologic studies pending - acute viral hepatitis panel negative  Elevated iron Iron level noted to be modestly elevated at 221 -work-up underway  Elevated F-Actin IgG ?autoimmune hepatitis - GI following   Moderate Malnutrition related to liver cirrhosis as evidenced by energy intake < or equal to 75% for > or equal to 1 month & mild fat depletion  Thrombocytopenia Due to liver disease/splenic sequestration  COPD Well compensated at present -continue usual home medications  HLD Statin on hold due to elevated LFTs   Family Communication: No family present at time of exam Disposition: From home -anticipate discharge home, possibly as soon as 7/7 if stable overnight with good intake   Objective: Blood pressure 121/75, pulse 74, temperature 97.8 F (36.6 C), temperature source Oral, resp. rate 18, height 5\' 7"  (1.702 m), weight 68.9 kg, SpO2 93 %.  Intake/Output Summary (Last 24 hours) at 04/13/2022 0807 Last data filed at 04/13/2022 0600 Gross per 24 hour  Intake 960 ml  Output 0 ml  Net 960 ml    Filed Weights   04/11/22 1132  Weight:  68.9 kg    Examination: General: No acute respiratory distress Lungs: Clear to auscultation bilaterally without wheezes or crackles Cardiovascular: Regular rate and rhythm without murmur gallop or rub normal S1 and S2 Abdomen: Soft, bowel sounds positive, no ascites at present, no rebound, no appreciable mass Extremities: No significant cyanosis, clubbing, or edema bilateral lower extremities  CBC: Recent Labs  Lab 04/11/22 1158 04/12/22 0455 04/13/22 0511  WBC 7.5 4.9 5.3  NEUTROABS 4.4  --   --   HGB 14.7 12.7 13.1  HCT 43.8 38.1 39.4  MCV 92.8 93.8 95.2  PLT 161 138* 131*    Basic Metabolic Panel: Recent Labs  Lab 04/11/22 1158 04/12/22 0455 04/13/22 0511  NA 137 137 134*  K 3.9 3.8 3.3*  CL 108 109 104  CO2 24 25 25   GLUCOSE 93 106* 133*  BUN 17 18 15   CREATININE 0.81 0.86 0.77  CALCIUM 8.3* 7.7* 7.6*  MG  --   --  1.9  PHOS  --   --  3.5    GFR: Estimated Creatinine Clearance: 70 mL/min (by C-G formula based on SCr of 0.77 mg/dL).  Liver Function Tests: Recent Labs  Lab 04/11/22 1158 04/12/22 0455 04/13/22 0511  AST 462* 387* 393*  ALT 287* 230* 229*  ALKPHOS 162* 140* 153*  BILITOT 3.0* 2.0* 1.4*  PROT 8.4* 6.8 7.1  ALBUMIN 2.2* 1.8* 1.9*    Recent Labs  Lab 04/11/22 1158  LIPASE 58*     Coagulation Profile: Recent Labs  Lab 04/11/22 1257 04/12/22 0455  INR 1.5* 1.4*     Scheduled Meds:  feeding supplement  237 mL Oral BID BM   furosemide  40 mg Oral Daily   mometasone-formoterol  2 puff Inhalation BID   multivitamin with minerals   Oral Daily   pantoprazole  40 mg Oral Daily   spironolactone  100 mg Oral Daily     LOS: 1 day   Lonia Blood, MD [facility] Office  [phone] Pager - Text Page per Loretha Stapler  If 7PM-7AM, please contact night-coverage per Amion 04/13/2022, 8:07 AM

## 2022-04-13 NOTE — Discharge Summary (Signed)
DISCHARGE SUMMARY  Angel [name]  MR#: [id number]  DOB:[birth year]  Date of Admission: 04/11/2022 Date of Discharge: 04/13/2022  Attending Physician:Chamberlain Steinborn Silvestre Gunner, MD  Patient's AYT:[id number], Angel Stakes, NP  Consults: [facility]   Disposition: D/C home   Follow-up Appts:  Follow-up Information     Claiborne Rigg, NP Follow up in 1 week(s).   Specialty: Nurse Practitioner Contact information: [address] [place] [address] [phone]         Kerin Salen, MD. Call.   Specialty: Gastroenterology Contact information: [address] [place] [address] [phone]                 Tests Needing Follow-up: -Check electrolytes with patient newly placed on Lasix and Aldactone -Assure patient continues to abstain from alcohol -Follow-up pending results from serologic cirrhosis work-up  Discharge Diagnoses: Newly diagnosed cirrhosis of the liver with ascites Elevated iron Elevated F-Actin IgG Moderate Malnutrition related to liver cirrhosis Thrombocytopenia COPD HLD  Initial presentation: 66 year old with a history of COPD, alcohol abuse, cirrhosis of the liver, HTN, and HLD who presented to the ER with abdominal pain which has been worsening over a 3-week.  And associated with distention in the abdomen.  Hospital Course:  Newly diagnosed cirrhosis of the liver with ascites No evidence of SBP on ascites fluid testing status post paracentesis removing 3 L - diuretics initiated - plan for outpatient EGD for variceal screening - full work-up to rule out other potential etiologies underway with some serologic studies pending at time of d/c - will be addressed in f/u w/ GI service - acute viral hepatitis panel negative -patient has been counseled numerous times as to the absolute need to fully abstain from alcohol permanently   Elevated iron Iron level noted to be modestly elevated at 221 -work-up underway -for outpatient follow-up in GI  office   Elevated F-Actin IgG ?autoimmune hepatitis - GI following    Moderate Malnutrition related to liver cirrhosis as evidenced by energy intake < or equal to 75% for > or equal to 1 month & mild fat depletion   Thrombocytopenia Due to liver disease/splenic sequestration -platelet count stable with no evidence of spontaneous bleeding.  His hospital stay  Alcohol abuse Patient counseled at great length about the absolute need to avoid alcohol completely going forward and the resumption of alcohol abuse would lead to liver failure and death   COPD Well compensated at present -continue usual home medications   HLD Statin on hold due to elevated LFTs     Allergies as of 04/13/2022       Reactions   Penicillins Hives        Medication List     STOP taking these medications    amLODipine 5 MG tablet Commonly known as: NORVASC   atorvastatin 20 MG tablet Commonly known as: LIPITOR       TAKE these medications    albuterol 108 (90 Base) MCG/ACT inhaler Commonly known as: VENTOLIN HFA Inhale 2 puffs into the lungs every 6 (six) hours as needed for wheezing or shortness of breath. What changed:  when to take this reasons to take this   ASPERCREME EX Apply 1 Application topically See admin instructions. 1 application 3-4 times a day as needed for joint pain   CENTRUM SILVER PO Take 1 tablet by mouth daily.   Fluticasone-Salmeterol 250-50 MCG/DOSE Aepb Commonly known as: ADVAIR Inhale 1 puff into the lungs every 12 (twelve) hours.  furosemide 40 MG tablet Commonly known as: LASIX Take 1 tablet (40 mg total) by mouth daily. Start taking on: April 14, 2022   ipratropium-albuterol 0.5-2.5 (3) MG/3ML Soln Commonly known as: DUONEB Take 3 mLs by nebulization every 4 (four) hours as needed. What changed: when to take this   lactulose 10 g packet Commonly known as: CEPHULAC Take 1 packet (10 g total) by mouth 3 (three) times daily as needed (constipation).    NON FORMULARY Take 1 tablet by mouth daily. 60 mg THC - 60 mg CBD combo medication   omeprazole 20 MG capsule Commonly known as: PRILOSEC Take 1 capsule (20 mg total) by mouth daily for 14 days.   OVER THE COUNTER MEDICATION Apply 1 application  topically 2 (two) times daily as needed (joint pain). Tiger Balm PO   OVER THE COUNTER MEDICATION Apply 1 Application topically 2 (two) times daily as needed (joint pain). Frankincense oil   spironolactone 100 MG tablet Commonly known as: ALDACTONE Take 1 tablet (100 mg total) by mouth daily. Start taking on: April 14, 2022   sulfamethoxazole-trimethoprim 800-160 MG tablet Commonly known as: BACTRIM DS Take 1 tablet by mouth every 12 (twelve) hours for 14 days.        Day of Discharge BP 121/75 (BP Location: Right Arm)   Pulse 74   Temp 97.8 F (36.6 C) (Oral)   Resp 18   Ht 5\' 7"  (1.702 m)   Wt 68.9 kg   SpO2 93%   BMI 23.81 kg/m   Physical Exam: General: No acute respiratory distress Lungs: Clear to auscultation bilaterally without wheezes or crackles Cardiovascular: Regular rate and rhythm without murmur gallop or rub normal S1 and S2 Abdomen: Mildly tender diffusely with no point tenderness, no mass, no rebound, no ascites at time of discharge Extremities: No significant cyanosis, clubbing, or edema bilateral lower extremities  Basic Metabolic Panel: Recent Labs  Lab 04/11/22 1158 04/12/22 0455 04/13/22 0511  NA 137 137 134*  K 3.9 3.8 3.3*  CL 108 109 104  CO2 24 25 25   GLUCOSE 93 106* 133*  BUN 17 18 15   CREATININE 0.81 0.86 0.77  CALCIUM 8.3* 7.7* 7.6*  MG  --   --  1.9  PHOS  --   --  3.5    Liver Function Tests: Recent Labs  Lab 04/11/22 1158 04/12/22 0455 04/13/22 0511  AST 462* 387* 393*  ALT 287* 230* 229*  ALKPHOS 162* 140* 153*  BILITOT 3.0* 2.0* 1.4*  PROT 8.4* 6.8 7.1  ALBUMIN 2.2* 1.8* 1.9*   Coags: Recent Labs  Lab 04/11/22 1257 04/12/22 0455  INR 1.5* 1.4*    CBC: Recent  Labs  Lab 04/11/22 1158 04/12/22 0455 04/13/22 0511  WBC 7.5 4.9 5.3  NEUTROABS 4.4  --   --   HGB 14.7 12.7 13.1  HCT 43.8 38.1 39.4  MCV 92.8 93.8 95.2  PLT 161 138* 131*     Recent Results (from the past 240 hour(s))  Culture, body fluid w Gram Stain-bottle     Status: None (Preliminary result)   Collection Time: 04/12/22  2:14 PM   Specimen: Peritoneal Washings  Result Value Ref Range Status   Specimen Description PERITONEAL  Final   Special Requests NONE  Final   Culture   Final    NO GROWTH < 24 HOURS Performed at [facility], [address], [place], [facility] [address]    Report Status PENDING  Incomplete  Gram stain  Status: None   Collection Time: 04/12/22  2:14 PM   Specimen: Peritoneal Washings  Result Value Ref Range Status   Specimen Description PERITONEAL  Final   Special Requests NONE  Final   Gram Stain   Final    FEW WBC PRESENT, PREDOMINANTLY MONONUCLEAR NO ORGANISMS SEEN Performed at [facility], [address] [place], [place] [address]    Report Status 04/12/2022 FINAL  Final     Time spent in discharge (includes decision making & examination of pt): 30 minutes  04/13/2022, 1:52 PM   Cherene Altes, MD [facility] Office  [phone]

## 2022-04-16 ENCOUNTER — Other Ambulatory Visit: Payer: Self-pay

## 2022-04-16 ENCOUNTER — Telehealth: Payer: Self-pay

## 2022-04-16 LAB — CYTOLOGY - NON PAP

## 2022-04-16 NOTE — Telephone Encounter (Signed)
Transition Care Management Unsuccessful Follow-up Telephone Call  Date of discharge and from where:  04/13/2022, [facility]  Attempts:  1st Attempt  Reason for unsuccessful TCM follow-up call:  Left voice message # [phone], call back requested

## 2022-04-17 ENCOUNTER — Telehealth: Payer: Self-pay

## 2022-04-17 LAB — CULTURE, BODY FLUID W GRAM STAIN -BOTTLE: Culture: NO GROWTH

## 2022-04-17 NOTE — Telephone Encounter (Signed)
Transition Care Management Follow-up Telephone Call Date of discharge and from where: 04/13/2022, [facility] How have you been since you were released from the hospital? She said today she is feeling much better. Yesterday she experienced some abdominal pain and swelling in her feet.  She is anxious to see Dr Delford Field on 04/19/2022.  Any questions or concerns? Yes- she is uninsured and said she has applied for Medicaid and hopes to be approved shortly.  Items Reviewed: Did the pt receive and understand the discharge instructions provided? Yes  Medications obtained and verified?  She said she has all medications except the lactulose.  The cost was prohibitive and she said that the [facility] is working on getting her another medication that is less expensive.  Other? No  Any new allergies since your discharge? No  Dietary orders reviewed? Yes Do you have support at home? Yes -  lives alone but has family that checks on her regularly   Home Care and Equipment/Supplies: Were home health services ordered? no If so, what is the name of the agency? N/a  Has the agency set up a time to come to the patient's home? not applicable Were any new equipment or medical supplies ordered?  No What is the name of the medical supply agency? N/a Were you able to get the supplies/equipment? no Do you have any questions related to the use of the equipment or supplies? No  Functional Questionnaire: (I = Independent and D = Dependent) ADLs: independent   Follow up appointments reviewed:  PCP Hospital f/u appt confirmed? Yes  Scheduled to see Dr Delford Field- 04/19/2022.  She will then need to re-establish care with a PCP Specialist Hospital f/u appt confirmed?  None scheduled at this time   Are transportation arrangements needed? No  If their condition worsens, is the pt aware to call PCP or go to the Emergency Dept.? Yes Was the patient provided with contact information for the PCP's office or  ED? Yes Was to pt encouraged to call back with questions or concerns? Yes

## 2022-04-18 LAB — HEMOCHROMATOSIS DNA-PCR(C282Y,H63D)

## 2022-04-19 ENCOUNTER — Other Ambulatory Visit: Payer: Self-pay | Admitting: Pharmacist

## 2022-04-19 ENCOUNTER — Other Ambulatory Visit: Payer: Self-pay

## 2022-04-19 ENCOUNTER — Encounter: Payer: Self-pay | Admitting: Critical Care Medicine

## 2022-04-19 ENCOUNTER — Ambulatory Visit: Payer: Self-pay | Attending: Critical Care Medicine | Admitting: Critical Care Medicine

## 2022-04-19 VITALS — BP 116/69 | HR 78 | Temp 98.4°F | Ht 67.0 in | Wt 135.6 lb

## 2022-04-19 DIAGNOSIS — I700 Atherosclerosis of aorta: Secondary | ICD-10-CM

## 2022-04-19 DIAGNOSIS — E8809 Other disorders of plasma-protein metabolism, not elsewhere classified: Secondary | ICD-10-CM

## 2022-04-19 DIAGNOSIS — I10 Essential (primary) hypertension: Secondary | ICD-10-CM

## 2022-04-19 DIAGNOSIS — Z789 Other specified health status: Secondary | ICD-10-CM

## 2022-04-19 DIAGNOSIS — F17200 Nicotine dependence, unspecified, uncomplicated: Secondary | ICD-10-CM

## 2022-04-19 DIAGNOSIS — Z139 Encounter for screening, unspecified: Secondary | ICD-10-CM

## 2022-04-19 DIAGNOSIS — L03116 Cellulitis of left lower limb: Secondary | ICD-10-CM

## 2022-04-19 DIAGNOSIS — F1090 Alcohol use, unspecified, uncomplicated: Secondary | ICD-10-CM

## 2022-04-19 DIAGNOSIS — R1011 Right upper quadrant pain: Secondary | ICD-10-CM

## 2022-04-19 DIAGNOSIS — Z9109 Other allergy status, other than to drugs and biological substances: Secondary | ICD-10-CM

## 2022-04-19 DIAGNOSIS — Z1329 Encounter for screening for other suspected endocrine disorder: Secondary | ICD-10-CM

## 2022-04-19 DIAGNOSIS — E559 Vitamin D deficiency, unspecified: Secondary | ICD-10-CM

## 2022-04-19 DIAGNOSIS — K7031 Alcoholic cirrhosis of liver with ascites: Secondary | ICD-10-CM

## 2022-04-19 DIAGNOSIS — E785 Hyperlipidemia, unspecified: Secondary | ICD-10-CM

## 2022-04-19 DIAGNOSIS — J479 Bronchiectasis, uncomplicated: Secondary | ICD-10-CM

## 2022-04-19 DIAGNOSIS — K7469 Other cirrhosis of liver: Secondary | ICD-10-CM

## 2022-04-19 DIAGNOSIS — J441 Chronic obstructive pulmonary disease with (acute) exacerbation: Secondary | ICD-10-CM

## 2022-04-19 DIAGNOSIS — E440 Moderate protein-calorie malnutrition: Secondary | ICD-10-CM

## 2022-04-19 HISTORY — DX: Alcohol use, unspecified, uncomplicated: F10.90

## 2022-04-19 HISTORY — DX: Other specified health status: Z78.9

## 2022-04-19 MED ORDER — FLUTICASONE-SALMETEROL 250-50 MCG/ACT IN AEPB
1.0000 | INHALATION_SPRAY | Freq: Two times a day (BID) | RESPIRATORY_TRACT | 6 refills | Status: DC
  Filled 2022-04-19: qty 1, 1d supply, fill #0

## 2022-04-19 MED ORDER — DOXYCYCLINE HYCLATE 100 MG PO TABS
100.0000 mg | ORAL_TABLET | Freq: Two times a day (BID) | ORAL | 0 refills | Status: DC
  Filled 2022-04-19: qty 14, 7d supply, fill #0

## 2022-04-19 MED ORDER — THIAMINE HCL 100 MG PO TABS
100.0000 mg | ORAL_TABLET | Freq: Every day | ORAL | 2 refills | Status: DC
  Filled 2022-04-19: qty 30, 30d supply, fill #0
  Filled 2022-08-09: qty 100, 100d supply, fill #0
  Filled 2022-12-26 – 2023-01-03 (×2): qty 100, 100d supply, fill #1

## 2022-04-19 MED ORDER — ALBUTEROL SULFATE HFA 108 (90 BASE) MCG/ACT IN AERS
2.0000 | INHALATION_SPRAY | Freq: Four times a day (QID) | RESPIRATORY_TRACT | 1 refills | Status: DC | PRN
  Filled 2022-04-19: qty 18, 25d supply, fill #0
  Filled 2022-06-05: qty 18, 17d supply, fill #0

## 2022-04-19 MED ORDER — LACTULOSE 10 GM/15ML PO SOLN
10.0000 g | Freq: Three times a day (TID) | ORAL | 2 refills | Status: DC
  Filled 2022-04-19: qty 1419, 31d supply, fill #0
  Filled 2022-05-15: qty 1419, 31d supply, fill #1
  Filled 2022-06-21: qty 1419, 31d supply, fill #2

## 2022-04-19 MED ORDER — SPIRONOLACTONE 100 MG PO TABS
100.0000 mg | ORAL_TABLET | Freq: Every day | ORAL | 3 refills | Status: DC
  Filled 2022-04-19 – 2022-05-15 (×3): qty 30, 30d supply, fill #0
  Filled 2022-06-14: qty 30, 30d supply, fill #1
  Filled 2022-07-16: qty 30, 30d supply, fill #2
  Filled 2022-08-14: qty 30, 30d supply, fill #3

## 2022-04-19 MED ORDER — OMEPRAZOLE 20 MG PO CPDR
20.0000 mg | DELAYED_RELEASE_CAPSULE | Freq: Every day | ORAL | 2 refills | Status: DC
  Filled 2022-04-19: qty 30, 30d supply, fill #0
  Filled 2022-06-05: qty 60, 60d supply, fill #0
  Filled 2022-07-16 – 2022-07-27 (×2): qty 30, 30d supply, fill #1
  Filled 2022-07-27: qty 60, 60d supply, fill #1
  Filled 2022-07-27: qty 30, 30d supply, fill #1
  Filled 2022-08-28: qty 30, 30d supply, fill #2

## 2022-04-19 MED ORDER — FLUTICASONE FUROATE-VILANTEROL 100-25 MCG/ACT IN AEPB
1.0000 | INHALATION_SPRAY | Freq: Every day | RESPIRATORY_TRACT | 11 refills | Status: DC
  Filled 2022-04-19: qty 60, 30d supply, fill #0

## 2022-04-19 MED ORDER — FOLIC ACID 1 MG PO TABS
1.0000 mg | ORAL_TABLET | Freq: Every day | ORAL | 1 refills | Status: DC
  Filled 2022-04-19: qty 30, 30d supply, fill #0
  Filled 2022-05-15: qty 30, 30d supply, fill #1

## 2022-04-19 MED ORDER — VITAMIN D (ERGOCALCIFEROL) 1.25 MG (50000 UNIT) PO CAPS
50000.0000 [IU] | ORAL_CAPSULE | ORAL | 5 refills | Status: DC
  Filled 2022-04-19: qty 5, 35d supply, fill #0
  Filled 2022-05-17: qty 5, 35d supply, fill #1
  Filled 2022-06-21: qty 5, 35d supply, fill #2
  Filled 2022-07-27: qty 5, 35d supply, fill #3
  Filled 2022-08-28: qty 5, 35d supply, fill #4

## 2022-04-19 MED ORDER — IPRATROPIUM-ALBUTEROL 0.5-2.5 (3) MG/3ML IN SOLN
3.0000 mL | Freq: Four times a day (QID) | RESPIRATORY_TRACT | 0 refills | Status: DC | PRN
  Filled 2022-04-19: qty 360, 30d supply, fill #0

## 2022-04-19 NOTE — Assessment & Plan Note (Signed)
Cont zyrtec

## 2022-04-19 NOTE — Assessment & Plan Note (Addendum)
As per cirrhosis eval Ascites no growth on cultures

## 2022-04-19 NOTE — Assessment & Plan Note (Signed)
Amlodipine held following recent hospital admission, and furosemide and aldactone were started.  Will hold furosemide at this time as she is not fluid overloaded and BP at 116/69 today.

## 2022-04-19 NOTE — Assessment & Plan Note (Signed)
Nutrition supp advised

## 2022-04-19 NOTE — Assessment & Plan Note (Signed)
Involving dorsum of left foot   Complete bactrim until 7/21 and re eval

## 2022-04-19 NOTE — Progress Notes (Deleted)
Established Patient Office Visit  Subjective   Patient ID: Angel [name], female    DOB: [birth year]  Age: 66 y.o. MRN: [id number]  No chief complaint on file.   PCP Meredeth Ide   not seen by PCP since 2020  [facility] Hosp TOC OV RN TOC call occurred 04/17/22  HTN asthma copd   DOB:1956/01/15  Date of Admission: 04/11/2022 Date of Discharge: 04/13/2022   Attending Physician:Jeffrey Silvestre Gunner, MD   Patient's UYQ:[id number], [name], NP   Consults: [facility]    Disposition: D/C home    Follow-up Appts:    Follow-up Information    Claiborne Rigg, NP Follow up in 1 week(s).  Specialty: Nurse Practitioner Contact information: [address] [place] [address] [phone]    Kerin Salen, MD. Call.  Specialty: Gastroenterology Contact information: [address] [place] [address] [phone]  Tests Needing Follow-up: -Check electrolytes with patient newly placed on Lasix and Aldactone -Assure patient continues to abstain from alcohol -Follow-up pending results from serologic cirrhosis work-up   Discharge Diagnoses: Newly diagnosed cirrhosis of the liver with ascites Elevated iron Elevated F-Actin IgG Moderate Malnutrition related to liver cirrhosis Thrombocytopenia COPD HLD   Initial presentation: 66 year old with a history of COPD, alcohol abuse, cirrhosis of the liver, HTN, and HLD who presented to the ER with abdominal pain which has been worsening over a 3-week.  And associated with distention in the abdomen.   Hospital Course:   Newly diagnosed cirrhosis of the liver with ascites No evidence of SBP on ascites fluid testing status post paracentesis removing 3 L - diuretics initiated - plan for outpatient EGD for variceal screening - full work-up to rule out other potential etiologies underway with some serologic studies pending at time of d/c - will be addressed in f/u w/ GI service - acute viral hepatitis panel negative -patient has been counseled  numerous times as to the absolute need to fully abstain from alcohol permanently   Elevated iron Iron level noted to be modestly elevated at 221 -work-up underway -for outpatient follow-up in GI office   Elevated F-Actin IgG ?autoimmune hepatitis - GI following    Moderate Malnutrition related to liver cirrhosis as evidenced by energy intake < or equal to 75% for > or equal to 1 month & mild fat depletion   Thrombocytopenia Due to liver disease/splenic sequestration -platelet count stable with no evidence of spontaneous bleeding.  His hospital stay   Alcohol abuse Patient counseled at great length about the absolute need to avoid alcohol completely going forward and the resumption of alcohol abuse would lead to liver failure and death   COPD Well compensated at present -continue usual home medications   HLD Statin on hold due to elevated LFTs     {History (Optional):23778}  Review of Systems  Constitutional:  Positive for weight loss.       Anorexia  HENT:  Negative for congestion, ear discharge, ear pain, nosebleeds and sinus pain.   Eyes:  Negative for photophobia.  Respiratory:  Positive for cough and shortness of breath. Negative for hemoptysis, sputum production and wheezing.   Cardiovascular:  Positive for claudication and leg swelling. Negative for chest pain and orthopnea.  Gastrointestinal:  Positive for abdominal pain, constipation and nausea. Negative for blood in stool, diarrhea, heartburn and melena.  Genitourinary: Negative.   Musculoskeletal:  Positive for joint pain and myalgias.  Neurological:  Negative for headaches.  Psychiatric/Behavioral:  The patient is nervous/anxious and has  insomnia.       Objective:     There were no vitals taken for this visit. {Vitals History (Optional):23777}  Physical Exam   No results found for any visits on 04/19/22.  {Labs (Optional):23779}  The 10-year ASCVD risk score (Arnett DK, et al., 2019) is: 11.4%     Assessment & Plan:   Problem List Items Addressed This Visit   None   No follow-ups on file.    Shan Levans, MD

## 2022-04-19 NOTE — Assessment & Plan Note (Signed)
Elevated iron on hospital labs (353) Iron, TIBC and Ferritin panel ordered today  Has follow up with GI

## 2022-04-19 NOTE — Progress Notes (Signed)
Cramping in feet and hands.

## 2022-04-19 NOTE — Assessment & Plan Note (Signed)
Hemoglobin A1c, CBC with differential/platelet, complete metabolic panel,  thyroid panel with TSH, mammogram ordered today

## 2022-04-19 NOTE — Assessment & Plan Note (Signed)
Cont advair for now  Smoking cessation advised

## 2022-04-19 NOTE — Assessment & Plan Note (Signed)
Most recent prealbumin= 5.7.Repeat lab ordered today.  Nutrition handout discussed.

## 2022-04-19 NOTE — Assessment & Plan Note (Signed)
Statin held at this time. Lipid panel ordered. Diet handout emphasized greatly to patient as she is not on a cholesterol medication at this time given her recent history.

## 2022-04-19 NOTE — Assessment & Plan Note (Signed)
As per copd

## 2022-04-19 NOTE — Assessment & Plan Note (Addendum)
Statin held at this time due to liver function. Explained to patient that we will eventually restart a cholesterol medication once we have a handle on cirrhosis treatment.  Repeat lipid labs ordered  Diet and lifestyle handout emphasized  The following Lifestyle Medicine recommendations according to American College of Lifestyle Medicine [facility]) were discussed and offered to patient who agrees to start the journey:  A. Whole Foods, Plant-based plate comprising of fruits and vegetables, plant-based proteins, whole-grain carbohydrates was discussed in detail with the patient.   A list for source of those nutrients were also provided to the patient.  Patient will use only water or unsweetened tea for hydration. B.  The need to stay away from risky substances including alcohol, smoking; obtaining 7 to 9 hours of restorative sleep, at least 150 minutes of moderate intensity exercise weekly, the importance of healthy social connections,  and stress reduction techniques were discussed.

## 2022-04-19 NOTE — Assessment & Plan Note (Addendum)
New diagnosis following most recent hospital admission. Labs show AST=393, ALT=229, Alk Phos= 57. Patient with no prior knowledge of liver damage. US abdomen reviewed. Lactulose was to be started but patient was unable to obtain. Lactulose sent to pharmacy today.  Labs also revealed Elevated F-Actin IgG, and elevated serum iron.  Need to r/o hemachromatosis Hep A, B C all neg  She is to see GI for EGD and further workup   Thiamine and Folic acid started once daily

## 2022-04-19 NOTE — Patient Instructions (Addendum)
Refills on all medications sent to our pharmacy downstairs  We have sent lactulose she will take that 3 times daily as prescribed we sent a version that is not expensive to our pharmacy  Keep your gastroenterology neurology visit that is upcoming  Finish your course of Bactrim this will treat the cellulitis in your foot along with anything else that is occurring  Complete set of screening labs were obtained at this visit  Stop furosemide  Stay on Aldactone  You are low in vitamin D take vitamin D weekly  Stop all CBD products  Stay on a multivitamin  Folic acid and thiamine was sent to the pharmacy downstairs to help with your liver disease  Focus on smoking cessation  Return to see Dr. Delford Field 6 weeks

## 2022-04-19 NOTE — Assessment & Plan Note (Signed)
RUQ pain and LLQ pain with palpation. Lipase ordered. Has upcoming GI appt.

## 2022-04-19 NOTE — Assessment & Plan Note (Signed)
Patient reports she is doing well on duonebulizer, albuterol and advair Refills sent today

## 2022-04-19 NOTE — Assessment & Plan Note (Signed)
  .   Current smoking consumption amount: 1/2 ppd  Dicsussion on advise to quit smoking and smoking impacts: cv lung impacts  Patient's willingness to quit:  May quit  Methods to quit smoking discussed:  behav mod, nicotine replacement lozenge  Medication management of smoking session drugs discussed:nicotine replacement  Resources provided:  AVS   Setting quit date not established  Follow-up arranged 1 mo   Time spent counseling the patient:  5 min  

## 2022-04-19 NOTE — Assessment & Plan Note (Signed)
Advised abstinence  Pt currently alcohol free Exploring other causes of liver disease

## 2022-04-19 NOTE — Progress Notes (Signed)
Established Patient Office Visit  Subjective   Patient ID: Angel [name], female    DOB: 1955/12/27  Age: 66 y.o. MRN: [id number]  Chief Complaint  Patient presents with   Hospitalization Follow-up    TOC OV d/c is < 7 days from OV , RN TOC OV has occured  Angel [name] is a 66 year old female who presents for follow up after a hospital admission. Patient with history of COPD, hyperlipidemia, hypertension, aortic arthrosclerosis, GERD and tobacco use. She was admitted from 7/05-7/07 for liver cirrhosis with ascites, patient had no prior knowledge of this condition.  Reports she has not drank alcohol in 2 weeks.   While admitted she had 3L of fluid taken off and was started on furesomide and aldactone daily. She was told to discontinue her daily statin and hypertensive medication (amlodipine). Labs also showed a low vitamin D level, and elevated iron. She was referred to [facility] for further workup and EGD. States she has made this appointment.   Currently she is managing her COPD with albuterol, advair and a duonebulizer. She feels like it is well controlled. She is still smoking cigarettes but reports she has cutdown recently as she has a roommate now and is not allowed to.   In addition, she reports a left lower leg/foot pain and rash that began prior to hospital admission. She was started on oral Bactrim following admission and is to continue til 7/21. Also states intermittent cramping of her bilateral lower extremities, worse at night. This has been ongoing for over 6 months per patient. She is unsure of any aggravating or alleviating factors.   Currently Angel Kelso works as a Secretary/administrator but has been unable to due to the recent medical events.   DC summary reviewed.   DOB:30-Mar-1956  Date of Admission: 04/11/2022 Date of Discharge: 04/13/2022   Attending Physician:Jeffrey Hennie Duos, MD   Patient's [id number], Angel Buff, NP   Consults: [facility]    Disposition: D/C home     Follow-up Appts:    Follow-up Information        Angel Pounds, NP Follow up in 1 week(s).  Specialty: Nurse Practitioner Contact information: [place] [address] [phone]               Ronnette Juniper, MD. Call.  Specialty: Gastroenterology Contact information: [place] [address] [phone]                       Tests Needing Follow-up: -Check electrolytes with patient newly placed on Lasix and Aldactone -Assure patient continues to abstain from alcohol -Follow-up pending results from serologic cirrhosis work-up   Discharge Diagnoses: Newly diagnosed cirrhosis of the liver with ascites Elevated iron Elevated F-Actin IgG Moderate Malnutrition related to liver cirrhosis Thrombocytopenia COPD HLD   Initial presentation: 66 year old with a history of COPD, alcohol abuse, cirrhosis of the liver, HTN, and HLD who presented to the ER with abdominal pain which has been worsening over a 3-week.  And associated with distention in the abdomen.   Hospital Course:   Newly diagnosed cirrhosis of the liver with ascites No evidence of SBP on ascites fluid testing status post paracentesis removing 3 L - diuretics initiated - plan for outpatient EGD for variceal screening - full work-up to rule out other potential etiologies underway with some serologic studies pending at time of d/c - will be addressed in f/u w/ GI service -  acute viral hepatitis panel negative -patient has been counseled numerous times as to the absolute need to fully abstain from alcohol permanently   Elevated iron Iron level noted to be modestly elevated at 221 -work-up underway -for outpatient follow-up in GI office   Elevated F-Actin IgG ?autoimmune hepatitis - GI following    Moderate Malnutrition related to liver cirrhosis as evidenced by energy intake < or equal to 75% for > or equal to 1 month & mild fat depletion    Thrombocytopenia Due to liver disease/splenic sequestration -platelet count stable with no evidence of spontaneous bleeding.  His hospital stay   Alcohol abuse Patient counseled at great length about the absolute need to avoid alcohol completely going forward and the resumption of alcohol abuse would lead to liver failure and death   COPD Well compensated at present -continue usual home medications   HLD Statin on hold due to elevated LFTs     Patient Active Problem List   Diagnosis Date Noted   Aortic atherosclerosis ([place]) 04/19/2022   Vitamin D deficiency 04/19/2022   Encounter for health-related screening 04/19/2022   Iron overload 04/19/2022   Cellulitis of left lower extremity 04/19/2022   Alcohol use 04/19/2022   Cirrhosis ([place]) 04/12/2022   Malnutrition of moderate degree 04/12/2022   Abdominal pain 04/11/2022   HTN (hypertension) 04/11/2022   HLD (hyperlipidemia) 04/11/2022   Hypoalbuminemia 27/03/2375   Alcoholic cirrhosis of liver with ascites ([place]) 04/11/2022   Bronchiectasis without acute exacerbation ([place]) 03/06/2014   COPD (chronic obstructive pulmonary disease) ([place]) 02/19/2014   Environmental allergies 02/19/2014   Smoker 12/31/2013   Past Medical History:  Diagnosis Date   Asthma    Bronchiolitis    COPD (chronic obstructive pulmonary disease) ([place])    Hypertension    Past Surgical History:  Procedure Laterality Date   ABDOMINAL HYSTERECTOMY     APPENDECTOMY     BACK SURGERY     Social History   Tobacco Use   Smoking status: Former    Packs/day: 0.50    Years: 30.00    Total pack years: 15.00    Types: Cigarettes    Quit date: 12/30/2013    Years since quitting: 8.3   Smokeless tobacco: Never  Substance Use Topics   Alcohol use: Yes    Alcohol/week: 28.0 standard drinks of alcohol    Types: 28 Glasses of wine per week   Drug use: Yes    Frequency: 3.0 times per week    Types: Marijuana   Family History  Problem Relation Age of  Onset   CAD Father    Heart disease Father    Colon cancer Mother    Breast cancer Sister    Emphysema Paternal Grandfather        never smoker, IT trainer   Allergies  Allergen Reactions   Penicillins Hives      Review of Systems  Constitutional:  Positive for malaise/fatigue. Negative for chills, diaphoresis, fever and weight loss.  HENT:  Negative for congestion, hearing loss, nosebleeds, sore throat and tinnitus.   Eyes:  Negative for blurred vision, photophobia and redness.  Respiratory:  Positive for cough and shortness of breath. Negative for hemoptysis, sputum production, wheezing and stridor.   Cardiovascular:  Positive for claudication and leg swelling. Negative for chest pain, palpitations, orthopnea and PND.  Gastrointestinal:  Positive for abdominal pain, constipation and nausea. Negative for blood in stool, diarrhea, heartburn and vomiting.       Anorexia  Genitourinary:  Negative for dysuria, flank pain, frequency, hematuria and urgency.  Musculoskeletal:  Positive for joint pain. Negative for back pain, falls, myalgias and neck pain.  Skin:  Positive for itching and rash.  Neurological:  Negative for dizziness, tingling, tremors, sensory change, speech change, focal weakness, seizures, loss of consciousness, weakness and headaches.  Endo/Heme/Allergies:  Negative for environmental allergies and polydipsia. Does not bruise/bleed easily.  Psychiatric/Behavioral:  Negative for depression, memory loss, substance abuse and suicidal ideas. The patient is not nervous/anxious and does not have insomnia.   All other systems reviewed and are negative.     Objective:     BP 116/69   Pulse 78   Temp 98.4 F (36.9 C) (Oral)   Ht '5\' 7"'  (1.702 m)   Wt 135 lb 9.6 oz (61.5 kg)   SpO2 94%   BMI 21.24 kg/m    Physical Exam Constitutional:      Appearance: Normal appearance.  HENT:     Head: Normocephalic.     Right Ear: Tympanic membrane, ear canal and external ear  normal.     Left Ear: Tympanic membrane, ear canal and external ear normal.     Nose: Nose normal.     Mouth/Throat:     Mouth: Mucous membranes are moist.     Pharynx: Oropharynx is clear.  Eyes:     Pupils: Pupils are equal, round, and reactive to light.  Neck:     Comments: Prominent tender bilateral cervical lymphadenopathy Cardiovascular:     Rate and Rhythm: Normal rate and regular rhythm.     Pulses: Normal pulses.     Heart sounds: Normal heart sounds.  Pulmonary:     Breath sounds: Wheezing present.  Abdominal:     Palpations: Abdomen is soft.     Tenderness: There is abdominal tenderness (RUQ, LLQ).  Lymphadenopathy:     Cervical: No cervical adenopathy.  Skin:    General: Skin is warm.     Findings: Rash (lower left lateral calf, top of foot) present.  Neurological:     General: No focal deficit present.     Mental Status: She is alert and oriented to person, place, and time.  Psychiatric:        Mood and Affect: Mood normal.        Behavior: Behavior normal.      No results found for any visits on 04/19/22.    The 10-year ASCVD risk score (Arnett DK, et al., 2019) is: 10.5%    Assessment & Plan:   Problem List Items Addressed This Visit       Cardiovascular and Mediastinum   HTN (hypertension)    Amlodipine held following recent hospital admission, and furosemide and aldactone were started.  Will hold furosemide at this time as she is not fluid overloaded and BP at 116/69 today.       Relevant Medications   spironolactone (ALDACTONE) 100 MG tablet   Aortic atherosclerosis (HCC)    Statin held at this time due to liver function. Explained to patient that we will eventually restart a cholesterol medication once we have a handle on cirrhosis treatment.  Repeat lipid labs ordered  Diet and lifestyle handout emphasized  The following Lifestyle Medicine recommendations according to [place] of Lifestyle Medicine [facility]) were discussed and offered  to patient who agrees to start the journey:  A. Whole Foods, Plant-based plate comprising of fruits and vegetables, plant-based proteins, whole-grain carbohydrates was discussed in detail with the patient.   A  list for source of those nutrients were also provided to the patient.  Patient will use only water or unsweetened tea for hydration. B.  The need to stay away from risky substances including alcohol, smoking; obtaining 7 to 9 hours of restorative sleep, at least 150 minutes of moderate intensity exercise weekly, the importance of healthy social connections,  and stress reduction techniques were discussed.        Relevant Medications   spironolactone (ALDACTONE) 100 MG tablet     Respiratory   COPD (chronic obstructive pulmonary disease) (HCC)    Cont advair for now  Smoking cessation advised      Relevant Medications   albuterol (VENTOLIN HFA) 108 (90 Base) MCG/ACT inhaler   ipratropium-albuterol (DUONEB) 0.5-2.5 (3) MG/3ML SOLN   Bronchiectasis without acute exacerbation (HCC)    As per copd         Digestive   Alcoholic cirrhosis of liver with ascites ([place]) - Primary    As per cirrhosis eval Ascites no growth on cultures      Relevant Orders   Comprehensive metabolic panel   CBC with Differential/Platelet   Cirrhosis ([place])    New diagnosis following most recent hospital admission. Labs show AST=393, ALT=229, Alk Phos= 57. Patient with no prior knowledge of liver damage. US abdomen reviewed. Lactulose was to be started but patient was unable to obtain. Lactulose sent to pharmacy today.  Labs also revealed Elevated F-Actin IgG, and elevated serum iron.  Need to r/o hemachromatosis Hep A, B C all neg  She is to see GI for EGD and further workup   Thiamine and Folic acid started once daily       Relevant Orders   Comprehensive metabolic panel   CBC with Differential/Platelet     Other   Smoker       Current smoking consumption amount: 1/2 ppd  Dicsussion on  advise to quit smoking and smoking impacts: cv lung impacts  Patient's willingness to quit:  May quit  Methods to quit smoking discussed:  behav mod, nicotine replacement lozenge  Medication management of smoking session drugs discussed:nicotine replacement  Resources provided:  AVS   Setting quit date not established  Follow-up arranged 1 mo   Time spent counseling the patient:  5 min       Environmental allergies    Cont zyrtec      Abdominal pain    RUQ pain and LLQ pain with palpation. Lipase ordered. Has upcoming GI appt.       Relevant Orders   Lipase   HLD (hyperlipidemia)    Statin held at this time. Lipid panel ordered. Diet handout emphasized greatly to patient as she is not on a cholesterol medication at this time given her recent history.       Relevant Medications   spironolactone (ALDACTONE) 100 MG tablet   Other Relevant Orders   Lipid panel   Hypoalbuminemia    Most recent prealbumin= 5.7.Repeat lab ordered today.  Nutrition handout discussed.       Malnutrition of moderate degree    Nutrition supp advised      Vitamin D deficiency    Most recent value = 15.92.  Vit D 50,000 units weekly started      Encounter for health-related screening    Hemoglobin A1c, CBC with differential/platelet, complete metabolic panel,  thyroid panel with TSH, mammogram ordered today      Relevant Orders   Hemoglobin A1c   Iron overload  Elevated iron on hospital labs (353) Iron, TIBC and Ferritin panel ordered today  Has follow up with GI       Relevant Orders   Iron, TIBC and Ferritin Panel   CBC with Differential/Platelet   Cellulitis of left lower extremity    Involving dorsum of left foot   Complete bactrim until 7/21 and re eval      Alcohol use    Advised abstinence  Pt currently alcohol free Exploring other causes of liver disease      Other Visit Diagnoses     Thyroid disorder screen       Relevant Orders   Thyroid Panel With TSH      Will do mammogram at a later date     Has had total TAH BSO including cervix does not need Pap Will get colonoscopy with GI 45 min spent reviewing old chart History physical and high degree of complexity   Return in about 1 month (around 05/20/2022).    Asencion Noble, MD

## 2022-04-19 NOTE — Assessment & Plan Note (Signed)
Most recent value = 15.92.  Vit D 50,000 units weekly started

## 2022-04-20 ENCOUNTER — Telehealth: Payer: Self-pay

## 2022-04-20 ENCOUNTER — Ambulatory Visit: Payer: Self-pay | Admitting: *Deleted

## 2022-04-20 ENCOUNTER — Other Ambulatory Visit: Payer: Self-pay

## 2022-04-20 LAB — CBC WITH DIFFERENTIAL/PLATELET
Basophils Absolute: 0.1 10*3/uL (ref 0.0–0.2)
Basos: 1 %
EOS (ABSOLUTE): 0.1 10*3/uL (ref 0.0–0.4)
Eos: 1 %
Hematocrit: 41.3 % (ref 34.0–46.6)
Hemoglobin: 14.7 g/dL (ref 11.1–15.9)
Immature Grans (Abs): 0.1 10*3/uL (ref 0.0–0.1)
Immature Granulocytes: 1 %
Lymphocytes Absolute: 1.1 10*3/uL (ref 0.7–3.1)
Lymphs: 19 %
MCH: 31.7 pg (ref 26.6–33.0)
MCHC: 35.6 g/dL (ref 31.5–35.7)
MCV: 89 fL (ref 79–97)
Monocytes Absolute: 0.7 10*3/uL (ref 0.1–0.9)
Monocytes: 12 %
Neutrophils Absolute: 3.9 10*3/uL (ref 1.4–7.0)
Neutrophils: 66 %
Platelets: 190 10*3/uL (ref 150–450)
RBC: 4.64 x10E6/uL (ref 3.77–5.28)
RDW: 13.9 % (ref 11.7–15.4)
WBC: 6 10*3/uL (ref 3.4–10.8)

## 2022-04-20 LAB — COMPREHENSIVE METABOLIC PANEL
ALT: 241 IU/L — ABNORMAL HIGH (ref 0–32)
AST: 365 IU/L — ABNORMAL HIGH (ref 0–40)
Albumin/Globulin Ratio: 0.5 — ABNORMAL LOW (ref 1.2–2.2)
Albumin: 2.9 g/dL — ABNORMAL LOW (ref 3.9–4.9)
Alkaline Phosphatase: 202 IU/L — ABNORMAL HIGH (ref 44–121)
BUN/Creatinine Ratio: 14 (ref 12–28)
BUN: 13 mg/dL (ref 8–27)
Bilirubin Total: 1.5 mg/dL — ABNORMAL HIGH (ref 0.0–1.2)
CO2: 21 mmol/L (ref 20–29)
Calcium: 8.9 mg/dL (ref 8.7–10.3)
Chloride: 101 mmol/L (ref 96–106)
Creatinine, Ser: 0.9 mg/dL (ref 0.57–1.00)
Globulin, Total: 5.9 g/dL — ABNORMAL HIGH (ref 1.5–4.5)
Glucose: 81 mg/dL (ref 70–99)
Potassium: 4.4 mmol/L (ref 3.5–5.2)
Sodium: 134 mmol/L (ref 134–144)
Total Protein: 8.8 g/dL — ABNORMAL HIGH (ref 6.0–8.5)
eGFR: 72 mL/min/{1.73_m2} (ref >59)

## 2022-04-20 LAB — IRON,TIBC AND FERRITIN PANEL
Ferritin: 571 ng/mL — ABNORMAL HIGH (ref 15–150)
Iron Saturation: 72 % — ABNORMAL HIGH (ref 15–55)
Iron: 222 ug/dL — ABNORMAL HIGH (ref 27–139)
Total Iron Binding Capacity: 310 ug/dL (ref 250–450)
UIBC: 88 ug/dL — ABNORMAL LOW (ref 118–369)

## 2022-04-20 LAB — LIPID PANEL
Chol/HDL Ratio: 3.4 ratio (ref 0.0–4.4)
Cholesterol, Total: 121 mg/dL (ref 100–199)
HDL: 36 mg/dL — ABNORMAL LOW (ref >39)
LDL Chol Calc (NIH): 72 mg/dL (ref 0–99)
Triglycerides: 62 mg/dL (ref 0–149)
VLDL Cholesterol Cal: 13 mg/dL (ref 5–40)

## 2022-04-20 LAB — THYROID PANEL WITH TSH
Free Thyroxine Index: 2.7 (ref 1.2–4.9)
T3 Uptake Ratio: 27 % (ref 24–39)
T4, Total: 10.1 ug/dL (ref 4.5–12.0)
TSH: 3.23 u[IU]/mL (ref 0.450–4.500)

## 2022-04-20 LAB — HEMOGLOBIN A1C
Est. average glucose Bld gHb Est-mCnc: 100 mg/dL
Hgb A1c MFr Bld: 5.1 % (ref 4.8–5.6)

## 2022-04-20 LAB — LIPASE: Lipase: 115 U/L — ABNORMAL HIGH (ref 14–72)

## 2022-04-20 NOTE — Progress Notes (Signed)
Let pt know liver still inflammed, keep appt with GI upcoming and stay off all alcohol products and avoid tylenol , iron levels remain high , cholesterol normal, pancreas enzyme still high: GI will f/u on this, blood counts normal, no diabetes, thyroid normal

## 2022-04-20 NOTE — Telephone Encounter (Signed)
  Additional Notes: Pt read on discharge papers from yesterday visit that she is to stop lasix. She did not remember Dr. Delford Field saying this. I confirmed that is correct and advised her to call us if she begins to swell again.  Reason for Disposition  Health Information question, no triage required and triager able to answer question  Answer Assessment - Initial Assessment Questions 1. REASON FOR CALL or QUESTION: "What is your reason for calling today?" or "How can I best help you?" or "What question do you have that I can help answer?"     Question why stopping lasix  Protocols used: Information Only Call - No Triage-A-AH

## 2022-04-20 NOTE — Telephone Encounter (Signed)
Pt was called and is aware of results, DOB was confirmed.  ?

## 2022-04-20 NOTE — Telephone Encounter (Signed)
-----   Message from Storm Frisk, MD sent at 04/20/2022  6:07 AM EDT ----- Let pt know liver still inflammed, keep appt with GI upcoming and stay off all alcohol products and avoid tylenol , iron levels remain high , cholesterol normal, pancreas enzyme still high: GI will f/u on this, blood counts normal, no diabetes, thyroid normal

## 2022-05-02 ENCOUNTER — Inpatient Hospital Stay: Payer: Self-pay | Admitting: Nurse Practitioner

## 2022-05-04 ENCOUNTER — Other Ambulatory Visit: Payer: Self-pay

## 2022-05-07 ENCOUNTER — Other Ambulatory Visit: Payer: Self-pay

## 2022-05-07 MED ORDER — FUROSEMIDE 20 MG PO TABS
ORAL_TABLET | ORAL | 1 refills | Status: DC
  Filled 2022-05-07: qty 30, 30d supply, fill #0

## 2022-05-07 MED ORDER — PREDNISONE 10 MG PO TABS
ORAL_TABLET | ORAL | 2 refills | Status: DC
  Filled 2022-05-07: qty 30, 30d supply, fill #0
  Filled 2022-06-05: qty 30, 30d supply, fill #1
  Filled 2022-07-02: qty 30, 30d supply, fill #2

## 2022-05-07 MED ORDER — HYDROXYZINE HCL 25 MG PO TABS
ORAL_TABLET | ORAL | 2 refills | Status: DC
  Filled 2022-05-07: qty 30, 30d supply, fill #0
  Filled 2022-06-07 – 2022-06-15 (×2): qty 30, 30d supply, fill #1
  Filled 2022-08-14: qty 30, 30d supply, fill #2

## 2022-05-08 ENCOUNTER — Other Ambulatory Visit: Payer: Self-pay

## 2022-05-14 ENCOUNTER — Other Ambulatory Visit: Payer: Self-pay

## 2022-05-15 ENCOUNTER — Other Ambulatory Visit: Payer: Self-pay

## 2022-05-17 ENCOUNTER — Other Ambulatory Visit: Payer: Self-pay

## 2022-05-24 ENCOUNTER — Ambulatory Visit: Payer: Self-pay | Admitting: *Deleted

## 2022-05-24 ENCOUNTER — Other Ambulatory Visit: Payer: Self-pay

## 2022-05-24 MED ORDER — MOLNUPIRAVIR EUA 200MG CAPSULE
4.0000 | ORAL_CAPSULE | Freq: Two times a day (BID) | ORAL | 0 refills | Status: AC
  Filled 2022-05-24: qty 40, 5d supply, fill #0

## 2022-05-24 NOTE — Telephone Encounter (Signed)
Angel [name]  this is a very high risk patient and I suggest treating her with anti viral oral medication I sent it to our pharmacy downstairs should be ZERO cost to the patient    She will have to send someone to pick up  I will try to call her again after my meeting starting soon  Molnupiravir 4 capsules two times a day for 5 days

## 2022-05-24 NOTE — Telephone Encounter (Signed)
Routing to PCP for review.

## 2022-05-24 NOTE — Telephone Encounter (Signed)
Summary: exposed to covid   Pt states she was exposed to covid on 8-14   Pt states she has a sore throat, swollen glands   Pt took home covid test on 8-16 and was neg, pt inquiring on next steps   Please advise        Chief Complaint: covid exposure, high risk  Symptoms: sore throat , swollen glands. Frequency: 05/21/22 Pertinent Negatives: Patient denies chest pain no difficulty breathing, no fever, cough no runny nose Disposition: [] ED /[x] Urgent Care (no appt availability in office) / [] Appointment(In office/virtual)/ []  [facility]/ [] Home Care/ [] Refused Recommended Disposition /[] [facility]/ [x]  Follow-up with PCP Additional Notes:   Reports at home test on 05/23/22 negative for covid. Patient wanted to make PCP aware due to high risk immunity liver issues. No available appt noted. Recommended to be tested again for covid due to 3 days after exposure at funeral. Please advise      Reason for Disposition  [1] COVID-19 EXPOSURE within last 14 days AND [2] weak immune system (e.g., HIV positive, cancer chemo, splenectomy, organ transplant, chronic steroids) AND [3] NO symptoms  Answer Assessment - Initial Assessment Questions 1. COVID-19 EXPOSURE: "Please describe how you were exposed to someone with a COVID-19 infection."     Attended funeral 05/21/22 2. PLACE of CONTACT: "Where were you when you were exposed to COVID-19?" (e.g., home, school, medical waiting room; which city?)     Funeral  3. TYPE of CONTACT: "How much contact was there?" (e.g., sitting next to, live in same house, work in same office, same building)     Hugged multiple people 4. DURATION of CONTACT: "How long were you in contact with the COVID-19 patient?" (e.g., a few seconds, passed by person, a few minutes, 15 minutes or longer, live with the patient)     Longer than 15 minutes  5. MASK: "Were you wearing a mask?" "Was the other person wearing a mask?" Note: wearing a mask reduces the  risk of an otherwise close contact.     no mask 6. DATE of CONTACT: "When did you have contact with a COVID-19 patient?" (e.g., how many days ago)     05/21/22 7. COMMUNITY SPREAD: "Do you live in or have you traveled to an area where there are lots of COVID-19 cases (community spread)?" (See public health department website, if unsure)       [place]  8. SYMPTOMS: "Do you have any symptoms?" (e.g., fever, cough, breathing difficulty, loss of taste or smell)     Sore throat, swollen glands 9. VACCINE: "Have you gotten the COVID-19 vaccine?" If Yes, ask: "Which one, how many shots, when did you get it?"     na 10. PREGNANCY OR POSTPARTUM: "Is there any chance you are pregnant?" "When was your last menstrual period?" "Did you deliver in the last 2 weeks?"       na 11. HIGH RISK: "Do you have any heart or lung problems?" (e.g., asthma, COPD, heart failure) "Do you have a weak immune system or other risk factors?" (e.g., HIV positive, chemotherapy, renal failure, diabetes mellitus, sickle cell anemia, obesity)       High risk liver issues  Protocols used: Coronavirus (COVID-19) Exposure-A-AH

## 2022-05-25 NOTE — Telephone Encounter (Signed)
Pt has been informed of medication being sent to pharmacy. 

## 2022-06-05 ENCOUNTER — Other Ambulatory Visit: Payer: Self-pay

## 2022-06-07 ENCOUNTER — Other Ambulatory Visit: Payer: Self-pay

## 2022-06-14 ENCOUNTER — Other Ambulatory Visit: Payer: Self-pay

## 2022-06-14 ENCOUNTER — Other Ambulatory Visit: Payer: Self-pay | Admitting: Critical Care Medicine

## 2022-06-14 MED ORDER — FOLIC ACID 1 MG PO TABS
1.0000 mg | ORAL_TABLET | Freq: Every day | ORAL | 0 refills | Status: DC
Start: 2022-06-14 — End: 2022-07-16
  Filled 2022-06-14: qty 30, 30d supply, fill #0

## 2022-06-15 ENCOUNTER — Other Ambulatory Visit: Payer: Self-pay

## 2022-06-17 NOTE — Progress Notes (Signed)
Established Patient Office Visit  Subjective   Patient ID: Angel [name], female    DOB: 1956-04-26  Age: 66 y.o. MRN: [id number]  No chief complaint on file.   TOC OV d/c is < 7 days from OV , RN TOC OV has occured  Angel [name] is a 66 year old female who presents for follow up after a hospital admission. Patient with history of COPD, hyperlipidemia, hypertension, aortic arthrosclerosis, GERD and tobacco use. She was admitted from 7/05-7/07 for liver cirrhosis with ascites, patient had no prior knowledge of this condition.  Reports she has not drank alcohol in 2 weeks.   While admitted she had 3L of fluid taken off and was started on furesomide and aldactone daily. She was told to discontinue her daily statin and hypertensive medication (amlodipine). Labs also showed a low vitamin D level, and elevated iron. She was referred to [facility] for further workup and EGD. States she has made this appointment.   Currently she is managing her COPD with albuterol, advair and a duonebulizer. She feels like it is well controlled. She is still smoking cigarettes but reports she has cutdown recently as she has a roommate now and is not allowed to.   In addition, she reports a left lower leg/foot pain and rash that began prior to hospital admission. She was started on oral Bactrim following admission and is to continue til 7/21. Also states intermittent cramping of her bilateral lower extremities, worse at night. This has been ongoing for over 6 months per patient. She is unsure of any aggravating or alleviating factors.   Currently Angel [name] works as a Secretary/administrator but has been unable to due to the recent medical events.   DC summary reviewed.   DOB:[birth year]  Date of Admission: 04/11/2022 Date of Discharge: 04/13/2022  Attending Physician:Jeffrey Hennie Duos, MD  Patient's RJJ:OACZYSA, [name], NP  Consults: [facility]   Disposition: D/C home   Follow-up Appts:   Follow-up  Information      Gildardo Pounds, NP Follow up in 1 week(s).  Specialty: Nurse Practitioner Contact information: [place] [address] [phone]          Ronnette Juniper, MD. Call.  Specialty: Gastroenterology Contact information: [place] [address] [phone]              Tests Needing Follow-up: -Check electrolytes with patient newly placed on Lasix and Aldactone -Assure patient continues to abstain from alcohol -Follow-up pending results from serologic cirrhosis work-up  Discharge Diagnoses: Newly diagnosed cirrhosis of the liver with ascites Elevated iron Elevated F-Actin IgG Moderate Malnutritionrelated to liver cirrhosis Thrombocytopenia COPD HLD  Initial presentation: 66 year old with a history of COPD, alcohol abuse, cirrhosis of the liver, HTN, and HLD who presented to the ER with abdominal pain which has been worsening over a 3-week. And associated with distention in the abdomen.  Hospital Course:  Newly diagnosed cirrhosis of the liver with ascites No evidence of SBP on ascites fluid testing status post paracentesis removing 3 L- diuretics initiated - plan for outpatient EGD for variceal screening - full work-up to rule out other potential etiologies underwaywith some serologic studies pendingat time of d/c - will be addressed in f/u w/ GI service - acute viral hepatitis panel negative -patient has been counseled numerous times as to the absolute need to fully abstain from alcohol permanently  Elevated iron Iron level noted to be modestly elevated at 221 -work-up underway -  for outpatient follow-up in GI office  Elevated F-Actin IgG ?autoimmune hepatitis - GI following  Moderate Malnutritionrelated to liver cirrhosis as evidenced byenergy intake < or equal to 75% for > or equal to 1 month & mild fat depletion  Thrombocytopenia Due to liver disease/splenic  sequestration -platelet count stable with no evidence of spontaneous bleeding.  His hospital stay  Alcohol abuse Patient counseled at great length about the absolute need to avoid alcohol completely going forward and the resumption of alcohol abuse would lead to liver failure and death  COPD Well compensated at present -continue usual home medications  HLD Statin on hold due to elevated LFTs   9/11  HTN (hypertension)   Amlodipine held following recent hospital admission, and furosemide and aldactone were started.  Will hold furosemide at this time as she is not fluid overloaded and BP at 116/69 today.     Relevant Medications  spironolactone (ALDACTONE) 100 MG tablet  Aortic atherosclerosis (HCC)   Statin held at this time due to liver function. Explained to patient that we will eventually restart a cholesterol medication once we have a handle on cirrhosis treatment.  Repeat lipid labs ordered  Diet and lifestyle handout emphasized  The following Lifestyle Medicine recommendations according to [place] of Lifestyle Medicine [facility] discussed and offered to patient who agrees to start the journey:  A. Whole Foods, Plant-based plate comprising of fruits and vegetables, plant-based proteins, whole-grain carbohydrates was discussed in detail with the patient. A list for source of those nutrients were also provided to the patient. Patient will use only water or unsweetened tea for hydration. B. The need to stay away from risky substances including alcohol, smoking; obtaining 7 to 9 hours of restorative sleep, at least 150 minutes of moderate intensity exercise weekly, the importance of healthy social connections, and stress reduction techniques were discussed.      Relevant Medications  spironolactone (ALDACTONE) 100 MG tablet   Respiratory  COPD (chronic obstructive pulmonary disease) (HCC)   Cont advair for now  Smoking cessation advised    Relevant  Medications  albuterol (VENTOLIN HFA) 108 (90 Base) MCG/ACT inhaler  ipratropium-albuterol (DUONEB) 0.5-2.5 (3) MG/3ML SOLN  Bronchiectasis without acute exacerbation (HCC)   As per copd      Digestive  Alcoholic cirrhosis of liver with ascites ([place]) - Primary   As per cirrhosis eval Ascites no growth on cultures    Relevant Orders  Comprehensive metabolic panel  CBC with Differential/Platelet  Cirrhosis ([place])   New diagnosis following most recent hospital admission. Labs show AST=393, ALT=229, Alk Phos= 57. Patient with no prior knowledge of liver damage. US abdomen reviewed. Lactulose was to be started but patient was unable to obtain. Lactulose sent to pharmacy today.  Labs also revealed Elevated F-Actin IgG, and elevated serum iron.  Need to r/o hemachromatosis Hep A, B C all neg  She is to see GI for EGD and further workup   Thiamine and Folic acid started once daily     Relevant Orders  Comprehensive metabolic panel  CBC with Differential/Platelet   Other  Smoker       Current smoking consumption amount: 1/2 ppd   Dicsussion on advise to quit smoking and smoking impacts: cv lung impacts   Patient's willingness to quit:  May quit   Methods to quit smoking discussed:  behav mod, nicotine replacement lozenge   Medication management of smoking session drugs discussed:nicotine replacement   Resources provided:  AVS    Setting quit  date not established   Follow-up arranged 1 mo   Time spent counseling the patient:  5 min     Environmental allergies   Cont zyrtec    Abdominal pain   RUQ pain and LLQ pain with palpation. Lipase ordered. Has upcoming GI appt.     Relevant Orders  Lipase  HLD (hyperlipidemia)   Statin held at this time. Lipid panel ordered. Diet handout emphasized greatly to patient as she is not on a cholesterol medication at this time given her recent history.    Rx for covid recently 8/17     Patient Active  Problem List   Diagnosis Date Noted  . Aortic atherosclerosis ([place]) 04/19/2022  . Vitamin D deficiency 04/19/2022  . Encounter for health-related screening 04/19/2022  . Iron overload 04/19/2022  . Cellulitis of left lower extremity 04/19/2022  . Alcohol use 04/19/2022  . Cirrhosis ([place]) 04/12/2022  . Malnutrition of moderate degree 04/12/2022  . Abdominal pain 04/11/2022  . HTN (hypertension) 04/11/2022  . HLD (hyperlipidemia) 04/11/2022  . Hypoalbuminemia 04/11/2022  . Alcoholic cirrhosis of liver with ascites ([place]) 04/11/2022  . Bronchiectasis without acute exacerbation ([place]) 03/06/2014  . COPD (chronic obstructive pulmonary disease) ([place]) 02/19/2014  . Environmental allergies 02/19/2014  . Smoker 12/31/2013   Past Medical History:  Diagnosis Date  . Asthma   . Bronchiolitis   . COPD (chronic obstructive pulmonary disease) ([place])   . Hypertension    Past Surgical History:  Procedure Laterality Date  . ABDOMINAL HYSTERECTOMY    . APPENDECTOMY    . BACK SURGERY     Social History   Tobacco Use  . Smoking status: Former    Packs/day: 0.50    Years: 30.00    Total pack years: 15.00    Types: Cigarettes    Quit date: 12/30/2013    Years since quitting: 8.4  . Smokeless tobacco: Never  Substance Use Topics  . Alcohol use: Yes    Alcohol/week: 28.0 standard drinks of alcohol    Types: 28 Glasses of wine per week  . Drug use: Yes    Frequency: 3.0 times per week    Types: Marijuana   Family History  Problem Relation Age of Onset  . CAD Father   . Heart disease Father   . Colon cancer Mother   . Breast cancer Sister   . Emphysema Paternal Grandfather        never smoker, IT trainer   Allergies  Allergen Reactions  . Penicillins Hives      Review of Systems  Constitutional:  Positive for malaise/fatigue. Negative for chills, diaphoresis, fever and weight loss.  HENT:  Negative for congestion, hearing loss, nosebleeds, sore throat and tinnitus.   Eyes:   Negative for blurred vision, photophobia and redness.  Respiratory:  Positive for cough and shortness of breath. Negative for hemoptysis, sputum production, wheezing and stridor.   Cardiovascular:  Positive for claudication and leg swelling. Negative for chest pain, palpitations, orthopnea and PND.  Gastrointestinal:  Positive for abdominal pain, constipation and nausea. Negative for blood in stool, diarrhea, heartburn and vomiting.       Anorexia  Genitourinary:  Negative for dysuria, flank pain, frequency, hematuria and urgency.  Musculoskeletal:  Positive for joint pain. Negative for back pain, falls, myalgias and neck pain.  Skin:  Positive for itching and rash.  Neurological:  Negative for dizziness, tingling, tremors, sensory change, speech change, focal weakness, seizures, loss of consciousness, weakness and headaches.  Endo/Heme/Allergies:  Negative  for environmental allergies and polydipsia. Does not bruise/bleed easily.  Psychiatric/Behavioral:  Negative for depression, memory loss, substance abuse and suicidal ideas. The patient is not nervous/anxious and does not have insomnia.   All other systems reviewed and are negative.     Objective:     There were no vitals taken for this visit.   Physical Exam Constitutional:      Appearance: Normal appearance.  HENT:     Head: Normocephalic.     Right Ear: Tympanic membrane, ear canal and external ear normal.     Left Ear: Tympanic membrane, ear canal and external ear normal.     Nose: Nose normal.     Mouth/Throat:     Mouth: Mucous membranes are moist.     Pharynx: Oropharynx is clear.  Eyes:     Pupils: Pupils are equal, round, and reactive to light.  Neck:     Comments: Prominent tender bilateral cervical lymphadenopathy Cardiovascular:     Rate and Rhythm: Normal rate and regular rhythm.     Pulses: Normal pulses.     Heart sounds: Normal heart sounds.  Pulmonary:     Breath sounds: Wheezing present.  Abdominal:      Palpations: Abdomen is soft.     Tenderness: There is abdominal tenderness (RUQ, LLQ).  Lymphadenopathy:     Cervical: No cervical adenopathy.  Skin:    General: Skin is warm.     Findings: Rash (lower left lateral calf, top of foot) present.  Neurological:     General: No focal deficit present.     Mental Status: She is alert and oriented to person, place, and time.  Psychiatric:        Mood and Affect: Mood normal.        Behavior: Behavior normal.     No results found for any visits on 06/18/22.    The ASCVD Risk score (Arnett DK, et al., 2019) failed to calculate for the following reasons:   The valid total cholesterol range is 130 to 320 mg/dL    Assessment & Plan:   Problem List Items Addressed This Visit   None Will do mammogram at a later date     Has had total TAH BSO including cervix does not need Pap Will get colonoscopy with GI 45 min spent reviewing old chart History physical and high degree of complexity   No follow-ups on file.    Asencion Noble, MD

## 2022-06-18 ENCOUNTER — Encounter: Payer: Self-pay | Admitting: Critical Care Medicine

## 2022-06-18 ENCOUNTER — Other Ambulatory Visit: Payer: Self-pay

## 2022-06-18 ENCOUNTER — Ambulatory Visit: Payer: Self-pay | Attending: Critical Care Medicine | Admitting: Critical Care Medicine

## 2022-06-18 VITALS — BP 129/78 | HR 85 | Ht 67.0 in | Wt 140.8 lb

## 2022-06-18 DIAGNOSIS — F17200 Nicotine dependence, unspecified, uncomplicated: Secondary | ICD-10-CM

## 2022-06-18 DIAGNOSIS — E440 Moderate protein-calorie malnutrition: Secondary | ICD-10-CM

## 2022-06-18 DIAGNOSIS — E782 Mixed hyperlipidemia: Secondary | ICD-10-CM

## 2022-06-18 DIAGNOSIS — Z789 Other specified health status: Secondary | ICD-10-CM

## 2022-06-18 DIAGNOSIS — J432 Centrilobular emphysema: Secondary | ICD-10-CM

## 2022-06-18 DIAGNOSIS — K7031 Alcoholic cirrhosis of liver with ascites: Secondary | ICD-10-CM

## 2022-06-18 DIAGNOSIS — Z23 Encounter for immunization: Secondary | ICD-10-CM

## 2022-06-18 DIAGNOSIS — I700 Atherosclerosis of aorta: Secondary | ICD-10-CM

## 2022-06-18 DIAGNOSIS — I10 Essential (primary) hypertension: Secondary | ICD-10-CM

## 2022-06-18 DIAGNOSIS — Z1231 Encounter for screening mammogram for malignant neoplasm of breast: Secondary | ICD-10-CM

## 2022-06-18 DIAGNOSIS — L03116 Cellulitis of left lower limb: Secondary | ICD-10-CM

## 2022-06-18 DIAGNOSIS — E559 Vitamin D deficiency, unspecified: Secondary | ICD-10-CM

## 2022-06-18 MED ORDER — TETANUS-DIPHTH-ACELL PERTUSSIS 5-2-15.5 LF-MCG/0.5 IM SUSP
0.5000 mL | Freq: Once | INTRAMUSCULAR | 0 refills | Status: AC
  Filled 2022-08-10: qty 0.5, 1d supply, fill #0

## 2022-06-18 NOTE — Assessment & Plan Note (Signed)
Ideally would benefit from statin therapy but need to hold off for now

## 2022-06-18 NOTE — Assessment & Plan Note (Signed)
Not currently drinking alcohol 

## 2022-06-18 NOTE — Assessment & Plan Note (Signed)
Improved nutritional status protein status improved

## 2022-06-18 NOTE — Patient Instructions (Signed)
No change in medications  Prevnar pneumonia vaccine given and flu vaccine given today  We will have you sign an application for a tetanus shot downstairs at the pharmacy they will bring you back for that after disapproved   Refills on your medications available to pharmacy  Mammogram will be ordered through the scholarship program  Return to Dr. Delford Field 3 months

## 2022-06-18 NOTE — Assessment & Plan Note (Signed)
Continue with vitamin-D supplement.

## 2022-06-18 NOTE — Assessment & Plan Note (Signed)
Hypertension currently at goal no change in medications

## 2022-06-18 NOTE — Assessment & Plan Note (Signed)
Continue inhaled medications 

## 2022-06-18 NOTE — Assessment & Plan Note (Signed)
This has resolved.

## 2022-06-18 NOTE — Assessment & Plan Note (Signed)
  .   Current smoking consumption amount: 1/2 ppd  Dicsussion on advise to quit smoking and smoking impacts: cv lung impacts  Patient's willingness to quit:  May quit  Methods to quit smoking discussed:  behav mod, nicotine replacement lozenge  Medication management of smoking session drugs discussed:nicotine replacement  Resources provided:  AVS   Setting quit date not established  Follow-up arranged 1 mo   Time spent counseling the patient:  5 min  

## 2022-06-18 NOTE — Assessment & Plan Note (Signed)
Cirrhotic liver disease follow-up per gastroenterology

## 2022-06-18 NOTE — Assessment & Plan Note (Signed)
Monitor off statins for now until liver function improves

## 2022-06-19 ENCOUNTER — Other Ambulatory Visit: Payer: Self-pay

## 2022-06-21 ENCOUNTER — Other Ambulatory Visit: Payer: Self-pay | Admitting: Gastroenterology

## 2022-06-21 ENCOUNTER — Other Ambulatory Visit (HOSPITAL_COMMUNITY): Payer: Self-pay | Admitting: Gastroenterology

## 2022-06-21 ENCOUNTER — Other Ambulatory Visit: Payer: Self-pay

## 2022-06-21 DIAGNOSIS — K7460 Unspecified cirrhosis of liver: Secondary | ICD-10-CM

## 2022-06-21 DIAGNOSIS — R188 Other ascites: Secondary | ICD-10-CM

## 2022-06-22 ENCOUNTER — Ambulatory Visit (HOSPITAL_COMMUNITY)
Admission: RE | Admit: 2022-06-22 | Discharge: 2022-06-22 | Disposition: A | Discharge status: Home / Self Care | Payer: Self-pay | Source: Ambulatory Visit | Attending: Gastroenterology | Admitting: Gastroenterology

## 2022-06-22 ENCOUNTER — Other Ambulatory Visit (HOSPITAL_COMMUNITY): Payer: Self-pay | Admitting: Gastroenterology

## 2022-06-22 DIAGNOSIS — K7460 Unspecified cirrhosis of liver: Secondary | ICD-10-CM

## 2022-06-22 DIAGNOSIS — R188 Other ascites: Secondary | ICD-10-CM | POA: Insufficient documentation

## 2022-06-22 MED ORDER — LIDOCAINE HCL 1 % IJ SOLN
INTRAMUSCULAR | Status: AC
  Filled 2022-06-22: qty 20

## 2022-06-22 NOTE — Progress Notes (Signed)
Pt presented to [place] for scheduled paracentesis. Upon US examination, there was no fluid on L and a very small fluid pocket on R that was not large enough to safely access.  Exam was ended and explained to patient.  Patient was in agreement with findings.       Alex Gardener, AGNP-BC 06/22/2022, 2:14 PM

## 2022-07-02 ENCOUNTER — Other Ambulatory Visit: Payer: Self-pay

## 2022-07-02 ENCOUNTER — Other Ambulatory Visit: Payer: Self-pay | Admitting: Critical Care Medicine

## 2022-07-03 NOTE — Telephone Encounter (Signed)
Requested medication (s) are due for refill today: yes, new prescription   Requested medication (s) are on the active medication list: yes  Last refill:  05/07/22  Future visit scheduled:yes  Notes to clinic:  Unable to refill per protocol, cannot delegate.  Pharmacy comment: PATIENT SAYS SHE WAS UPED TO 20MG  WE NEED A SCRIPT REFLECTING THAT PLEASE     Requested Prescriptions  Pending Prescriptions Disp Refills   predniSONE (DELTASONE) 10 MG tablet 30 tablet 2    Sig: TAKE 1 TABLET BY MOUTH DAILY     Not Delegated - Endocrinology:  Oral Corticosteroids Failed - 07/02/2022 11:33 AM      Failed - This refill cannot be delegated      Failed - Manual Review: Eye exam for IOP if prolonged treatment      Failed - Bone Mineral Density or Dexa Scan completed in the last 2 years      Passed - Glucose (serum) in normal range and within 180 days    Glucose  Date Value Ref Range Status  04/19/2022 81 70 - 99 mg/dL Final   Glucose, Bld  Date Value Ref Range Status  04/13/2022 133 (H) 70 - 99 mg/dL Final    Comment:    Glucose reference range applies only to samples taken after fasting for at least 8 hours.         Passed - K in normal range and within 180 days    Potassium  Date Value Ref Range Status  04/19/2022 4.4 3.5 - 5.2 mmol/L Final         Passed - Na in normal range and within 180 days    Sodium  Date Value Ref Range Status  04/19/2022 134 134 - 144 mmol/L Final         Passed - Last BP in normal range    BP Readings from Last 1 Encounters:  06/22/22 108/64         Passed - Valid encounter within last 6 months    Recent Outpatient Visits           2 weeks ago Alcoholic cirrhosis of liver with ascites [facility])   [place] Elsie Stain, MD   2 months ago Alcoholic cirrhosis of liver with ascites [facility])   [place] Elsie Stain, MD   3 years ago Hospital discharge follow-up   [place], Zelda W, NP   8 years ago COPD (chronic obstructive pulmonary disease)   [place] Lorayne Marek, MD       Future Appointments             In 2 months Joya Gaskins Burnett Harry, MD [place]

## 2022-07-06 ENCOUNTER — Other Ambulatory Visit: Payer: Self-pay

## 2022-07-16 ENCOUNTER — Other Ambulatory Visit: Payer: Self-pay | Admitting: Critical Care Medicine

## 2022-07-16 ENCOUNTER — Other Ambulatory Visit: Payer: Self-pay

## 2022-07-16 ENCOUNTER — Ambulatory Visit: Payer: Self-pay | Admitting: *Deleted

## 2022-07-16 MED ORDER — FOLIC ACID 1 MG PO TABS
1.0000 mg | ORAL_TABLET | Freq: Every day | ORAL | 1 refills | Status: DC
  Filled 2022-07-16: qty 30, 30d supply, fill #0
  Filled 2022-08-14: qty 30, 30d supply, fill #1

## 2022-07-16 NOTE — Telephone Encounter (Signed)
Summary: leg discomfort   The patient shares that they're continuing to experience previously discussed leg discomfort and cramping in the evenings   The patient shares that it is mostly in the back of their legs and in their feet   The patient has not noticed any swelling   The patient shares that they experience the same sensation at night some   Please contact further when possible      Chief Complaint: leg/foot cramping Symptoms: bilateral leg and foot cramping- normally at rest/night Frequency: worse in last 4 days Pertinent Negatives: Patient denies chest pain, back pain, breathing difficulty, swelling, rash, fever, numbness, weakness  Disposition: [] ED /[] Urgent Care (no appt availability in office) / [] Appointment(In office/virtual)/ []  [place] [facility]/ [] Home Care/ [] Refused Recommended Disposition /[] [place] [facility]/ [x]  Follow-up with PCP Additional Notes: Patient has transportation issues -mobile unit is out of town next 3 days. Patient wants message sent to PCP_ thinks he may be able to treat her without appointment- since chronic problems. Patient has trip planned in 2 weeks- going out of states to see sister. Reason for Disposition  [1] MODERATE pain (e.g., interferes with normal activities, limping) AND [2] present > 3 days  Answer Assessment - Initial Assessment Questions 1. ONSET: "When did the pain start?"      Chronic- worse for last 4 days 2. LOCATION: "Where is the pain located?"      Both legs-feet- ankels to knee 3. PAIN: "How bad is the pain?"    (Scale 1-10; or mild, moderate, severe)   -  MILD (1-3): doesn't interfere with normal activities    -  MODERATE (4-7): interferes with normal activities (e.g., work or school) or awakens from sleep, limping    -  SEVERE (8-10): excruciating pain, unable to do any normal activities, unable to walk     moderate 4. WORK OR EXERCISE: "Has there been any recent work or exercise that involved this part of  the body?"      no 5. CAUSE: "What do you think is causing the leg pain?"     Chronic- dehydration in the past 6. OTHER SYMPTOMS: "Do you have any other symptoms?" (e.g., chest pain, back pain, breathing difficulty, swelling, rash, fever, numbness, weakness)     Red spots on arms- like "little bruises" 7. PREGNANCY: "Is there any chance you are pregnant?" "When was your last menstrual period?"  Protocols used: Leg Pain-A-AH

## 2022-07-17 ENCOUNTER — Ambulatory Visit: Payer: Self-pay | Admitting: Critical Care Medicine

## 2022-07-17 ENCOUNTER — Other Ambulatory Visit: Payer: Self-pay

## 2022-07-17 MED ORDER — METHOCARBAMOL 500 MG PO TABS
500.0000 mg | ORAL_TABLET | Freq: Four times a day (QID) | ORAL | 1 refills | Status: DC | PRN
  Filled 2022-07-17: qty 60, 15d supply, fill #0
  Filled 2022-08-28: qty 60, 15d supply, fill #1

## 2022-07-17 NOTE — Telephone Encounter (Signed)
Left message on voicemail that Rx for muscle relaxer was sent to pharmacy. Can return call for additional questions.

## 2022-07-17 NOTE — Addendum Note (Signed)
Addended by: Elsie Stain on: 07/17/2022 05:37 AM   Modules accepted: Orders

## 2022-07-17 NOTE — Telephone Encounter (Signed)
Given her history I am willing to Rx methacarbamol to our pharmacy for muscle spasm

## 2022-07-27 ENCOUNTER — Other Ambulatory Visit: Payer: Self-pay

## 2022-07-27 ENCOUNTER — Other Ambulatory Visit: Payer: Self-pay | Admitting: Critical Care Medicine

## 2022-07-27 MED ORDER — PREDNISONE 10 MG PO TABS
10.0000 mg | ORAL_TABLET | Freq: Every day | ORAL | 2 refills | Status: DC
  Filled 2022-07-27: qty 30, 30d supply, fill #0
  Filled 2022-08-28: qty 30, 30d supply, fill #1

## 2022-07-27 MED ORDER — LACTULOSE 10 GM/15ML PO SOLN
10.0000 g | Freq: Three times a day (TID) | ORAL | 2 refills | Status: DC
  Filled 2022-07-27: qty 1419, 32d supply, fill #0
  Filled 2022-08-28: qty 1419, 32d supply, fill #1

## 2022-07-27 NOTE — Telephone Encounter (Signed)
Requested Prescriptions  Pending Prescriptions Disp Refills  . lactulose (CHRONULAC) 10 GM/15ML solution 1419 mL 2    Sig: Take 15 mLs (10 g total) by mouth 3 (three) times daily.     Gastroenterology:  Laxatives - lactulose Passed - 07/27/2022 11:37 AM      Passed - Cl in normal range and within 360 days    Chloride  Date Value Ref Range Status  04/19/2022 101 96 - 106 mmol/L Final         Passed - CO2 in normal range and within 360 days    CO2  Date Value Ref Range Status  04/19/2022 21 20 - 29 mmol/L Final   Bicarbonate  Date Value Ref Range Status  04/15/2019 23.9 20.0 - 28.0 mmol/L Final         Passed - K in normal range and within 360 days    Potassium  Date Value Ref Range Status  04/19/2022 4.4 3.5 - 5.2 mmol/L Final         Passed - Na in normal range and within 360 days    Sodium  Date Value Ref Range Status  04/19/2022 134 134 - 144 mmol/L Final         Passed - Valid encounter within last 12 months    Recent Outpatient Visits          1 month ago Alcoholic cirrhosis of liver with ascites [facility])   [place] Elsie Stain, MD   3 months ago Alcoholic cirrhosis of liver with ascites [facility])   [place] Elsie Stain, MD   3 years ago Hospital discharge follow-up   [place], Zelda W, NP   8 years ago COPD (chronic obstructive pulmonary disease)   [place] Lorayne Marek, MD      Future Appointments            In 1 month Joya Gaskins Burnett Harry, MD [place]

## 2022-08-06 ENCOUNTER — Emergency Department (HOSPITAL_COMMUNITY): Payer: Self-pay

## 2022-08-06 ENCOUNTER — Other Ambulatory Visit: Payer: Self-pay

## 2022-08-06 ENCOUNTER — Ambulatory Visit: Payer: Self-pay | Admitting: *Deleted

## 2022-08-06 ENCOUNTER — Encounter (HOSPITAL_COMMUNITY): Payer: Self-pay

## 2022-08-06 ENCOUNTER — Emergency Department (HOSPITAL_COMMUNITY)
Admission: EM | Admit: 2022-08-06 | Discharge: 2022-08-07 | Disposition: A | Discharge status: Home / Self Care | Payer: Self-pay | Attending: Emergency Medicine | Admitting: Emergency Medicine

## 2022-08-06 DIAGNOSIS — J45909 Unspecified asthma, uncomplicated: Secondary | ICD-10-CM | POA: Insufficient documentation

## 2022-08-06 DIAGNOSIS — Z87891 Personal history of nicotine dependence: Secondary | ICD-10-CM | POA: Insufficient documentation

## 2022-08-06 DIAGNOSIS — I10 Essential (primary) hypertension: Secondary | ICD-10-CM | POA: Insufficient documentation

## 2022-08-06 DIAGNOSIS — Z79899 Other long term (current) drug therapy: Secondary | ICD-10-CM | POA: Insufficient documentation

## 2022-08-06 DIAGNOSIS — N3000 Acute cystitis without hematuria: Secondary | ICD-10-CM | POA: Insufficient documentation

## 2022-08-06 DIAGNOSIS — R1011 Right upper quadrant pain: Secondary | ICD-10-CM

## 2022-08-06 DIAGNOSIS — Z7951 Long term (current) use of inhaled steroids: Secondary | ICD-10-CM | POA: Insufficient documentation

## 2022-08-06 DIAGNOSIS — J449 Chronic obstructive pulmonary disease, unspecified: Secondary | ICD-10-CM | POA: Insufficient documentation

## 2022-08-06 HISTORY — DX: Alcoholic cirrhosis of liver without ascites: K70.30

## 2022-08-06 LAB — CBC WITH DIFFERENTIAL/PLATELET
Abs Immature Granulocytes: 0.05 10*3/uL (ref 0.00–0.07)
Basophils Absolute: 0 10*3/uL (ref 0.0–0.1)
Basophils Relative: 1 %
Eosinophils Absolute: 0 10*3/uL (ref 0.0–0.5)
Eosinophils Relative: 0 %
HCT: 45.1 % (ref 36.0–46.0)
Hemoglobin: 15.3 g/dL — ABNORMAL HIGH (ref 12.0–15.0)
Immature Granulocytes: 1 %
Lymphocytes Relative: 19 %
Lymphs Abs: 1.4 10*3/uL (ref 0.7–4.0)
MCH: 32.6 pg (ref 26.0–34.0)
MCHC: 33.9 g/dL (ref 30.0–36.0)
MCV: 96 fL (ref 80.0–100.0)
Monocytes Absolute: 0.7 10*3/uL (ref 0.1–1.0)
Monocytes Relative: 9 %
Neutro Abs: 5 10*3/uL (ref 1.7–7.7)
Neutrophils Relative %: 70 %
Platelets: 171 10*3/uL (ref 150–400)
RBC: 4.7 MIL/uL (ref 3.87–5.11)
RDW: 13.2 % (ref 11.5–15.5)
WBC: 7.2 10*3/uL (ref 4.0–10.5)
nRBC: 0 % (ref 0.0–0.2)

## 2022-08-06 LAB — APTT: aPTT: 37 seconds — ABNORMAL HIGH (ref 24–36)

## 2022-08-06 LAB — COMPREHENSIVE METABOLIC PANEL
ALT: 36 U/L (ref 0–44)
AST: 58 U/L — ABNORMAL HIGH (ref 15–41)
Albumin: 3.3 g/dL — ABNORMAL LOW (ref 3.5–5.0)
Alkaline Phosphatase: 101 U/L (ref 38–126)
Anion gap: 8 (ref 5–15)
BUN: 23 mg/dL (ref 8–23)
CO2: 26 mmol/L (ref 22–32)
Calcium: 9.1 mg/dL (ref 8.9–10.3)
Chloride: 99 mmol/L (ref 98–111)
Creatinine, Ser: 1.26 mg/dL — ABNORMAL HIGH (ref 0.44–1.00)
GFR, Estimated: 48 mL/min — ABNORMAL LOW (ref >60)
Glucose, Bld: 181 mg/dL — ABNORMAL HIGH (ref 70–99)
Potassium: 4.8 mmol/L (ref 3.5–5.1)
Sodium: 133 mmol/L — ABNORMAL LOW (ref 135–145)
Total Bilirubin: 0.9 mg/dL (ref 0.3–1.2)
Total Protein: 8.4 g/dL — ABNORMAL HIGH (ref 6.5–8.1)

## 2022-08-06 LAB — PROTIME-INR
INR: 1.2 (ref 0.8–1.2)
Prothrombin Time: 15.3 seconds — ABNORMAL HIGH (ref 11.4–15.2)

## 2022-08-06 LAB — LIPASE, BLOOD: Lipase: 60 U/L — ABNORMAL HIGH (ref 11–51)

## 2022-08-06 MED ORDER — IOHEXOL 300 MG/ML  SOLN
100.0000 mL | Freq: Once | INTRAMUSCULAR | Status: AC | PRN
  Administered 2022-08-06: 100 mL via INTRAVENOUS

## 2022-08-06 NOTE — Telephone Encounter (Signed)
  Chief Complaint: cirrhosis problems  Symptoms: bleeding and bruising Frequency: abd pain constant but at times is really bad Pertinent Negatives: Patient denies fever Disposition: [x] ED /[] Urgent Care (no appt availability in office) / [] Appointment(In office/virtual)/ []  [place] [facility]/ [] Home Care/ [] Refused Recommended Disposition /[] [place] [facility]/ []  Follow-up with PCP Additional Notes: Pt going to [facility] ED after she finishes a phone call with a company that is helping her with insurance, etc.   Reason for Disposition  Patient sounds very sick or weak to the triager  Answer Assessment - Initial Assessment Questions 1. LOCATION: "Where does it hurt?"      Upper abdomin at liver, has cirrhosis 2. RADIATION: "Does the pain shoot anywhere else?" (e.g., chest, back)     Does not move 3. ONSET: "When did the pain begin?" (e.g., minutes, hours or days ago)      Always pain, gets severe off and on 4. SUDDEN: "Gradual or sudden onset?"     sudden 5. PATTERN "Does the pain come and go, or is it constant?"    - If it comes and goes: "How long does it last?" "Do you have pain now?"     (Note: Comes and goes means the pain is intermittent. It goes away completely between bouts.)    - If constant: "Is it getting better, staying the same, or getting worse?"      (Note: Constant means the pain never goes away completely; most serious pain is constant and gets worse.)      Always painful but gets worse at times 6. SEVERITY: "How bad is the pain?"  (e.g., Scale 1-10; mild, moderate, or severe)    - MILD (1-3): Doesn't interfere with normal activities, abdomen soft and not tender to touch..     - MODERATE (4-7): Interferes with normal activities or awakens from sleep, abdomen tender to touch.     - SEVERE (8-10): Excruciating pain, doubled over, unable to do any normal activities.       severe 7. RECURRENT SYMPTOM: "Have you ever had this type of stomach pain before?" If  Yes, ask: "When was the last time?" and "What happened that time?"      Not like this, has  8. AGGRAVATING FACTORS: "Does anything seem to cause this pain?" (e.g., foods, stress, alcohol)     Has chirrosis  Protocols used: Abdominal Pain - Upper-A-AH

## 2022-08-06 NOTE — ED Notes (Signed)
Pt needs IV for CT  

## 2022-08-06 NOTE — ED Provider Triage Note (Signed)
Emergency Medicine Provider Triage Evaluation Note  Angel [name] , a 66 y.o. female  was evaluated in triage.  Pt, hx of cirrhosis, complains of bruising of bilateral arms for several weeks, abdominal swelling and decreased appetite. No hematemesis, no melena/hematochezia. No N/V. Has not drank for the last 6 months. No increased SOB.    Review of Systems  Positive: Abdominal swelling, bruising Negative: Melena, hematamesis  Physical Exam  BP 119/78   Pulse (!) 109   Temp 98.2 F (36.8 C)   Resp 20   SpO2 95%  Gen:   Awake, no distress   Resp:  Normal effort  MSK:   Moves extremities without difficulty  Other:  Diffuse abdominal ttp, w/o guarding.  Medical Decision Making  Medically screening exam initiated at 4:43 PM.  Appropriate orders placed.  KEYUANA WANK was informed that the remainder of the evaluation will be completed by another provider, this initial triage assessment does not replace that evaluation, and the importance of remaining in the ED until their evaluation is complete.     Osvaldo Shipper, [place] 08/06/22 1647

## 2022-08-06 NOTE — ED Triage Notes (Signed)
Pt reports bruising, bleeding, RUQ pain, intermittent abd swelling and nausea x2 weeks.  Pt reports hx of alcoholic cirrhosis. Pt reports no drink in 3 months.

## 2022-08-07 ENCOUNTER — Emergency Department (HOSPITAL_COMMUNITY): Payer: Self-pay

## 2022-08-07 ENCOUNTER — Encounter (HOSPITAL_COMMUNITY): Payer: Self-pay | Admitting: Emergency Medicine

## 2022-08-07 LAB — URINALYSIS, ROUTINE W REFLEX MICROSCOPIC
Bilirubin Urine: NEGATIVE
Glucose, UA: NEGATIVE mg/dL
Hgb urine dipstick: NEGATIVE
Ketones, ur: NEGATIVE mg/dL
Nitrite: POSITIVE — AB
Protein, ur: NEGATIVE mg/dL
Specific Gravity, Urine: >1.046 — ABNORMAL HIGH (ref 1.005–1.030)
pH: 6 (ref 5.0–8.0)

## 2022-08-07 MED ORDER — GADOBUTROL 1 MMOL/ML IV SOLN
7.0000 mL | Freq: Once | INTRAVENOUS | Status: AC | PRN
  Administered 2022-08-07: 7 mL via INTRAVENOUS

## 2022-08-07 MED ORDER — FOSFOMYCIN TROMETHAMINE 3 G PO PACK
3.0000 g | PACK | Freq: Once | ORAL | Status: AC
  Administered 2022-08-07: 3 g via ORAL
  Filled 2022-08-07: qty 3

## 2022-08-07 NOTE — Discharge Instructions (Signed)
You were seen in the emergency room today with abdominal pain.  Your CT and MRI of the abdomen were reviewed and discussed with you and available in the MyChart app.  Please follow closely with your primary care and gastroenterology teams.  Return if you develop worsening pain, fever, discomfort in your back or worsening urinary tract infection symptoms such as burning with urination or urine frequency/urgency.

## 2022-08-07 NOTE — ED Provider Notes (Signed)
Blood pressure (!) 97/58, pulse 88, temperature (!) 97.4 F (36.3 C), temperature source Axillary, resp. rate 18, SpO2 95 %.  Assuming care from Dr. Florina Ou.  In short, Angel [name] is a 66 y.o. female with a chief complaint of Abdominal Pain .  Refer to the original H&P for additional details.  The current plan of care is to follow up on abdominal MRI.  11:51 am MRI with macronodular hepatic cirrhosis but no evidence of hepatocellular carcinoma or other acute findings.  Patient does have findings of UTI but minimal symptoms.  No concern for pyelonephritis clinically.  Patient has scheduled follow-up with her PCP as well as her hepatologist.  Plan for discharge with close follow-up.  Discussed strict ED return precautions.  Patient looking very well and eager for discharge so she can go and eat.   Margette Fast, MD 08/07/22 1152

## 2022-08-07 NOTE — ED Notes (Signed)
Pt is off the unit getting a MRI  will get vitals on pt return

## 2022-08-07 NOTE — ED Provider Notes (Signed)
[facility] Provider Note: Lowella Dell, MD, FACEP  CSN: [id number] MRN: [id number] ARRIVAL: 08/06/22 at 1559 ROOM: WA10/WA10   CHIEF COMPLAINT  Abdominal Pain   HISTORY OF PRESENT ILLNESS  08/07/22 1:09 AM Angel [name] is a 66 y.o. female with alcoholic cirrhosis who has not had a drink in about 3 months.  She is here with about 2 weeks of right upper quadrant pain which she rates as a 6 out of 10.  It is worse with movement or palpation.  She has had intermittent abdominal swelling but none presently.  She has had intermittent nausea as well but no vomiting or diarrhea.  She has noticed some increased bruising as well as a petechial rash on her lower extremities.   Past Medical History:  Diagnosis Date   Alcohol use 04/19/2022   Alcoholic cirrhosis (HCC)    Asthma    Bronchiolitis    COPD (chronic obstructive pulmonary disease) (HCC)    Hypertension     Past Surgical History:  Procedure Laterality Date   ABDOMINAL HYSTERECTOMY     APPENDECTOMY     BACK SURGERY      Family History  Problem Relation Age of Onset   CAD Father    Heart disease Father    Colon cancer Mother    Breast cancer Sister    Emphysema Paternal Grandfather        never smoker, Theatre stage manager    Social History   Tobacco Use   Smoking status: Former    Packs/day: 0.50    Years: 30.00    Total pack years: 15.00    Types: Cigarettes    Quit date: 12/30/2013    Years since quitting: 8.6   Smokeless tobacco: Never  Substance Use Topics   Alcohol use: Yes    Alcohol/week: 28.0 standard drinks of alcohol    Types: 28 Glasses of wine per week   Drug use: Yes    Frequency: 3.0 times per week    Types: Marijuana    Prior to Admission medications   Medication Sig Start Date End Date Taking? Authorizing Provider  albuterol (VENTOLIN HFA) 108 (90 Base) MCG/ACT inhaler Inhale 2 puffs into the lungs every 6 (six) hours as needed for wheezing or shortness of breath. 04/19/22    Storm Frisk, MD  fluticasone furoate-vilanterol (BREO ELLIPTA) 100-25 MCG/ACT AEPB Inhale 1 puff into the lungs daily. 04/19/22   Hoy Register, MD  folic acid (FOLVITE) 1 MG tablet Take 1 tablet (1 mg total) by mouth daily. 07/16/22   Storm Frisk, MD  furosemide (LASIX) 20 MG tablet TAKE 1 TABLET BY MOUTH DAILY 05/07/22     hydrOXYzine (ATARAX) 25 MG tablet TAKE 1 TABLET BY MOUTH ONCE A DAY AS NEEDED FOR ITCHING 05/07/22     ipratropium-albuterol (DUONEB) 0.5-2.5 (3) MG/3ML SOLN Take 3 mLs by nebulization every 6 (six) hours as needed. 04/19/22   Storm Frisk, MD  lactulose (CHRONULAC) 10 GM/15ML solution Take 15 mLs (10 g total) by mouth 3 (three) times daily. 07/27/22   Storm Frisk, MD  methocarbamol (ROBAXIN) 500 MG tablet Take 1 tablet (500 mg total) by mouth every 6 (six) hours as needed for muscle spasms. 07/17/22   Storm Frisk, MD  Multiple Vitamins-Minerals (CENTRUM SILVER PO) Take 1 tablet by mouth daily.    [provider]  omeprazole (PRILOSEC) 20 MG capsule Take 1 capsule (20 mg total) by mouth daily 04/19/22 09/03/22  Shan Levans  E, MD  OVER THE COUNTER MEDICATION Apply 1 application  topically 2 (two) times daily as needed (joint pain). Tiger Balm PO    [provider]  OVER THE COUNTER MEDICATION Apply 1 Application topically 2 (two) times daily as needed (joint pain). Frankincense oil    [provider]  predniSONE (DELTASONE) 10 MG tablet TAKE 1 TABLET BY MOUTH DAILY 07/27/22     spironolactone (ALDACTONE) 100 MG tablet Take 1 tablet (100 mg total) by mouth daily. 04/19/22   Elsie Stain, MD  thiamine 100 MG tablet Take 1 tablet (100 mg total) by mouth daily. 04/19/22   Elsie Stain, MD  Trolamine Salicylate (ASPERCREME EX) Apply 1 Application topically See admin instructions. 1 application 3-4 times a day as needed for joint pain    [provider]  Vitamin D, Ergocalciferol, (DRISDOL) 1.25 MG (50000 UNIT)  CAPS capsule Take 1 capsule (50,000 Units total) by mouth every 7 (seven) days. 04/19/22   Elsie Stain, MD    Allergies Penicillins   REVIEW OF SYSTEMS  Negative except as noted here or in the History of Present Illness.   PHYSICAL EXAMINATION  Initial Vital Signs Blood pressure (!) 102/54, pulse 92, temperature 98 F (36.7 C), temperature source Oral, resp. rate 18, SpO2 92 %.  Examination General: Well-developed, well-nourished female in no acute distress; appearance consistent with age of record HENT: normocephalic; atraumatic Eyes: pupils equal, round and reactive to light; extraocular muscles intact; no scleral icterus Neck: supple Heart: regular rate and rhythm Lungs: clear to auscultation bilaterally Abdomen: soft; nondistended; right upper quadrant tenderness; no masses or hepatosplenomegaly; bowel sounds present Extremities: No deformity; full range of motion; pulses normal Neurologic: Awake, alert and oriented; motor function intact in all extremities and symmetric; no facial droop Skin: Warm and dry; petechial rash of lower legs:    Psychiatric: Normal mood and affect   RESULTS  Summary of this visit's results, reviewed and interpreted by myself:   EKG Interpretation  Date/Time:  Monday August 06 2022 17:28:55 EDT Ventricular Rate:  96 PR Interval:  151 QRS Duration: 96 QT Interval:  376 QTC Calculation: 476 R Axis:   56 Text Interpretation: Sinus rhythm Low voltage, precordial leads Confirmed by Orpah Greek [phone]) on 08/07/2022 11:54:54 AM       Laboratory Studies: Results for orders placed or performed during the hospital encounter of 08/06/22 (from the past 24 hour(s))  Urinalysis, Routine w reflex microscopic Urine, Clean Catch     Status: Abnormal   Collection Time: 08/07/22 12:57 AM  Result Value Ref Range   Color, Urine YELLOW YELLOW   APPearance CLEAR CLEAR   Specific Gravity, Urine >1.046 (H) 1.005 - 1.030   pH 6.0 5.0 -  8.0   Glucose, UA NEGATIVE NEGATIVE mg/dL   Hgb urine dipstick NEGATIVE NEGATIVE   Bilirubin Urine NEGATIVE NEGATIVE   Ketones, ur NEGATIVE NEGATIVE mg/dL   Protein, ur NEGATIVE NEGATIVE mg/dL   Nitrite POSITIVE (A) NEGATIVE   Leukocytes,Ua SMALL (A) NEGATIVE   RBC / HPF 0-5 0 - 5 RBC/hpf   WBC, UA 21-50 0 - 5 WBC/hpf   Bacteria, UA MANY (A) NONE SEEN   Squamous Epithelial / LPF 0-5 0 - 5   Imaging Studies: MR ABDOMEN W WO CONTRAST  Result Date: 08/07/2022 CLINICAL DATA:  Cirrhosis. Possible left hepatic lobe mass on recent CT. EXAM: MRI ABDOMEN WITHOUT AND WITH CONTRAST TECHNIQUE: Multiplanar multisequence MR imaging of the abdomen was performed both  before and after the administration of intravenous contrast. CONTRAST:  74mL GADAVIST GADOBUTROL 1 MMOL/ML IV SOLN COMPARISON:  CT on 08/06/2022 FINDINGS: Lower chest: No acute findings. Hepatobiliary: Advanced macronodular hepatic cirrhosis is demonstrated. No hepatic masses are identified. Portal and hepatic veins are patent. Mild perihepatic ascites noted. Gallbladder is unremarkable. No evidence of biliary ductal dilatation. Pancreas:  No mass or inflammatory changes. Spleen:  Within normal limits in size and appearance. Adrenals/Urinary Tract: No suspicious masses identified. No evidence of hydronephrosis. Stomach/Bowel: Unremarkable. Vascular/Lymphatic: No pathologically enlarged lymph nodes identified. No acute vascular findings. Other:  None. Musculoskeletal:  No suspicious bone lesions identified. IMPRESSION: Advanced macronodular hepatic cirrhosis. No evidence of hepatocellular carcinoma or other acute findings. Electronically Signed   By: Danae Orleans M.D.   On: 08/07/2022 11:38   CT ABDOMEN PELVIS W CONTRAST  Result Date: 08/06/2022 CLINICAL DATA:  Abdominal pain, acute, nonlocalized. Abdominal pain and swelling. Hx of cirrhosis EXAM: CT ABDOMEN AND PELVIS WITH CONTRAST TECHNIQUE: Multidetector CT imaging of the abdomen and pelvis was  performed using the standard protocol following bolus administration of intravenous contrast. RADIATION DOSE REDUCTION: This exam was performed according to the departmental dose-optimization program which includes automated exposure control, adjustment of the mA and/or kV according to patient size and/or use of iterative reconstruction technique. CONTRAST:  OMNIPAQUE IOHEXOL 300 MG/ML  SOLN COMPARISON:  04/04/2022 FINDINGS: Lower chest: No acute abnormality. Hepatobiliary: Cirrhotic hepatic morphology. There is asymmetric contour nodularity within the left hepatic lobe which demonstrates subtle hypoenhancement on delayed phase images which may represent washout and is suspicious for a developing mass. This is best seen on axial image # 17/series 2 and image # 8/series 7. This is not optimally characterized on this examination. No intra or extrahepatic biliary ductal dilation. Cholelithiasis without pericholecystic inflammatory change again identified. Pancreas: Unremarkable Spleen: Unremarkable Adrenals/Urinary Tract: Adrenal glands are unremarkable. Kidneys are normal, without renal calculi, focal lesion, or hydronephrosis. Bladder is unremarkable. Stomach/Bowel: Stomach is within normal limits. Appendix absent. No evidence of bowel wall thickening, distention, or inflammatory changes. Previously noted ascites has resolved. No free intraperitoneal gas. Vascular/Lymphatic: Aortic atherosclerosis. No enlarged abdominal or pelvic lymph nodes. Reproductive: Status post hysterectomy. No adnexal masses. Other: No abdominal wall hernia. Musculoskeletal: No acute bone abnormality. Osseous structures are age-appropriate. IMPRESSION: 1. No acute intra-abdominal pathology identified. No definite radiographic explanation for the patient's reported symptoms. 2. Cirrhotic hepatic morphology. Asymmetric contour nodularity within the left hepatic lobe which demonstrates subtle hypoenhancement on delayed phase images which  may represent washout and is suspicious for a developing mass. This is not optimally characterized on this examination. Correlation with serum AFP level and contrast enhanced MRI examination is recommended for further evaluation. 3. Cholelithiasis. 4. Aortic atherosclerosis. Aortic Atherosclerosis (ICD10-I70.0). Electronically Signed   By: Helyn Numbers M.D.   On: 08/06/2022 19:08    ED COURSE and MDM  Nursing notes, initial and subsequent vitals signs, including pulse oximetry, reviewed and interpreted by myself.  Vitals:   08/07/22 0700 08/07/22 0900 08/07/22 0930 08/07/22 1200  BP: (!) 97/55 119/63 112/72 (!) 123/58  Pulse: 89 91 85 88  Resp: 18 18 18 18   Temp:  98.2 F (36.8 C)  98.5 F (36.9 C)  TempSrc:  Oral  Oral  SpO2: 95% 92% 95% 94%   Medications  iohexol (OMNIPAQUE) 300 MG/ML solution 100 mL (100 mLs Intravenous Contrast Given 08/06/22 1847)  fosfomycin (MONUROL) packet 3 g (3 g Oral Given 08/07/22 0731)  gadobutrol (GADAVIST) 1 MMOL/ML  injection 7 mL (7 mLs Intravenous Contrast Given 08/07/22 1125)   1:25 AM The cause of the patient's particular rash is unclear but her platelet count and coagulation factors are within normal limits.  Her abnormality on CT scan is concerning for a liver malignancy and we will obtain an MRI later this morning.  7:00 AM Signed out to Dr. Jacqulyn Bath. MRI pending pending  PROCEDURES  Procedures   ED DIAGNOSES     ICD-10-CM   1. Right upper quadrant abdominal pain  R10.11     2. Acute cystitis without hematuria  N30.00          Marshe Shrestha, MD 08/07/22 2234

## 2022-08-08 LAB — AFP TUMOR MARKER: AFP, Serum, Tumor Marker: 3.3 ng/mL (ref 0.0–9.2)

## 2022-08-09 ENCOUNTER — Other Ambulatory Visit: Payer: Self-pay

## 2022-08-09 LAB — URINE CULTURE: Culture: >=100000 — AB

## 2022-08-10 ENCOUNTER — Telehealth (HOSPITAL_BASED_OUTPATIENT_CLINIC_OR_DEPARTMENT_OTHER): Payer: Self-pay | Admitting: Emergency Medicine

## 2022-08-10 ENCOUNTER — Other Ambulatory Visit: Payer: Self-pay

## 2022-08-10 ENCOUNTER — Encounter: Payer: Self-pay | Admitting: Nurse Practitioner

## 2022-08-10 ENCOUNTER — Ambulatory Visit (INDEPENDENT_AMBULATORY_CARE_PROVIDER_SITE_OTHER): Payer: Self-pay | Admitting: Nurse Practitioner

## 2022-08-10 VITALS — BP 120/71 | HR 94 | Temp 97.6°F | Ht 67.0 in | Wt 140.0 lb

## 2022-08-10 DIAGNOSIS — N3000 Acute cystitis without hematuria: Secondary | ICD-10-CM

## 2022-08-10 LAB — POCT URINALYSIS DIP (CLINITEK)
Bilirubin, UA: NEGATIVE
Blood, UA: NEGATIVE
Glucose, UA: NEGATIVE mg/dL
Leukocytes, UA: NEGATIVE
Nitrite, UA: NEGATIVE
POC PROTEIN,UA: NEGATIVE
Spec Grav, UA: >=1.03 — AB (ref 1.010–1.025)
Urobilinogen, UA: 0.2 E.U./dL
pH, UA: 5.5 (ref 5.0–8.0)

## 2022-08-10 NOTE — Patient Instructions (Signed)
1. Acute cystitis without hematuria  - POCT URINALYSIS DIP (CLINITEK)  Follow up:  Follow up as scheduled with Dr. Joya Gaskins

## 2022-08-10 NOTE — Telephone Encounter (Signed)
Post ED Visit - Positive Culture Follow-up  Culture report reviewed by antimicrobial stewardship pharmacist: [place] [facility] []  Nathan Batchelder, Pharm.D. []  [id number], Pharm.D., BCPS AQ-ID []  Heide Guile, Pharm.D., BCPS []  Parks Neptune, Pharm.D., BCPS []  [place], Pharm.D., BCPS, AAHIVP []  [place], Pharm.D., BCPS, AAHIVP []  Legrand Como, PharmD, BCPS []  Salome Arnt, PharmD, BCPS []  Johnnette Gourd, PharmD, BCPS []  Hughes Better, PharmD []  Leeroy Cha, PharmD, BCPS []  Laqueta Linden, PharmD  [place] [facility] []  [address], PharmD []  Leodis Sias, PharmD []  Lindell Spar, PharmD []  Royetta Asal, Rph []  Graylin Shiver) Rema Fendt, PharmD []  Glennon Mac, PharmD []  Arlyn Dunning, PharmD []  Netta Cedars, PharmD []  Dia Sitter, PharmD []  Leone Haven, PharmD []  Gretta Arab, PharmD []  Theodis Shove, PharmD [x]  Peggyann Juba, PharmD   Positive urine culture Treated with Fosfomycin, organism sensitive to the same and no further patient follow-up is required at this time.  Angel [name] 08/10/2022, 9:53 AM

## 2022-08-10 NOTE — Progress Notes (Signed)
@Patient  ID: Angel [name], female    DOB: March 11, 1956, 66 y.o.   MRN: [id number]  Chief Complaint  Patient presents with   Follow-up    Pt is here for a hos f/u Pt complaining of lower abdominal pain     Referring provider: Elsie Stain, MD   HPI  Angel [name] is a 66 year old female who presents for follow up after a hospital admission. Patient with history of COPD, hyperlipidemia, hypertension, aortic arthrosclerosis, GERD and tobacco use.   Patient presents today for an ED follow-up.  Patient was seen in the ED on for right upper quadrant pain.  This is a patient of Dr. Joya Gaskins.  Patient does have a known history of liver cirrhosis and does have a follow-up scheduled with GI next month.  Patient was treated with antibiotic in the ED.  He was rechecked in office today and was negative.  She states that her abdominal pain has improved.  She is feeling much better now. Denies f/c/s, n/v/d, hemoptysis, PND, leg swelling Denies chest pain or edema     Allergies  Allergen Reactions   Penicillins Hives    Immunization History  Administered Date(s) Administered   Influenza,inj,Quad PF,6+ Mos 06/18/2022   PNEUMOCOCCAL CONJUGATE-20 06/18/2022   Pneumococcal Polysaccharide-23 12/31/2013    Past Medical History:  Diagnosis Date   Alcohol use 22/97/9892   Alcoholic cirrhosis (HCC)    Asthma    Bronchiolitis    COPD (chronic obstructive pulmonary disease) (HCC)    Hypertension     Tobacco History: Social History   Tobacco Use  Smoking Status Former   Packs/day: 0.50   Years: 30.00   Total pack years: 15.00   Types: Cigarettes   Quit date: 12/30/2013   Years since quitting: 8.6  Smokeless Tobacco Never   Counseling given: Not Answered   Outpatient Encounter Medications as of 08/10/2022  Medication Sig   albuterol (VENTOLIN HFA) 108 (90 Base) MCG/ACT inhaler Inhale 2 puffs into the lungs every 6 (six) hours as needed for wheezing or shortness of breath.    fluticasone furoate-vilanterol (BREO ELLIPTA) 100-25 MCG/ACT AEPB Inhale 1 puff into the lungs daily.   folic acid (FOLVITE) 1 MG tablet Take 1 tablet (1 mg total) by mouth daily.   furosemide (LASIX) 20 MG tablet TAKE 1 TABLET BY MOUTH DAILY   hydrOXYzine (ATARAX) 25 MG tablet TAKE 1 TABLET BY MOUTH ONCE A DAY AS NEEDED FOR ITCHING   ipratropium-albuterol (DUONEB) 0.5-2.5 (3) MG/3ML SOLN Take 3 mLs by nebulization every 6 (six) hours as needed.   lactulose (CHRONULAC) 10 GM/15ML solution Take 15 mLs (10 g total) by mouth 3 (three) times daily.   methocarbamol (ROBAXIN) 500 MG tablet Take 1 tablet (500 mg total) by mouth every 6 (six) hours as needed for muscle spasms.   Multiple Vitamins-Minerals (CENTRUM SILVER PO) Take 1 tablet by mouth daily.   omeprazole (PRILOSEC) 20 MG capsule Take 1 capsule (20 mg total) by mouth daily   OVER THE COUNTER MEDICATION Apply 1 application  topically 2 (two) times daily as needed (joint pain). Tiger Balm PO   OVER THE COUNTER MEDICATION Apply 1 Application topically 2 (two) times daily as needed (joint pain). Frankincense oil   predniSONE (DELTASONE) 10 MG tablet TAKE 1 TABLET BY MOUTH DAILY   spironolactone (ALDACTONE) 100 MG tablet Take 1 tablet (100 mg total) by mouth daily.   thiamine (VITAMIN B1) 100 MG tablet Take 1 tablet (100 mg total) by mouth  daily.   Trolamine Salicylate (ASPERCREME EX) Apply 1 Application topically See admin instructions. 1 application 3-4 times a day as needed for joint pain   Vitamin D, Ergocalciferol, (DRISDOL) 1.25 MG (50000 UNIT) CAPS capsule Take 1 capsule (50,000 Units total) by mouth every 7 (seven) days.   No facility-administered encounter medications on file as of 08/10/2022.     Review of Systems  Review of Systems  Constitutional: Negative.   HENT: Negative.    Cardiovascular: Negative.   Gastrointestinal: Negative.   Allergic/Immunologic: Negative.   Neurological: Negative.   Psychiatric/Behavioral: Negative.          Physical Exam  BP 120/71 (BP Location: Left Arm, Patient Position: Sitting, Cuff Size: Normal)   Pulse 94   Temp 97.6 F (36.4 C) (Oral)   Ht 5\' 7"  (1.702 m)   Wt 140 lb (63.5 kg)   SpO2 96%   BMI 21.93 kg/m   Wt Readings from Last 5 Encounters:  08/10/22 140 lb (63.5 kg)  06/18/22 140 lb 12.8 oz (63.9 kg)  04/19/22 135 lb 9.6 oz (61.5 kg)  04/11/22 152 lb (68.9 kg)  04/04/22 138 lb (62.6 kg)     Physical Exam Vitals and nursing note reviewed.  Constitutional:      General: She is not in acute distress.    Appearance: She is well-developed.  Cardiovascular:     Rate and Rhythm: Normal rate and regular rhythm.  Pulmonary:     Effort: Pulmonary effort is normal.     Breath sounds: Normal breath sounds.  Neurological:     Mental Status: She is alert and oriented to person, place, and time.      Lab Results:  CBC    Component Value Date/Time   WBC 7.2 08/06/2022 1735   RBC 4.70 08/06/2022 1735   HGB 15.3 (H) 08/06/2022 1735   HGB 14.7 04/19/2022 1207   HCT 45.1 08/06/2022 1735   HCT 41.3 04/19/2022 1207   PLT 171 08/06/2022 1735   PLT 190 04/19/2022 1207   MCV 96.0 08/06/2022 1735   MCV 89 04/19/2022 1207   MCH 32.6 08/06/2022 1735   MCHC 33.9 08/06/2022 1735   RDW 13.2 08/06/2022 1735   RDW 13.9 04/19/2022 1207   LYMPHSABS 1.4 08/06/2022 1735   LYMPHSABS 1.1 04/19/2022 1207   MONOABS 0.7 08/06/2022 1735   EOSABS 0.0 08/06/2022 1735   EOSABS 0.1 04/19/2022 1207   BASOSABS 0.0 08/06/2022 1735   BASOSABS 0.1 04/19/2022 1207    BMET    Component Value Date/Time   NA 133 (L) 08/06/2022 1735   NA 134 04/19/2022 1207   K 4.8 08/06/2022 1735   CL 99 08/06/2022 1735   CO2 26 08/06/2022 1735   GLUCOSE 181 (H) 08/06/2022 1735   BUN 23 08/06/2022 1735   BUN 13 04/19/2022 1207   CREATININE 1.26 (H) 08/06/2022 1735   CREATININE 0.79 02/19/2014 1226   CALCIUM 9.1 08/06/2022 1735   GFRNONAA 48 (L) 08/06/2022 1735   GFRNONAA 85 02/19/2014 1226    GFRAA >60 04/17/2019 0514   GFRAA >89 02/19/2014 1226    BNP No results found for: "BNP"  ProBNP    Component Value Date/Time   PROBNP 55.7 01/21/2014 1310    Imaging: MR ABDOMEN W WO CONTRAST  Result Date: 08/07/2022 CLINICAL DATA:  Cirrhosis. Possible left hepatic lobe mass on recent CT. EXAM: MRI ABDOMEN WITHOUT AND WITH CONTRAST TECHNIQUE: Multiplanar multisequence MR imaging of the abdomen was performed both before and after  the administration of intravenous contrast. CONTRAST:  77mL GADAVIST GADOBUTROL 1 MMOL/ML IV SOLN COMPARISON:  CT on 08/06/2022 FINDINGS: Lower chest: No acute findings. Hepatobiliary: Advanced macronodular hepatic cirrhosis is demonstrated. No hepatic masses are identified. Portal and hepatic veins are patent. Mild perihepatic ascites noted. Gallbladder is unremarkable. No evidence of biliary ductal dilatation. Pancreas:  No mass or inflammatory changes. Spleen:  Within normal limits in size and appearance. Adrenals/Urinary Tract: No suspicious masses identified. No evidence of hydronephrosis. Stomach/Bowel: Unremarkable. Vascular/Lymphatic: No pathologically enlarged lymph nodes identified. No acute vascular findings. Other:  None. Musculoskeletal:  No suspicious bone lesions identified. IMPRESSION: Advanced macronodular hepatic cirrhosis. No evidence of hepatocellular carcinoma or other acute findings. Electronically Signed   By: Danae Orleans M.D.   On: 08/07/2022 11:38   CT ABDOMEN PELVIS W CONTRAST  Result Date: 08/06/2022 CLINICAL DATA:  Abdominal pain, acute, nonlocalized. Abdominal pain and swelling. Hx of cirrhosis EXAM: CT ABDOMEN AND PELVIS WITH CONTRAST TECHNIQUE: Multidetector CT imaging of the abdomen and pelvis was performed using the standard protocol following bolus administration of intravenous contrast. RADIATION DOSE REDUCTION: This exam was performed according to the departmental dose-optimization program which includes automated exposure  control, adjustment of the mA and/or kV according to patient size and/or use of iterative reconstruction technique. CONTRAST:  OMNIPAQUE IOHEXOL 300 MG/ML  SOLN COMPARISON:  04/04/2022 FINDINGS: Lower chest: No acute abnormality. Hepatobiliary: Cirrhotic hepatic morphology. There is asymmetric contour nodularity within the left hepatic lobe which demonstrates subtle hypoenhancement on delayed phase images which may represent washout and is suspicious for a developing mass. This is best seen on axial image # 17/series 2 and image # 8/series 7. This is not optimally characterized on this examination. No intra or extrahepatic biliary ductal dilation. Cholelithiasis without pericholecystic inflammatory change again identified. Pancreas: Unremarkable Spleen: Unremarkable Adrenals/Urinary Tract: Adrenal glands are unremarkable. Kidneys are normal, without renal calculi, focal lesion, or hydronephrosis. Bladder is unremarkable. Stomach/Bowel: Stomach is within normal limits. Appendix absent. No evidence of bowel wall thickening, distention, or inflammatory changes. Previously noted ascites has resolved. No free intraperitoneal gas. Vascular/Lymphatic: Aortic atherosclerosis. No enlarged abdominal or pelvic lymph nodes. Reproductive: Status post hysterectomy. No adnexal masses. Other: No abdominal wall hernia. Musculoskeletal: No acute bone abnormality. Osseous structures are age-appropriate. IMPRESSION: 1. No acute intra-abdominal pathology identified. No definite radiographic explanation for the patient's reported symptoms. 2. Cirrhotic hepatic morphology. Asymmetric contour nodularity within the left hepatic lobe which demonstrates subtle hypoenhancement on delayed phase images which may represent washout and is suspicious for a developing mass. This is not optimally characterized on this examination. Correlation with serum AFP level and contrast enhanced MRI examination is recommended for further evaluation. 3.  Cholelithiasis. 4. Aortic atherosclerosis. Aortic Atherosclerosis (ICD10-I70.0). Electronically Signed   By: Helyn Numbers M.D.   On: 08/06/2022 19:08     Assessment & Plan:   1. Acute cystitis without hematuria  - POCT URINALYSIS DIP (CLINITEK)  Follow up:  Follow up as scheduled with Dr. Alden Hipp, NP 08/10/2022

## 2022-08-14 ENCOUNTER — Other Ambulatory Visit: Payer: Self-pay

## 2022-08-16 ENCOUNTER — Other Ambulatory Visit: Payer: Self-pay

## 2022-08-29 ENCOUNTER — Other Ambulatory Visit: Payer: Self-pay

## 2022-08-31 ENCOUNTER — Other Ambulatory Visit: Payer: Self-pay

## 2022-09-03 ENCOUNTER — Other Ambulatory Visit: Payer: Self-pay

## 2022-09-06 ENCOUNTER — Other Ambulatory Visit: Payer: Self-pay

## 2022-09-06 MED ORDER — PREDNISONE 5 MG PO TABS
5.0000 mg | ORAL_TABLET | Freq: Every day | ORAL | 1 refills | Status: DC
  Filled 2022-09-06: qty 30, 30d supply, fill #0
  Filled 2022-10-02: qty 30, 30d supply, fill #1
  Filled 2022-11-08 – 2022-11-30 (×2): qty 30, 30d supply, fill #2
  Filled 2023-01-02: qty 30, 30d supply, fill #3
  Filled 2023-02-08 – 2023-02-21 (×2): qty 30, 30d supply, fill #4
  Filled 2023-03-26: qty 30, 30d supply, fill #5

## 2022-09-06 MED ORDER — AZATHIOPRINE 50 MG PO TABS
50.0000 mg | ORAL_TABLET | Freq: Every day | ORAL | 1 refills | Status: DC
  Filled 2022-09-06: qty 90, 90d supply, fill #0

## 2022-09-13 ENCOUNTER — Other Ambulatory Visit: Payer: Self-pay

## 2022-09-13 ENCOUNTER — Other Ambulatory Visit: Payer: Self-pay | Admitting: Critical Care Medicine

## 2022-09-13 MED ORDER — FOLIC ACID 1 MG PO TABS
1.0000 mg | ORAL_TABLET | Freq: Every day | ORAL | 0 refills | Status: DC
  Filled 2022-09-13: qty 30, 30d supply, fill #0

## 2022-09-16 NOTE — Progress Notes (Unsigned)
Established [name] Office Visit  Subjective   [name] ID: Angel [name], female    DOB: 07-21-1956  Age: 66 y.o. MRN: [id number]  No chief complaint on file.   TOC OV d/c is < 7 days from OV , RN TOC OV has occured 7/13//23 Angel [name] is a 66 year old female who presents for follow up after a hospital admission. [name] with history of COPD, hyperlipidemia, hypertension, aortic arthrosclerosis, GERD and tobacco use. She was admitted from 7/05-7/07 for liver cirrhosis with ascites, [name] had no prior knowledge of this condition.  Reports she has not drank alcohol in 2 weeks.   While admitted she had 3L of fluid taken off and was started on furesomide and aldactone daily. She was told to discontinue her daily statin and hypertensive medication (amlodipine). Labs also showed a low vitamin D level, and elevated iron. She was referred to [facility] for further workup and EGD. States she has made this appointment.   Currently she is managing her COPD with albuterol, advair and a duonebulizer. She feels like it is well controlled. She is still smoking cigarettes but reports she has cutdown recently as she has a roommate now and is not allowed to.   In addition, she reports a left lower leg/foot pain and rash that began prior to hospital admission. She was started on oral Bactrim following admission and is to continue til 7/21. Also states intermittent cramping of her bilateral lower extremities, worse at night. This has been ongoing for over 6 months per [name]. She is unsure of any aggravating or alleviating factors.   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    Tests Needing Follow-up: -Check electrolytes with [name] newly placed on Lasix and Aldactone -Assure [name] continues to abstain from alcohol -Follow-up pending results from serologic cirrhosis work-up   Discharge Diagnoses: Newly diagnosed cirrhosis of Angel liver  with ascites Elevated iron Elevated F-Actin IgG Moderate Malnutrition related to liver cirrhosis Thrombocytopenia COPD HLD   Initial presentation: 66 year old with a history of COPD, alcohol abuse, cirrhosis of Angel liver, HTN, and HLD who presented to Angel ER with abdominal pain which has been worsening over a 3-week.  And associated with distention in Angel abdomen.   Hospital Course:   Newly diagnosed cirrhosis of Angel liver with ascites No evidence of SBP on ascites fluid testing status post paracentesis removing 3 L - diuretics initiated - plan for outpatient EGD for variceal screening - full work-up to rule out other potential etiologies underway with some serologic studies pending at time of d/c - will be addressed in f/u w/ GI service - acute viral hepatitis panel negative -[name] has been counseled numerous times as to Angel absolute need to fully abstain from alcohol permanently   Elevated iron Iron level noted to be modestly elevated at 221 -work-up underway -for outpatient follow-up in GI office   Elevated F-Actin IgG ?autoimmune hepatitis - GI following    Moderate Malnutrition related to liver cirrhosis as evidenced by energy intake < or equal to 75% for > or equal to 1 month & mild fat depletion   Thrombocytopenia Due to liver disease/splenic sequestration -platelet count stable with no evidence of spontaneous bleeding.  His hospital stay   Alcohol abuse [name] counseled at great length about Angel absolute need to avoid alcohol completely going forward and Angel resumption of alcohol abuse would lead to liver failure and death   COPD  Well compensated at present -continue usual home medications   HLD Statin on hold due to elevated LFTs   9/11 [name] is seen in return follow-up for alcohol induced cirrhotic liver disease hypertension aortic atherosclerosis and COPD with bronchiectasis.  Angel [name] has been smoking about a pack of cigarettes every 6 days but is no longer  drinking alcohol.  She did have a bout of COVID in August and I did prescribe an oral antiviral for her and she is improving from this.  Angel [name] agrees to receive a tetanus vaccine through [name] assistance and a flu vaccine and Prevnar 20 today.  She needs a mammogram.  Angel [name] also will need colon cancer screening at some point.  On arrival blood pressure 129/78.  She has no other real complaints.  Note we have been holding statin therapy because of elevated liver functions and will continue to hold them for now.  Liver functions at her gastroenterologist have come down into Angel mid 70 range.  12/12  Hypertension currently at goal no change in medications       Aortic atherosclerosis (HCC)      Ideally would benefit from statin therapy but need to hold off for now         Respiratory   COPD (chronic obstructive pulmonary disease) (HCC)      Continue inhaled medications         Digestive   Alcoholic cirrhosis of liver with ascites (HCC)      Cirrhotic liver disease follow-up per gastroenterology         [name] Active Problem List   Diagnosis Date Noted   Aortic atherosclerosis (HCC) 04/19/2022   Vitamin D deficiency 04/19/2022   Encounter for health-related screening 04/19/2022   Iron overload 04/19/2022   Cirrhosis (HCC) 04/12/2022   Malnutrition of moderate degree 04/12/2022   HTN (hypertension) 04/11/2022   HLD (hyperlipidemia) 04/11/2022   Hypoalbuminemia 04/11/2022   Alcoholic cirrhosis of liver with ascites (HCC) 04/11/2022   Bronchiectasis without acute exacerbation (HCC) 03/06/2014   COPD (chronic obstructive pulmonary disease) (HCC) 02/19/2014   Environmental allergies 02/19/2014   Smoker 12/31/2013   Past Medical History:  Diagnosis Date   Alcohol use 04/19/2022   Alcoholic cirrhosis (HCC)    Asthma    Bronchiolitis    COPD (chronic obstructive pulmonary disease) (HCC)    Hypertension    Past Surgical History:  Procedure Laterality Date    ABDOMINAL HYSTERECTOMY     APPENDECTOMY     BACK SURGERY     Social History   Tobacco Use   Smoking status: Former    Packs/day: 0.50    Years: 30.00    Total pack years: 15.00    Types: Cigarettes    Quit date: 12/30/2013    Years since quitting: 8.7   Smokeless tobacco: Never  Substance Use Topics   Alcohol use: Yes    Alcohol/week: 28.0 standard drinks of alcohol    Types: 28 Glasses of wine per week   Drug use: Yes    Frequency: 3.0 times per week    Types: Marijuana   Family History  Problem Relation Age of Onset   CAD Father    Heart disease Father    Colon cancer Mother    Breast cancer Sister    Emphysema Paternal Grandfather        never smoker, Theatre stage manager   Allergies  Allergen Reactions   Penicillins Hives      Review of Systems  Constitutional:  Positive for malaise/fatigue. Negative for chills, diaphoresis, fever and weight loss.  HENT:  Negative for congestion, hearing loss, nosebleeds, sore throat and tinnitus.   Eyes:  Negative for blurred vision, photophobia and redness.  Respiratory:  Negative for cough, hemoptysis, sputum production, shortness of breath, wheezing and stridor.   Cardiovascular:  Negative for chest pain, palpitations, orthopnea, claudication, leg swelling and PND.  Gastrointestinal:  Negative for abdominal pain, blood in stool, constipation, diarrhea, heartburn, nausea and vomiting.  Genitourinary:  Negative for dysuria, flank pain, frequency, hematuria and urgency.  Musculoskeletal:  Negative for back pain, falls, joint pain, myalgias and neck pain.  Skin:  Negative for itching and rash.  Neurological:  Negative for dizziness, tingling, tremors, sensory change, speech change, focal weakness, seizures, loss of consciousness, weakness and headaches.  Endo/Heme/Allergies:  Negative for environmental allergies and polydipsia. Does not bruise/bleed easily.  Psychiatric/Behavioral:  Negative for depression, memory loss, substance abuse  and suicidal ideas. Angel [name] is not nervous/anxious and does not have insomnia.   All other systems reviewed and are negative.     Objective:     There were no vitals taken for this visit.   Physical Exam Constitutional:      Appearance: Normal appearance.  HENT:     Head: Normocephalic.     Right Ear: Tympanic membrane, ear canal and external ear normal.     Left Ear: Tympanic membrane, ear canal and external ear normal.     Nose: Nose normal.     Mouth/Throat:     Mouth: Mucous membranes are moist.     Pharynx: Oropharynx is clear.  Eyes:     Pupils: Pupils are equal, round, and reactive to light.  Cardiovascular:     Rate and Rhythm: Normal rate and regular rhythm.     Pulses: Normal pulses.     Heart sounds: Normal heart sounds.  Pulmonary:     Breath sounds: No wheezing.  Abdominal:     Palpations: Abdomen is soft.     Tenderness: There is no abdominal tenderness.  Lymphadenopathy:     Cervical: No cervical adenopathy.  Skin:    General: Skin is warm.     Findings: No rash.  Neurological:     General: No focal deficit present.     Mental Status: She is alert and oriented to person, place, and time.  Psychiatric:        Mood and Affect: Mood normal.        Behavior: Behavior normal.      No results found for any visits on 09/18/22.    Angel ASCVD Risk score (Arnett DK, et al., 2019) failed to calculate for Angel following reasons:   Angel valid total cholesterol range is 130 to 320 mg/dL    Assessment & Plan:   Problem List Items Addressed This Visit   None Screening mammogram will be ordered through [name] assistance program and note [name] not getting a Pap smear as she does not have a cervix  Gastroenterology has going to follow-up with colonoscopy 38 minutes spent complex decision making is high No follow-ups on file.    Shan Levans, MD

## 2022-09-18 ENCOUNTER — Encounter: Payer: Self-pay | Admitting: Critical Care Medicine

## 2022-09-18 ENCOUNTER — Other Ambulatory Visit: Payer: Self-pay

## 2022-09-18 ENCOUNTER — Ambulatory Visit: Payer: Self-pay | Attending: Critical Care Medicine | Admitting: Critical Care Medicine

## 2022-09-18 VITALS — BP 127/80 | HR 93 | Temp 98.2°F | Wt 142.0 lb

## 2022-09-18 DIAGNOSIS — E440 Moderate protein-calorie malnutrition: Secondary | ICD-10-CM

## 2022-09-18 DIAGNOSIS — E559 Vitamin D deficiency, unspecified: Secondary | ICD-10-CM

## 2022-09-18 DIAGNOSIS — K7031 Alcoholic cirrhosis of liver with ascites: Secondary | ICD-10-CM

## 2022-09-18 DIAGNOSIS — Z28310 Unvaccinated for covid-19: Secondary | ICD-10-CM | POA: Insufficient documentation

## 2022-09-18 DIAGNOSIS — F17210 Nicotine dependence, cigarettes, uncomplicated: Secondary | ICD-10-CM | POA: Insufficient documentation

## 2022-09-18 DIAGNOSIS — I10 Essential (primary) hypertension: Secondary | ICD-10-CM

## 2022-09-18 DIAGNOSIS — E8809 Other disorders of plasma-protein metabolism, not elsewhere classified: Secondary | ICD-10-CM

## 2022-09-18 DIAGNOSIS — J441 Chronic obstructive pulmonary disease with (acute) exacerbation: Secondary | ICD-10-CM

## 2022-09-18 DIAGNOSIS — Z8616 Personal history of COVID-19: Secondary | ICD-10-CM | POA: Insufficient documentation

## 2022-09-18 DIAGNOSIS — Z6836 Body mass index (BMI) 36.0-36.9, adult: Secondary | ICD-10-CM | POA: Insufficient documentation

## 2022-09-18 DIAGNOSIS — F17200 Nicotine dependence, unspecified, uncomplicated: Secondary | ICD-10-CM

## 2022-09-18 DIAGNOSIS — Z79899 Other long term (current) drug therapy: Secondary | ICD-10-CM | POA: Insufficient documentation

## 2022-09-18 DIAGNOSIS — K219 Gastro-esophageal reflux disease without esophagitis: Secondary | ICD-10-CM | POA: Insufficient documentation

## 2022-09-18 DIAGNOSIS — E785 Hyperlipidemia, unspecified: Secondary | ICD-10-CM | POA: Insufficient documentation

## 2022-09-18 MED ORDER — METHOCARBAMOL 500 MG PO TABS
500.0000 mg | ORAL_TABLET | Freq: Four times a day (QID) | ORAL | 1 refills | Status: DC | PRN
  Filled 2022-09-18 – 2023-01-02 (×2): qty 60, 15d supply, fill #0
  Filled 2023-02-27 – 2023-03-14 (×3): qty 60, 15d supply, fill #1

## 2022-09-18 MED ORDER — OMEPRAZOLE 20 MG PO CPDR
20.0000 mg | DELAYED_RELEASE_CAPSULE | Freq: Every day | ORAL | 2 refills | Status: DC
  Filled 2022-09-18: qty 60, 60d supply, fill #0
  Filled 2022-10-02: qty 30, 30d supply, fill #0
  Filled 2022-11-08 – 2022-11-30 (×2): qty 30, 30d supply, fill #1

## 2022-09-18 MED ORDER — ALBUTEROL SULFATE HFA 108 (90 BASE) MCG/ACT IN AERS
2.0000 | INHALATION_SPRAY | Freq: Four times a day (QID) | RESPIRATORY_TRACT | 1 refills | Status: DC | PRN
  Filled 2022-09-18: qty 18, 25d supply, fill #0

## 2022-09-18 MED ORDER — SPIRONOLACTONE 100 MG PO TABS
100.0000 mg | ORAL_TABLET | Freq: Every day | ORAL | 3 refills | Status: DC
  Filled 2022-09-18 – 2022-09-24 (×2): qty 30, 30d supply, fill #0
  Filled 2022-10-24: qty 30, 30d supply, fill #1
  Filled 2022-11-28: qty 30, 30d supply, fill #2

## 2022-09-18 MED ORDER — LACTULOSE 10 GM/15ML PO SOLN
10.0000 g | Freq: Three times a day (TID) | ORAL | 2 refills | Status: DC
  Filled 2022-09-18 – 2022-11-30 (×3): qty 1419, 32d supply, fill #0
  Filled 2023-01-25: qty 1419, 32d supply, fill #1
  Filled 2023-03-26: qty 1419, 32d supply, fill #2

## 2022-09-18 MED ORDER — VITAMIN D (ERGOCALCIFEROL) 1.25 MG (50000 UNIT) PO CAPS
50000.0000 [IU] | ORAL_CAPSULE | ORAL | 5 refills | Status: DC
  Filled 2022-09-18 – 2022-10-02 (×2): qty 12, 84d supply, fill #0

## 2022-09-18 MED ORDER — HYDROXYZINE HCL 25 MG PO TABS
25.0000 mg | ORAL_TABLET | Freq: Every day | ORAL | 2 refills | Status: DC | PRN
  Filled 2022-09-18 – 2022-09-24 (×2): qty 30, 30d supply, fill #0
  Filled 2022-12-25: qty 30, 30d supply, fill #1
  Filled 2023-02-20: qty 30, 30d supply, fill #2

## 2022-09-18 MED ORDER — FLUTICASONE FUROATE-VILANTEROL 100-25 MCG/ACT IN AEPB
1.0000 | INHALATION_SPRAY | Freq: Every day | RESPIRATORY_TRACT | 11 refills | Status: DC
  Filled 2022-09-18: qty 60, 30d supply, fill #0

## 2022-09-18 MED ORDER — IPRATROPIUM-ALBUTEROL 0.5-2.5 (3) MG/3ML IN SOLN
3.0000 mL | Freq: Four times a day (QID) | RESPIRATORY_TRACT | 0 refills | Status: DC | PRN
  Filled 2022-09-18 – 2023-06-25 (×2): qty 360, 30d supply, fill #0

## 2022-09-18 MED ORDER — COMIRNATY 30 MCG/0.3ML IM SUSY
PREFILLED_SYRINGE | INTRAMUSCULAR | 0 refills | Status: DC
  Filled 2022-09-18: qty 0.3, 30d supply, fill #0

## 2022-09-18 MED ORDER — FUROSEMIDE 20 MG PO TABS
20.0000 mg | ORAL_TABLET | Freq: Every day | ORAL | 4 refills | Status: DC
  Filled 2022-09-18 – 2022-09-24 (×2): qty 30, 30d supply, fill #0
  Filled 2022-12-25: qty 30, 30d supply, fill #1
  Filled 2023-01-25: qty 30, 30d supply, fill #2
  Filled 2023-02-27 – 2023-03-14 (×3): qty 30, 30d supply, fill #3

## 2022-09-18 NOTE — Patient Instructions (Signed)
Outside records will be obtained from the stomach doctor  We will get you the mammogram with scholarship application you will fill out today  All medications were refilled no change in medication dosing  We will see if we can get you a COVID-vaccine for free through one of our pharmacies  Return to Dr. Delford Field 4 months  Keep up the good work with your alcohol abstinence

## 2022-09-18 NOTE — Assessment & Plan Note (Signed)
Recent albumin improved

## 2022-09-18 NOTE — Assessment & Plan Note (Signed)
Continue with inhaled medications no changes made

## 2022-09-18 NOTE — Assessment & Plan Note (Signed)
Hypertension well controlled continue current medications refills given °

## 2022-09-18 NOTE — Assessment & Plan Note (Signed)
Monitor per gastroenterology

## 2022-09-18 NOTE — Assessment & Plan Note (Signed)
Patient is eating better

## 2022-09-18 NOTE — Assessment & Plan Note (Signed)
    Current smoking consumption amount: 1/2 ppd  Dicsussion on advise to quit smoking and smoking impacts: cv lung impacts  Patient's willingness to quit:  May quit  Methods to quit smoking discussed:  behav mod, nicotine replacement lozenge  Medication management of smoking session drugs discussed:nicotine replacement  Resources provided:  AVS   Setting quit date not established  Follow-up arranged 1 mo   Time spent counseling the patient:  5 min

## 2022-09-18 NOTE — Assessment & Plan Note (Signed)
-   Continue with vitamin D supplementation

## 2022-09-18 NOTE — Assessment & Plan Note (Signed)
Cirrhotic liver disease continue management with gastroenterology

## 2022-09-25 ENCOUNTER — Other Ambulatory Visit: Payer: Self-pay

## 2022-09-26 ENCOUNTER — Other Ambulatory Visit: Payer: Self-pay

## 2022-09-26 MED ORDER — COVID-19 MRNA VAC-TRIS(PFIZER) 30 MCG/0.3ML IM SUSY
0.3000 mL | PREFILLED_SYRINGE | Freq: Once | INTRAMUSCULAR | 0 refills | Status: AC
  Filled 2022-09-26: qty 0.3, 1d supply, fill #0

## 2022-10-02 ENCOUNTER — Other Ambulatory Visit: Payer: Self-pay

## 2022-10-03 ENCOUNTER — Other Ambulatory Visit: Payer: Self-pay

## 2022-10-03 ENCOUNTER — Telehealth: Payer: Self-pay

## 2022-10-03 NOTE — Telephone Encounter (Signed)
Telephoned patient at home number. Left a voice message with [facility] (scholarship) contact information. 

## 2022-10-05 ENCOUNTER — Other Ambulatory Visit: Payer: Self-pay

## 2022-10-10 ENCOUNTER — Ambulatory Visit: Payer: Self-pay

## 2022-10-10 NOTE — Telephone Encounter (Signed)
Message from Nani Ravens sent at 10/10/2022 12:19 PM EST  Summary: right ear pain   Pt called In to be advised. Pt says that she is experiencing some ear pain in her right ear. Pt says that she would like to know if there is something OTC or could the provider send in something that could help?  Pharmacy: [facility] Phone: [phone] Fax: [phone]         Chief Complaint: right ear pain, mild swelling to "bone"in front of the ear, jaw pain when eating Symptoms: moderate earache, runny nose Frequency: Friday Pertinent Negatives: Patient denies stiff neck, dizziness, vomiting, decreased hearing Disposition: [] ED /[] Urgent Care (no appt availability in office) / [] Appointment(In office/virtual)/ []  [place] [facility]/ [] Home Care/ [x] Refused Recommended Disposition /[] [place] [facility]/ []  Follow-up with PCP Additional Notes: pt refused in office appt,[facility] - No appts in office available. Pt asked of can get OTC advice to try instead. Advised pt that the ear will need to be looked at and ear drum examined. Pt stated she does not have any money to go to [facility].   Reason for Disposition  Earache  (Exceptions: brief ear pain of < 60 minutes duration, earache occurring during air travel  Answer Assessment - Initial Assessment Questions 1. LOCATION: "Which ear is involved?"     right 2. ONSET: "When did the ear start hurting"      Friday started itching and Sunday 3. SEVERITY: "How bad is the pain?"  (Scale 1-10; mild, moderate or severe)   - MILD (1-3): doesn't interfere with normal activities    - MODERATE (4-7): interferes with normal activities or awakens from sleep    - SEVERE (8-10): excruciating pain, unable to do any normal activities      moderate 4. URI SYMPTOMS: "Do you have a runny nose or cough?"     no 5. FEVER: "Do you have a fever?" If Yes, ask: "What is your temperature, how was it measured, and when did it  start?"     no 6. CAUSE: "Have you been swimming recently?", "How often do you use Q-TIPS?", "Have you had any recent air travel or scuba diving?"     Uses Q tips 7. OTHER SYMPTOMS: "Do you have any other symptoms?" (e.g., headache, stiff neck, dizziness, vomiting, runny nose, decreased hearing)    Swelling to side of face- 8. PREGNANCY: "Is there any chance you are pregnant?" "When was your last menstrual period?"     N/a  Protocols used: Bethann Punches

## 2022-10-16 ENCOUNTER — Other Ambulatory Visit: Payer: Self-pay | Admitting: Critical Care Medicine

## 2022-10-17 ENCOUNTER — Other Ambulatory Visit: Payer: Self-pay

## 2022-10-17 MED ORDER — FOLIC ACID 1 MG PO TABS
1.0000 mg | ORAL_TABLET | Freq: Every day | ORAL | 0 refills | Status: DC
  Filled 2022-10-17 – 2022-10-25 (×2): qty 90, 90d supply, fill #0

## 2022-10-17 NOTE — Telephone Encounter (Signed)
Requested Prescriptions  Pending Prescriptions Disp Refills   folic acid (FOLVITE) 1 MG tablet 90 tablet 1    Sig: Take 1 tablet (1 mg total) by mouth daily.     Endocrinology:  Vitamins Passed - 10/16/2022 10:09 PM      Passed - Valid encounter within last 12 months    Recent Outpatient Visits           4 weeks ago Alcoholic cirrhosis of liver with ascites [facility])   [place] Elsie Stain, MD   4 months ago Alcoholic cirrhosis of liver with ascites [facility])   [place] Elsie Stain, MD   6 months ago Alcoholic cirrhosis of liver with ascites [facility])   [place] Elsie Stain, MD   3 years ago Hospital discharge follow-up   [place], Zelda W, NP   8 years ago COPD (chronic obstructive pulmonary disease)   [place] Lorayne Marek, MD       Future Appointments             In 3 months Joya Gaskins Burnett Harry, MD [place]

## 2022-10-23 ENCOUNTER — Other Ambulatory Visit: Payer: Self-pay

## 2022-10-24 ENCOUNTER — Other Ambulatory Visit: Payer: Self-pay

## 2022-10-25 ENCOUNTER — Other Ambulatory Visit: Payer: Self-pay

## 2022-10-25 ENCOUNTER — Ambulatory Visit
Admission: RE | Admit: 2022-10-25 | Discharge: 2022-10-25 | Disposition: A | Discharge status: Home / Self Care | Payer: No Typology Code available for payment source | Source: Ambulatory Visit | Attending: Critical Care Medicine | Admitting: Critical Care Medicine

## 2022-10-25 DIAGNOSIS — Z1231 Encounter for screening mammogram for malignant neoplasm of breast: Secondary | ICD-10-CM

## 2022-10-28 NOTE — Progress Notes (Signed)
Let pt know breast center will call her to schedule additional imaging of right breast doubt it is cancer

## 2022-10-29 ENCOUNTER — Other Ambulatory Visit: Payer: Self-pay | Admitting: Critical Care Medicine

## 2022-10-29 DIAGNOSIS — R928 Other abnormal and inconclusive findings on diagnostic imaging of breast: Secondary | ICD-10-CM

## 2022-10-30 ENCOUNTER — Telehealth: Payer: Self-pay

## 2022-10-30 NOTE — Telephone Encounter (Signed)
Called patient and she is aware and has a appointment

## 2022-10-30 NOTE — Telephone Encounter (Signed)
-----  Message from Elsie Stain, MD sent at 10/28/2022  2:55 PM EST ----- Let pt know breast center will call her to schedule additional imaging of right breast doubt it is cancer

## 2022-11-05 ENCOUNTER — Ambulatory Visit
Admission: RE | Admit: 2022-11-05 | Discharge: 2022-11-05 | Disposition: A | Discharge status: Home / Self Care | Payer: No Typology Code available for payment source | Source: Ambulatory Visit | Attending: Critical Care Medicine | Admitting: Critical Care Medicine

## 2022-11-05 DIAGNOSIS — R928 Other abnormal and inconclusive findings on diagnostic imaging of breast: Secondary | ICD-10-CM

## 2022-11-06 ENCOUNTER — Encounter: Payer: Self-pay | Admitting: Critical Care Medicine

## 2022-11-06 ENCOUNTER — Other Ambulatory Visit: Payer: Self-pay | Admitting: Critical Care Medicine

## 2022-11-06 DIAGNOSIS — N6091 Unspecified benign mammary dysplasia of right breast: Secondary | ICD-10-CM | POA: Insufficient documentation

## 2022-11-06 DIAGNOSIS — R92321 Mammographic fibroglandular density, right breast: Secondary | ICD-10-CM

## 2022-11-06 NOTE — Progress Notes (Signed)
Let patient know I received the results of her diagnostic mammogram on the right and the impression is that this is a benign fibrous tissue in the right outer breast they do recommend another diagnostic mammogram in 6 months and they will schedule that from the breast center

## 2022-11-07 ENCOUNTER — Telehealth: Payer: Self-pay

## 2022-11-07 NOTE — Telephone Encounter (Signed)
-----  Message from Elsie Stain, MD sent at 11/06/2022  8:02 AM EST ----- Let patient know I received the results of her diagnostic mammogram on the right and the impression is that this is a benign fibrous tissue in the right outer breast they do recommend another diagnostic mammogram in 6 months and they will schedule that from the breast center

## 2022-11-07 NOTE — Telephone Encounter (Signed)
Pt was called and vm was left, Information has been sent to nurse pool.   

## 2022-11-08 ENCOUNTER — Other Ambulatory Visit: Payer: Self-pay

## 2022-11-08 NOTE — Telephone Encounter (Signed)
Pt has received results. Is satisfied as spoke in depth with radiologist, states thinks for the fu but is satisfied just to wait a do fu appt. [phone]

## 2022-11-08 NOTE — Progress Notes (Signed)
Left VM to call back for results.

## 2022-11-09 ENCOUNTER — Other Ambulatory Visit: Payer: Self-pay

## 2022-11-15 ENCOUNTER — Other Ambulatory Visit: Payer: Self-pay

## 2022-11-30 ENCOUNTER — Other Ambulatory Visit: Payer: Self-pay

## 2022-11-30 ENCOUNTER — Ambulatory Visit: Payer: Self-pay | Admitting: *Deleted

## 2022-11-30 NOTE — Telephone Encounter (Signed)
Ok to double book sooner

## 2022-11-30 NOTE — Telephone Encounter (Signed)
Earliest appointment is 01/08/2023, Are you willing to double book this patient for an earlier appointment.

## 2022-11-30 NOTE — Telephone Encounter (Signed)
Message from Marlis Edelson sent at 11/30/2022  9:51 AM EST  Summary: health concern / lack of appetite   The patient shares that they are concerned with their history of liver disease and immune system  The patient shares that they have been experiencing a lack of appetite and would like to speak with a member of clinical staff further  The patient has experienced their concerns for roughly two weeks  Please contact when possible          Call History   Type Contact Phone/Fax User  11/30/2022 09:49 AM EST Phone (Incoming) Angel [name], Angel J "[name]" (Self) [phone] Jerilynn Mages) Lenon Curt, Everette A   Reason for Disposition . Earache < 60 minutes duration that is now completely gone    Chronic problem where right ear flares up intermittently.  Wanted an appt. With Dr. Joya Gaskins to discuss her liver disease and poor appetite.  Answer Assessment - Initial Assessment Questions 1. LOCATION: "Which ear is involved?"   Right ear has been bothering me for a long time.   It's crusty and that comes and goes.   It's always my right ear that bothers me.  Then she changed subjects and began talking about numerous other issues;  poor appetite, nausea, liver disease, increased itching, tired and requesting blood work for her liver.  I stopped and her and asked what is the most pressing symptom that is bothering you.    The poor appetite and worried about her liver disease.  "I just want an appt. Sooner with Dr. Joya Gaskins, I only want to see Dr. Joya Gaskins, than the one I have on 01/15/2023.    "I just want to talk with him about several issues".   "It's nothing urgent but I'm concerned and just want to know if I can be seen sooner".      I let her know I would send a message to [facility] to see if she can be worked in sooner with Dr. Asencion Noble and have someone call her back.   She was agreeable to this.   No further triage needed.       2. ONSET: "When did the ear start hurting"       It's been bothering me on and off for a very long time.    Dr. Joya Gaskins knows about it.   "My right ear is always the problem" 3. SEVERITY: "How bad is the pain?"  (Scale 1-10; mild, moderate or severe)   - MILD (1-3): doesn't interfere with normal activities    - MODERATE (4-7): interferes with normal activities or awakens from sleep    - SEVERE (8-10): excruciating pain, unable to do any normal activities      Mild  4. URI SYMPTOMS: "Do you have a runny nose or cough?"     Not asked 5. FEVER: "Do you have a fever?" If Yes, ask: "What is your temperature, how was it measured, and when did it start?"     Not asked 6. CAUSE: "Have you been swimming recently?", "How often do you use Q-TIPS?", "Have you had any recent air travel or scuba diving?"     Not asked 7. OTHER SYMPTOMS: "Do you have any other symptoms?" (e.g., headache, stiff neck, dizziness, vomiting, runny nose, decreased hearing)     See above.     Multiple issues she wants to talk with Dr. Joya Gaskins about. 8. PREGNANCY: "Is there any chance you are pregnant?" "When was your last menstrual  period?"     N/A due to age  Protocols used: Earache-A-AH

## 2022-11-30 NOTE — Telephone Encounter (Signed)
  Chief Complaint: Wants a sooner appt. With Dr. Joya Gaskins than 01/15/2023.    Wants to discuss several issues with him.   Requests to only see Dr. Joya Gaskins. Symptoms: poor appetite, nausea, tired, wanting blood work done for her liver disease Frequency: Approximately 2 weeks the poor appetite Pertinent Negatives: Patient denies any other problems mainly wants to see Dr. Joya Gaskins sooner. Disposition: []$ ED /[]$ Urgent Care (no appt availability in office) / [x]$ Appointment(In office/virtual)/ []$  [place] [facility]/ []$ Home Care/ []$ Refused Recommended Disposition /[]$ [place] [facility]/ []$  Follow-up with PCP Additional Notes: I have sent a message to [facility] letting them know she would like for her appt. With Dr. Joya Gaskins (who is the only provider she wants to see) to be moved up sooner than 01/15/2023.    She wants to discuss several issues with him.   She was agreeable to someone calling her back regarding a sooner appt.

## 2022-12-04 NOTE — Telephone Encounter (Signed)
Called patient and double booked for next week

## 2022-12-08 NOTE — Progress Notes (Unsigned)
Established Patient Office Visit  Subjective   Patient ID: Angel [name], female    DOB: 07-09-1956  Age: 67 y.o. MRN: [id number]  No chief complaint on file.   TOC OV d/c is < 7 days from OV , RN TOC OV has occured 7/13//23 Angel [name] is a 67 year old female who presents for follow up after a hospital admission. Patient with history of COPD, hyperlipidemia, hypertension, aortic arthrosclerosis, GERD and tobacco use. She was admitted from 7/05-7/07 for liver cirrhosis with ascites, patient had no prior knowledge of this condition.  Reports she has not drank alcohol in 2 weeks.   While admitted she had 3L of fluid taken off and was started on furesomide and aldactone daily. She was told to discontinue her daily statin and hypertensive medication (amlodipine). Labs also showed a low vitamin D level, and elevated iron. She was referred to [facility] for further workup and EGD. States she has made this appointment.   Currently she is managing her COPD with albuterol, advair and a duonebulizer. She feels like it is well controlled. She is still smoking cigarettes but reports she has cutdown recently as she has a roommate now and is not allowed to.   In addition, she reports a left lower leg/foot pain and rash that began prior to hospital admission. She was started on oral Bactrim following admission and is to continue til 7/21. Also states intermittent cramping of her bilateral lower extremities, worse at night. This has been ongoing for over 6 months per patient. She is unsure of any aggravating or alleviating factors.   Currently Angel [name] works as a Secretary/administrator but has been unable to due to the recent medical events.   Tests Needing Follow-up: -Check electrolytes with patient newly placed on Lasix and Aldactone -Assure patient continues to abstain from alcohol -Follow-up pending results from serologic cirrhosis work-up  Discharge Diagnoses: Newly diagnosed cirrhosis of the  liver with ascites Elevated iron Elevated F-Actin IgG Moderate Malnutritionrelated to liver cirrhosis Thrombocytopenia COPD HLD  Initial presentation: 67 year old with a history of COPD, alcohol abuse, cirrhosis of the liver, HTN, and HLD who presented to the ER with abdominal pain which has been worsening over a 3-week. And associated with distention in the abdomen.  Hospital Course:  Newly diagnosed cirrhosis of the liver with ascites No evidence of SBP on ascites fluid testing status post paracentesis removing 3 L- diuretics initiated - plan for outpatient EGD for variceal screening - full work-up to rule out other potential etiologies underwaywith some serologic studies pendingat time of d/c - will be addressed in f/u w/ GI service - acute viral hepatitis panel negative -patient has been counseled numerous times as to the absolute need to fully abstain from alcohol permanently  Elevated iron Iron level noted to be modestly elevated at 221 -work-up underway -for outpatient follow-up in GI office  Elevated F-Actin IgG ?autoimmune hepatitis - GI following  Moderate Malnutritionrelated to liver cirrhosis as evidenced byenergy intake < or equal to 75% for > or equal to 1 month & mild fat depletion  Thrombocytopenia Due to liver disease/splenic sequestration -platelet count stable with no evidence of spontaneous bleeding.  His hospital stay  Alcohol abuse Patient counseled at great length about the absolute need to avoid alcohol completely going forward and the resumption of alcohol abuse would lead to liver failure and death  COPD Well compensated at present -continue usual home medications  HLD Statin on hold due to elevated LFTs  9/11 Patient is seen in return follow-up for alcohol induced cirrhotic liver disease hypertension aortic atherosclerosis and COPD with bronchiectasis.  The patient has been smoking about a pack of cigarettes every 6 days but is no  longer drinking alcohol.  She did have a bout of COVID in August and I did prescribe an oral antiviral for her and she is improving from this.  The patient agrees to receive a tetanus vaccine through patient assistance and a flu vaccine and Prevnar 20 today.  She needs a mammogram.  The patient also will need colon cancer screening at some point.  On arrival blood pressure 129/78.  She has no other real complaints.  Note we have been holding statin therapy because of elevated liver functions and will continue to hold them for now.  Liver functions at her gastroenterologist have come down into the mid 70 range.  12/12 This patient seen in return follow-up for alcoholic cirrhosis.  Recently seen in of October in the emergency room for E. coli urinary tract infection was treated and cleared.  She has been alcohol free now for 5 months.  She does have occasional right upper quadrant pain and has decreased energy not sleeping.  She is followed by gastroenterology who treated her with a course of steroids and now azathioprine for alcohol induced hepatitis.  There is no evidence of hepatocellular carcinoma on her recent MRI.  She was initially interested in a COVID-vaccine but then declined later during the visit.  She is down to 5 cigarettes daily smoking and has occasional shortness of breath has established COPD.  There are no other complaints.  On arrival blood pressure 127/80.  12/11/22 Colon HTN (hypertension) - Primary       Hypertension well-controlled continue current medications refills given      Relevant Medications   furosemide (LASIX) 20 MG tablet   spironolactone (ALDACTONE) 100 MG tablet      Respiratory   COPD (chronic obstructive pulmonary disease) ([place])      Continue with inhaled medications no changes made        Patient Active Problem List   Diagnosis Date Noted  . Benign disease of right breast 11/06/2022  . Aortic atherosclerosis ([place]) 04/19/2022  . Vitamin D deficiency  04/19/2022  . Iron overload 04/19/2022  . Cirrhosis ([place]) 04/12/2022  . Malnutrition of moderate degree 04/12/2022  . HTN (hypertension) 04/11/2022  . HLD (hyperlipidemia) 04/11/2022  . Hypoalbuminemia 04/11/2022  . Alcoholic cirrhosis of liver with ascites ([place]) 04/11/2022  . Bronchiectasis without acute exacerbation ([place]) 03/06/2014  . COPD (chronic obstructive pulmonary disease) ([place]) 02/19/2014  . Environmental allergies 02/19/2014  . Smoker 12/31/2013   Past Medical History:  Diagnosis Date  . Alcohol use 04/19/2022  . Alcoholic cirrhosis ([place])   . Asthma   . Bronchiolitis   . COPD (chronic obstructive pulmonary disease) ([place])   . Hypertension    Past Surgical History:  Procedure Laterality Date  . ABDOMINAL HYSTERECTOMY    . APPENDECTOMY    . BACK SURGERY     Social History   Tobacco Use  . Smoking status: Former    Packs/day: 0.50    Years: 30.00    Total pack years: 15.00    Types: Cigarettes    Quit date: 12/30/2013    Years since quitting: 8.9  . Smokeless tobacco: Never  Substance Use Topics  . Alcohol use: Yes    Alcohol/week: 28.0 standard drinks of alcohol    Types: 28 Glasses of  wine per week  . Drug use: Yes    Frequency: 3.0 times per week    Types: Marijuana   Family History  Problem Relation Age of Onset  . Breast cancer Mother   . Colon cancer Mother   . CAD Father   . Heart disease Father   . Breast cancer Sister   . Emphysema Paternal Grandfather        never smoker, IT trainer   Allergies  Allergen Reactions  . Penicillins Hives      Review of Systems  Constitutional:  Positive for malaise/fatigue. Negative for chills, diaphoresis, fever and weight loss.  HENT:  Negative for congestion, hearing loss, nosebleeds, sore throat and tinnitus.   Eyes:  Negative for blurred vision, photophobia and redness.  Respiratory:  Positive for shortness of breath. Negative for cough, hemoptysis, sputum production, wheezing and stridor.    Cardiovascular:  Negative for chest pain, palpitations, orthopnea, claudication, leg swelling and PND.  Gastrointestinal:  Positive for abdominal pain. Negative for blood in stool, constipation, diarrhea, heartburn, melena, nausea and vomiting.  Genitourinary:  Negative for dysuria, flank pain, frequency, hematuria and urgency.  Musculoskeletal:  Negative for back pain, falls, joint pain, myalgias and neck pain.  Skin:  Negative for itching and rash.  Neurological:  Negative for dizziness, tingling, tremors, sensory change, speech change, focal weakness, seizures, loss of consciousness, weakness and headaches.  Endo/Heme/Allergies:  Negative for environmental allergies and polydipsia. Does not bruise/bleed easily.  Psychiatric/Behavioral:  Negative for depression, memory loss, substance abuse and suicidal ideas. The patient is not nervous/anxious and does not have insomnia.   All other systems reviewed and are negative.     Objective:     There were no vitals taken for this visit.   Physical Exam Vitals reviewed.  Constitutional:      Appearance: Normal appearance. She is well-developed. She is not diaphoretic.  HENT:     Head: Normocephalic and atraumatic.     Right Ear: Tympanic membrane, ear canal and external ear normal.     Left Ear: Tympanic membrane, ear canal and external ear normal.     Nose: Nose normal. No nasal deformity, septal deviation, mucosal edema or rhinorrhea.     Right Sinus: No maxillary sinus tenderness or frontal sinus tenderness.     Left Sinus: No maxillary sinus tenderness or frontal sinus tenderness.     Mouth/Throat:     Mouth: Mucous membranes are moist.     Pharynx: Oropharynx is clear. No oropharyngeal exudate.  Eyes:     General: No scleral icterus.    Conjunctiva/sclera: Conjunctivae normal.     Pupils: Pupils are equal, round, and reactive to light.  Neck:     Thyroid: No thyromegaly.     Vascular: No carotid bruit or JVD.     Trachea:  Trachea normal. No tracheal tenderness or tracheal deviation.  Cardiovascular:     Rate and Rhythm: Normal rate and regular rhythm.     Chest Wall: PMI is not displaced.     Pulses: Normal pulses. No decreased pulses.     Heart sounds: Normal heart sounds, S1 normal and S2 normal. Heart sounds not distant. No murmur heard.    No systolic murmur is present.     No diastolic murmur is present.     No friction rub. No gallop. No S3 or S4 sounds.  Pulmonary:     Effort: No tachypnea, accessory muscle usage or respiratory distress.     Breath sounds:  No stridor. No decreased breath sounds, wheezing, rhonchi or rales.  Chest:     Chest wall: No tenderness.  Abdominal:     General: Bowel sounds are normal. There is no distension.     Palpations: Abdomen is soft. Abdomen is not rigid.     Tenderness: There is no abdominal tenderness. There is no guarding or rebound.  Musculoskeletal:        General: Normal range of motion.     Cervical back: Normal range of motion and neck supple. No edema, erythema or rigidity. No muscular tenderness. Normal range of motion.  Lymphadenopathy:     Head:     Right side of head: No submental or submandibular adenopathy.     Left side of head: No submental or submandibular adenopathy.     Cervical: No cervical adenopathy.  Skin:    General: Skin is warm and dry.     Coloration: Skin is not pale.     Findings: No rash.     Nails: There is no clubbing.  Neurological:     General: No focal deficit present.     Mental Status: She is alert and oriented to person, place, and time.     Sensory: No sensory deficit.  Psychiatric:        Mood and Affect: Mood normal.        Speech: Speech normal.        Behavior: Behavior normal.     No results found for any visits on 12/11/22.    The ASCVD Risk score (Arnett DK, et al., 2019) failed to calculate for the following reasons:   The valid total cholesterol range is 130 to 320 mg/dL    Assessment & Plan:    Problem List Items Addressed This Visit   None Gastroenterology has going to follow-up with colonoscopy 38 minutes spent complex decision making is high No follow-ups on file.    Asencion Noble, MD

## 2022-12-11 ENCOUNTER — Encounter: Payer: Self-pay | Admitting: Critical Care Medicine

## 2022-12-11 ENCOUNTER — Ambulatory Visit: Payer: Medicaid Other | Attending: Critical Care Medicine | Admitting: Critical Care Medicine

## 2022-12-11 ENCOUNTER — Other Ambulatory Visit: Payer: Self-pay

## 2022-12-11 VITALS — BP 116/72 | HR 87 | Wt 147.6 lb

## 2022-12-11 DIAGNOSIS — J441 Chronic obstructive pulmonary disease with (acute) exacerbation: Secondary | ICD-10-CM

## 2022-12-11 DIAGNOSIS — K7031 Alcoholic cirrhosis of liver with ascites: Secondary | ICD-10-CM | POA: Diagnosis not present

## 2022-12-11 DIAGNOSIS — K089 Disorder of teeth and supporting structures, unspecified: Secondary | ICD-10-CM

## 2022-12-11 DIAGNOSIS — F17200 Nicotine dependence, unspecified, uncomplicated: Secondary | ICD-10-CM

## 2022-12-11 DIAGNOSIS — I700 Atherosclerosis of aorta: Secondary | ICD-10-CM

## 2022-12-11 DIAGNOSIS — E559 Vitamin D deficiency, unspecified: Secondary | ICD-10-CM

## 2022-12-11 DIAGNOSIS — E782 Mixed hyperlipidemia: Secondary | ICD-10-CM

## 2022-12-11 DIAGNOSIS — I10 Essential (primary) hypertension: Secondary | ICD-10-CM

## 2022-12-11 DIAGNOSIS — N6091 Unspecified benign mammary dysplasia of right breast: Secondary | ICD-10-CM

## 2022-12-11 DIAGNOSIS — E440 Moderate protein-calorie malnutrition: Secondary | ICD-10-CM

## 2022-12-11 DIAGNOSIS — G8929 Other chronic pain: Secondary | ICD-10-CM

## 2022-12-11 DIAGNOSIS — L989 Disorder of the skin and subcutaneous tissue, unspecified: Secondary | ICD-10-CM

## 2022-12-11 MED ORDER — SPIRONOLACTONE 100 MG PO TABS
100.0000 mg | ORAL_TABLET | Freq: Every day | ORAL | 3 refills | Status: DC
  Filled 2022-12-11 – 2023-01-02 (×2): qty 30, 30d supply, fill #0
  Filled 2023-02-08 – 2023-02-21 (×2): qty 30, 30d supply, fill #1
  Filled 2023-03-26: qty 30, 30d supply, fill #2

## 2022-12-11 MED ORDER — OMEPRAZOLE 20 MG PO CPDR
20.0000 mg | DELAYED_RELEASE_CAPSULE | Freq: Every day | ORAL | 2 refills | Status: DC
  Filled 2022-12-11 – 2023-01-02 (×2): qty 60, 60d supply, fill #0
  Filled 2023-03-07 – 2023-03-14 (×2): qty 60, 60d supply, fill #1

## 2022-12-11 MED ORDER — VITAMIN D (ERGOCALCIFEROL) 1.25 MG (50000 UNIT) PO CAPS
50000.0000 [IU] | ORAL_CAPSULE | ORAL | 5 refills | Status: DC
  Filled 2022-12-11 – 2023-01-02 (×2): qty 12, 84d supply, fill #0
  Filled 2023-03-26: qty 12, 84d supply, fill #1

## 2022-12-11 MED ORDER — VENTOLIN HFA 108 (90 BASE) MCG/ACT IN AERS
2.0000 | INHALATION_SPRAY | Freq: Four times a day (QID) | RESPIRATORY_TRACT | 1 refills | Status: DC | PRN
  Filled 2022-12-11: qty 54, 84d supply, fill #0
  Filled 2023-01-25: qty 18, 25d supply, fill #0
  Filled 2023-04-01: qty 18, 25d supply, fill #1
  Filled 2023-05-20: qty 18, 25d supply, fill #2
  Filled 2023-06-24: qty 18, 25d supply, fill #3
  Filled 2023-10-07: qty 18, 25d supply, fill #4
  Filled 2023-12-02: qty 18, 25d supply, fill #5

## 2022-12-11 NOTE — Assessment & Plan Note (Signed)
Reassess iron level

## 2022-12-11 NOTE — Patient Instructions (Signed)
Go to dentist give on list that takes medicaid  Refills on medications were sent   Labs today: iron, blood count, metabolic liver panel  Referral to skin doctor made  Return 4 months Dr Joya Gaskins

## 2022-12-11 NOTE — Assessment & Plan Note (Signed)
Continue vitamin D replacement.  

## 2022-12-11 NOTE — Assessment & Plan Note (Signed)
Follow-up imaging will occur in July

## 2022-12-11 NOTE — Assessment & Plan Note (Signed)
Continue with diuretics and lactulose she is abstinent from alcohol 13 months

## 2022-12-11 NOTE — Assessment & Plan Note (Signed)
No longer smoking

## 2022-12-11 NOTE — Assessment & Plan Note (Signed)
Hypertension well-controlled will continue with spironolactone 100 mg daily furosemide 20 mg daily no other antihypertensives

## 2022-12-11 NOTE — Assessment & Plan Note (Signed)
Continue with dietary change alone she cannot take statins because of liver disease

## 2022-12-11 NOTE — Assessment & Plan Note (Signed)
COPD stable at this time continue Breo inhaler

## 2022-12-12 ENCOUNTER — Telehealth: Payer: Self-pay

## 2022-12-12 LAB — IRON,TIBC AND FERRITIN PANEL
Ferritin: 145 ng/mL (ref 15–150)
Iron Saturation: 25 % (ref 15–55)
Iron: 103 ug/dL (ref 27–139)
Total Iron Binding Capacity: 419 ug/dL (ref 250–450)
UIBC: 316 ug/dL (ref 118–369)

## 2022-12-12 LAB — COMPREHENSIVE METABOLIC PANEL
ALT: 28 IU/L (ref 0–32)
AST: 35 IU/L (ref 0–40)
Albumin/Globulin Ratio: 1.1 — ABNORMAL LOW (ref 1.2–2.2)
Albumin: 3.7 g/dL — ABNORMAL LOW (ref 3.9–4.9)
Alkaline Phosphatase: 162 IU/L — ABNORMAL HIGH (ref 44–121)
BUN/Creatinine Ratio: 18 (ref 12–28)
BUN: 13 mg/dL (ref 8–27)
Bilirubin Total: 0.3 mg/dL (ref 0.0–1.2)
CO2: 26 mmol/L (ref 20–29)
Calcium: 9.4 mg/dL (ref 8.7–10.3)
Chloride: 102 mmol/L (ref 96–106)
Creatinine, Ser: 0.73 mg/dL (ref 0.57–1.00)
Globulin, Total: 3.4 g/dL (ref 1.5–4.5)
Glucose: 91 mg/dL (ref 70–99)
Potassium: 4.6 mmol/L (ref 3.5–5.2)
Sodium: 141 mmol/L (ref 134–144)
Total Protein: 7.1 g/dL (ref 6.0–8.5)
eGFR: 92 mL/min/{1.73_m2} (ref >59)

## 2022-12-12 LAB — CBC WITH DIFFERENTIAL/PLATELET
Basophils Absolute: 0.1 10*3/uL (ref 0.0–0.2)
Basos: 1 %
EOS (ABSOLUTE): 0.1 10*3/uL (ref 0.0–0.4)
Eos: 1 %
Hematocrit: 43.8 % (ref 34.0–46.6)
Hemoglobin: 15.4 g/dL (ref 11.1–15.9)
Immature Grans (Abs): 0 10*3/uL (ref 0.0–0.1)
Immature Granulocytes: 0 %
Lymphocytes Absolute: 2.5 10*3/uL (ref 0.7–3.1)
Lymphs: 28 %
MCH: 32.1 pg (ref 26.6–33.0)
MCHC: 35.2 g/dL (ref 31.5–35.7)
MCV: 91 fL (ref 79–97)
Monocytes Absolute: 1.2 10*3/uL — ABNORMAL HIGH (ref 0.1–0.9)
Monocytes: 13 %
Neutrophils Absolute: 5.2 10*3/uL (ref 1.4–7.0)
Neutrophils: 57 %
Platelets: 168 10*3/uL (ref 150–450)
RBC: 4.8 x10E6/uL (ref 3.77–5.28)
RDW: 13.2 % (ref 11.7–15.4)
WBC: 9.2 10*3/uL (ref 3.4–10.8)

## 2022-12-12 NOTE — Progress Notes (Signed)
Let pt know liver inproved, kidney normal, blood count normal, iron normal

## 2022-12-12 NOTE — Telephone Encounter (Signed)
-----   Message from Elsie Stain, MD sent at 12/12/2022  5:50 AM EST ----- Let pt know liver inproved, kidney normal, blood count normal, iron normal

## 2022-12-12 NOTE — Telephone Encounter (Signed)
Pt was called and vm was left, Information has been sent to nurse pool.   

## 2022-12-26 ENCOUNTER — Other Ambulatory Visit: Payer: Self-pay

## 2023-01-02 ENCOUNTER — Other Ambulatory Visit: Payer: Self-pay

## 2023-01-03 ENCOUNTER — Other Ambulatory Visit: Payer: Self-pay

## 2023-01-15 ENCOUNTER — Ambulatory Visit: Payer: Medicaid Other | Admitting: Critical Care Medicine

## 2023-01-25 ENCOUNTER — Other Ambulatory Visit: Payer: Self-pay

## 2023-01-25 ENCOUNTER — Other Ambulatory Visit: Payer: Self-pay | Admitting: Critical Care Medicine

## 2023-01-25 MED ORDER — FOLIC ACID 1 MG PO TABS
1.0000 mg | ORAL_TABLET | Freq: Every day | ORAL | 1 refills | Status: DC
  Filled 2023-01-25: qty 90, 90d supply, fill #0
  Filled 2023-04-24: qty 30, 30d supply, fill #1
  Filled 2023-05-27: qty 30, 30d supply, fill #2
  Filled 2023-06-24: qty 30, 30d supply, fill #3

## 2023-01-30 ENCOUNTER — Other Ambulatory Visit: Payer: Self-pay

## 2023-02-14 ENCOUNTER — Other Ambulatory Visit: Payer: Self-pay

## 2023-02-21 ENCOUNTER — Other Ambulatory Visit: Payer: Self-pay

## 2023-02-22 ENCOUNTER — Other Ambulatory Visit: Payer: Self-pay

## 2023-03-06 ENCOUNTER — Other Ambulatory Visit: Payer: Self-pay

## 2023-03-14 ENCOUNTER — Other Ambulatory Visit: Payer: Self-pay

## 2023-03-15 ENCOUNTER — Other Ambulatory Visit: Payer: Self-pay

## 2023-03-28 ENCOUNTER — Other Ambulatory Visit: Payer: Self-pay

## 2023-04-15 NOTE — Progress Notes (Unsigned)
Established Patient Office Visit  Subjective   Patient ID: Angel [name], female    DOB: Feb 20, 1956  Age: 67 y.o. MRN: [id number]  No chief complaint on file.   TOC OV d/c is < 7 days from OV , RN TOC OV has occured 7/13//23 Angel [name] is a 67 year old female who presents for follow up after a hospital admission. Patient with history of COPD, hyperlipidemia, hypertension, aortic arthrosclerosis, GERD and tobacco use. She was admitted from 7/05-7/07 for liver cirrhosis with ascites, patient had no prior knowledge of this condition.  Reports she has not drank alcohol in 2 weeks.   While admitted she had 3L of fluid taken off and was started on furesomide and aldactone daily. She was told to discontinue her daily statin and hypertensive medication (amlodipine). Labs also showed a low vitamin D level, and elevated iron. She was referred to [facility] for further workup and EGD. States she has made this appointment.   Currently she is managing her COPD with albuterol, advair and a duonebulizer. She feels like it is well controlled. She is still smoking cigarettes but reports she has cutdown recently as she has a roommate now and is not allowed to.   In addition, she reports a left lower leg/foot pain and rash that began prior to hospital admission. She was started on oral Bactrim following admission and is to continue til 7/21. Also states intermittent cramping of her bilateral lower extremities, worse at night. This has been ongoing for over 6 months per patient. She is unsure of any aggravating or alleviating factors.   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    Tests Needing Follow-up: -Check electrolytes with patient newly placed on Lasix and Aldactone -Assure patient continues to abstain from alcohol -Follow-up pending results from serologic cirrhosis work-up   Discharge Diagnoses: Newly diagnosed cirrhosis of the  liver with ascites Elevated iron Elevated F-Actin IgG Moderate Malnutrition related to liver cirrhosis Thrombocytopenia COPD HLD   Initial presentation: 67 year old with a history of COPD, alcohol abuse, cirrhosis of the liver, HTN, and HLD who presented to the ER with abdominal pain which has been worsening over a 3-week.  And associated with distention in the abdomen.   Hospital Course:   Newly diagnosed cirrhosis of the liver with ascites No evidence of SBP on ascites fluid testing status post paracentesis removing 3 L - diuretics initiated - plan for outpatient EGD for variceal screening - full work-up to rule out other potential etiologies underway with some serologic studies pending at time of d/c - will be addressed in f/u w/ GI service - acute viral hepatitis panel negative -patient has been counseled numerous times as to the absolute need to fully abstain from alcohol permanently   Elevated iron Iron level noted to be modestly elevated at 221 -work-up underway -for outpatient follow-up in GI office   Elevated F-Actin IgG ?autoimmune hepatitis - GI following    Moderate Malnutrition related to liver cirrhosis as evidenced by energy intake < or equal to 75% for > or equal to 1 month & mild fat depletion   Thrombocytopenia Due to liver disease/splenic sequestration -platelet count stable with no evidence of spontaneous bleeding.  His hospital stay   Alcohol abuse Patient counseled at great length about the absolute need to avoid alcohol completely going forward and the resumption of alcohol abuse would lead to liver failure and death  COPD Well compensated at present -continue usual home medications   HLD Statin on hold due to elevated LFTs   9/11 Patient is seen in return follow-up for alcohol induced cirrhotic liver disease hypertension aortic atherosclerosis and COPD with bronchiectasis.  The patient has been smoking about a pack of cigarettes every 6 days but is no  longer drinking alcohol.  She did have a bout of COVID in August and I did prescribe an oral antiviral for her and she is improving from this.  The patient agrees to receive a tetanus vaccine through patient assistance and a flu vaccine and Prevnar 20 today.  She needs a mammogram.  The patient also will need colon cancer screening at some point.  On arrival blood pressure 129/78.  She has no other real complaints.  Note we have been holding statin therapy because of elevated liver functions and will continue to hold them for now.  Liver functions at her gastroenterologist have come down into the mid 70 range.  12/12 This patient seen in return follow-up for alcoholic cirrhosis.  Recently seen in of October in the emergency room for E. coli urinary tract infection was treated and cleared.  She has been alcohol free now for 5 months.  She does have occasional right upper quadrant pain and has decreased energy not sleeping.  She is followed by gastroenterology who treated her with a course of steroids and now azathioprine for alcohol induced hepatitis.  There is no evidence of hepatocellular carcinoma on her recent MRI.  She was initially interested in a COVID-vaccine but then declined later during the visit.  She is down to 5 cigarettes daily smoking and has occasional shortness of breath has established COPD.  There are no other complaints.  On arrival blood pressure 127/80.  12/11/22 Patient seen in return follow-up she remains quite fatigued and tired and has right jaw pain.  She states her glands under her right neck that are swollen.  She would like a dermatology referral for rash on face and chest.  She would like her iron levels repeated.  She has been off alcohol for 13 months.  She is followed by gastroenterology for autoimmune hepatitis for which she was given initially Imuran but stopped this due to side effects now is on low-dose prednisone.  There was a question of iron overload but this did not  pan out on liver biopsy.  She is followed by [facility] gastroenterology.  She does have a breast density that needs to be reexamined in July of this year.  The patient has a planned follow-up colonoscopy with her gastroenterology is yet to be scheduled. Blood pressure on arrival is good 116/72.  04/16/23     Patient Active Problem List   Diagnosis Date Noted   Benign disease of right breast 11/06/2022   Aortic atherosclerosis (HCC) 04/19/2022   Vitamin D deficiency 04/19/2022   Iron overload 04/19/2022   Cirrhosis (HCC) 04/12/2022   Malnutrition of moderate degree 04/12/2022   HTN (hypertension) 04/11/2022   HLD (hyperlipidemia) 04/11/2022   Hypoalbuminemia 04/11/2022   Alcoholic cirrhosis of liver with ascites (HCC) 04/11/2022   Bronchiectasis without acute exacerbation (HCC) 03/06/2014   COPD (chronic obstructive pulmonary disease) (HCC) 02/19/2014   Environmental allergies 02/19/2014   Past Medical History:  Diagnosis Date   Alcohol use 04/19/2022   Alcoholic cirrhosis (HCC)    Asthma    Bronchiolitis    COPD (chronic obstructive pulmonary disease) (HCC)    Hypertension    Smoker  12/31/2013   Past Surgical History:  Procedure Laterality Date   ABDOMINAL HYSTERECTOMY     APPENDECTOMY     BACK SURGERY     Social History   Tobacco Use   Smoking status: Former    Packs/day: 0.50    Years: 30.00    Additional pack years: 0.00    Total pack years: 15.00    Types: Cigarettes    Quit date: 12/30/2013    Years since quitting: 9.2   Smokeless tobacco: Never  Substance Use Topics   Alcohol use: Yes    Alcohol/week: 28.0 standard drinks of alcohol    Types: 28 Glasses of wine per week   Drug use: Yes    Frequency: 3.0 times per week    Types: Marijuana   Family History  Problem Relation Age of Onset   Breast cancer Mother    Colon cancer Mother    CAD Father    Heart disease Father    Breast cancer Sister    Emphysema Paternal Grandfather        never smoker, Marine scientist   Allergies  Allergen Reactions   Penicillins Hives      Review of Systems  Constitutional:  Positive for malaise/fatigue. Negative for chills, diaphoresis, fever and weight loss.  HENT:  Negative for congestion, hearing loss, nosebleeds, sore throat and tinnitus.        Right upper jaw pain  Eyes:  Negative for blurred vision, photophobia and redness.  Respiratory:  Negative for cough, hemoptysis, sputum production, shortness of breath, wheezing and stridor.   Cardiovascular:  Negative for chest pain, palpitations, orthopnea, claudication, leg swelling and PND.  Gastrointestinal:  Negative for abdominal pain, blood in stool, constipation, diarrhea, heartburn, melena, nausea and vomiting.  Genitourinary:  Negative for dysuria, flank pain, frequency, hematuria and urgency.  Musculoskeletal:  Negative for back pain, falls, joint pain, myalgias and neck pain.  Skin:  Negative for itching and rash.  Neurological:  Negative for dizziness, tingling, tremors, sensory change, speech change, focal weakness, seizures, loss of consciousness, weakness and headaches.  Endo/Heme/Allergies:  Negative for environmental allergies and polydipsia. Does not bruise/bleed easily.  Psychiatric/Behavioral:  Negative for depression, memory loss, substance abuse and suicidal ideas. The patient is not nervous/anxious and does not have insomnia.   All other systems reviewed and are negative.     Objective:     There were no vitals taken for this visit.   Physical Exam Vitals reviewed.  Constitutional:      Appearance: Normal appearance. She is well-developed. She is not diaphoretic.  HENT:     Head: Normocephalic and atraumatic.     Right Ear: Tympanic membrane, ear canal and external ear normal.     Left Ear: Tympanic membrane, ear canal and external ear normal.     Nose: Nose normal. No nasal deformity, septal deviation, mucosal edema or rhinorrhea.     Right Sinus: No maxillary sinus  tenderness or frontal sinus tenderness.     Left Sinus: No maxillary sinus tenderness or frontal sinus tenderness.     Mouth/Throat:     Mouth: Mucous membranes are moist.     Pharynx: Oropharynx is clear. No oropharyngeal exudate.     Comments: Poor dentition with dental caries posterior upper molars Eyes:     General: No scleral icterus.    Conjunctiva/sclera: Conjunctivae normal.     Pupils: Pupils are equal, round, and reactive to light.  Neck:     Thyroid: No thyromegaly.  Vascular: No carotid bruit or JVD.     Trachea: Trachea normal. No tracheal tenderness or tracheal deviation.  Cardiovascular:     Rate and Rhythm: Normal rate and regular rhythm.     Chest Wall: PMI is not displaced.     Pulses: Normal pulses. No decreased pulses.     Heart sounds: Normal heart sounds, S1 normal and S2 normal. Heart sounds not distant. No murmur heard.    No systolic murmur is present.     No diastolic murmur is present.     No friction rub. No gallop. No S3 or S4 sounds.  Pulmonary:     Effort: No tachypnea, accessory muscle usage or respiratory distress.     Breath sounds: No stridor. No decreased breath sounds, wheezing, rhonchi or rales.  Chest:     Chest wall: No tenderness.  Abdominal:     General: Bowel sounds are normal. There is no distension.     Palpations: Abdomen is soft. Abdomen is not rigid.     Tenderness: There is no abdominal tenderness. There is no guarding or rebound.  Musculoskeletal:        General: Normal range of motion.     Cervical back: Normal range of motion and neck supple. No edema, erythema or rigidity. No muscular tenderness. Normal range of motion.  Lymphadenopathy:     Head:     Right side of head: No submental or submandibular adenopathy.     Left side of head: No submental or submandibular adenopathy.     Cervical: No cervical adenopathy.  Skin:    General: Skin is warm and dry.     Coloration: Skin is not pale.     Findings: No rash.      Nails: There is no clubbing.  Neurological:     General: No focal deficit present.     Mental Status: She is alert and oriented to person, place, and time.     Sensory: No sensory deficit.  Psychiatric:        Mood and Affect: Mood normal.        Speech: Speech normal.        Behavior: Behavior normal.      No results found for any visits on 04/16/23.    The ASCVD Risk score (Arnett DK, et al., 2019) failed to calculate for the following reasons:   The valid total cholesterol range is 130 to 320 mg/dL    Assessment & Plan:   Problem List Items Addressed This Visit   None No follow-ups on file.    Shan Levans, MD

## 2023-04-16 ENCOUNTER — Other Ambulatory Visit: Payer: Self-pay | Admitting: Pharmacist

## 2023-04-16 ENCOUNTER — Ambulatory Visit: Payer: Medicaid Other | Attending: Critical Care Medicine | Admitting: Critical Care Medicine

## 2023-04-16 ENCOUNTER — Encounter: Payer: Self-pay | Admitting: Critical Care Medicine

## 2023-04-16 ENCOUNTER — Other Ambulatory Visit: Payer: Self-pay

## 2023-04-16 VITALS — BP 129/80 | HR 81 | Wt 155.2 lb

## 2023-04-16 DIAGNOSIS — Z1211 Encounter for screening for malignant neoplasm of colon: Secondary | ICD-10-CM

## 2023-04-16 DIAGNOSIS — K7031 Alcoholic cirrhosis of liver with ascites: Secondary | ICD-10-CM | POA: Diagnosis not present

## 2023-04-16 DIAGNOSIS — E871 Hypo-osmolality and hyponatremia: Secondary | ICD-10-CM | POA: Diagnosis not present

## 2023-04-16 DIAGNOSIS — E8809 Other disorders of plasma-protein metabolism, not elsewhere classified: Secondary | ICD-10-CM

## 2023-04-16 DIAGNOSIS — E782 Mixed hyperlipidemia: Secondary | ICD-10-CM

## 2023-04-16 DIAGNOSIS — I10 Essential (primary) hypertension: Secondary | ICD-10-CM

## 2023-04-16 MED ORDER — LACTULOSE 10 GM/15ML PO SOLN
10.0000 g | Freq: Three times a day (TID) | ORAL | 2 refills | Status: DC
  Filled 2023-04-16 – 2023-04-24 (×2): qty 1419, 32d supply, fill #0
  Filled 2023-06-24: qty 1419, 32d supply, fill #1
  Filled 2023-07-24: qty 1419, 32d supply, fill #2

## 2023-04-16 MED ORDER — FLUTICASONE FUROATE-VILANTEROL 100-25 MCG/ACT IN AEPB
1.0000 | INHALATION_SPRAY | Freq: Every day | RESPIRATORY_TRACT | 11 refills | Status: DC
  Filled 2023-04-16: qty 60, 30d supply, fill #0

## 2023-04-16 MED ORDER — METHOCARBAMOL 500 MG PO TABS
500.0000 mg | ORAL_TABLET | Freq: Four times a day (QID) | ORAL | 1 refills | Status: DC | PRN
  Filled 2023-04-16 – 2023-04-24 (×2): qty 60, 15d supply, fill #0
  Filled 2023-06-24: qty 60, 15d supply, fill #1

## 2023-04-16 MED ORDER — BUDESONIDE-FORMOTEROL FUMARATE 80-4.5 MCG/ACT IN AERO
2.0000 | INHALATION_SPRAY | Freq: Two times a day (BID) | RESPIRATORY_TRACT | 3 refills | Status: DC
  Filled 2023-04-16: qty 10.2, 30d supply, fill #0
  Filled 2023-10-08: qty 10.2, 25d supply, fill #0
  Filled 2023-11-24: qty 10.2, 34d supply, fill #1

## 2023-04-16 MED ORDER — OMEPRAZOLE 20 MG PO CPDR
20.0000 mg | DELAYED_RELEASE_CAPSULE | Freq: Every day | ORAL | 2 refills | Status: DC
  Filled 2023-04-16 – 2023-05-20 (×2): qty 60, 60d supply, fill #0
  Filled 2023-07-24: qty 60, 60d supply, fill #1
  Filled 2023-09-25: qty 60, 60d supply, fill #2

## 2023-04-16 MED ORDER — SPIRONOLACTONE 100 MG PO TABS
100.0000 mg | ORAL_TABLET | Freq: Every day | ORAL | 3 refills | Status: DC
  Filled 2023-04-16 – 2023-04-24 (×2): qty 30, 30d supply, fill #0
  Filled 2023-05-27: qty 30, 30d supply, fill #1
  Filled 2023-06-24: qty 30, 30d supply, fill #2
  Filled 2023-07-24: qty 30, 30d supply, fill #3

## 2023-04-16 MED ORDER — VITAMIN D (ERGOCALCIFEROL) 1.25 MG (50000 UNIT) PO CAPS
50000.0000 [IU] | ORAL_CAPSULE | ORAL | 5 refills | Status: DC
  Filled 2023-04-16 – 2023-06-24 (×2): qty 12, 84d supply, fill #0
  Filled 2023-09-25: qty 12, 84d supply, fill #1
  Filled 2023-11-24 – 2023-12-09 (×2): qty 12, 84d supply, fill #2
  Filled 2024-02-24: qty 12, 84d supply, fill #3

## 2023-04-16 MED ORDER — FUROSEMIDE 20 MG PO TABS
40.0000 mg | ORAL_TABLET | Freq: Every day | ORAL | 4 refills | Status: DC
  Filled 2023-04-16: qty 30, 23d supply, fill #0
  Filled 2023-05-20: qty 30, 23d supply, fill #1
  Filled 2023-06-24: qty 30, 23d supply, fill #2
  Filled 2023-07-24: qty 30, 23d supply, fill #3

## 2023-04-16 MED ORDER — PREDNISONE 5 MG PO TABS
5.0000 mg | ORAL_TABLET | Freq: Every day | ORAL | 1 refills | Status: DC
  Filled 2023-04-16: qty 90, 90d supply, fill #0
  Filled 2023-04-24: qty 30, 30d supply, fill #0
  Filled 2023-05-27: qty 30, 30d supply, fill #1
  Filled 2023-06-24: qty 30, 30d supply, fill #2
  Filled 2023-07-24: qty 30, 30d supply, fill #3
  Filled 2023-08-28: qty 30, 30d supply, fill #4

## 2023-04-16 NOTE — Assessment & Plan Note (Signed)
Concern patient is developing more ascites will assess with ultrasound of abdomen increase furosemide to 40 mg daily maintain Aldactone 100 mg daily and see patient back short-term follow-up also check labs

## 2023-04-16 NOTE — Patient Instructions (Signed)
Medications refilled Ultrasound of liver will be obtained , they will call to schedule Labs today Increase furosemide to two daily for one week then one a day Iron levels will be obtained Referral to new Gastro MD at [facility] will be made for colonscopy Return Dr Delford Field 6 weeks

## 2023-04-16 NOTE — Assessment & Plan Note (Signed)
Hypertension currently at goal patient is taking Aldactone for her liver disease will maintain this for now no other medicines needed

## 2023-04-16 NOTE — Assessment & Plan Note (Signed)
Reassess iron levels 

## 2023-04-16 NOTE — Assessment & Plan Note (Signed)
Check metabolic profile 

## 2023-04-16 NOTE — Assessment & Plan Note (Addendum)
Monitor cannot give statins because of liver disease

## 2023-04-17 ENCOUNTER — Telehealth: Payer: Self-pay

## 2023-04-17 LAB — CBC WITH DIFFERENTIAL/PLATELET
Basophils Absolute: 0.1 10*3/uL (ref 0.0–0.2)
Basos: 1 %
EOS (ABSOLUTE): 0.1 10*3/uL (ref 0.0–0.4)
Eos: 1 %
Hematocrit: 48.8 % — ABNORMAL HIGH (ref 34.0–46.6)
Hemoglobin: 16.3 g/dL — ABNORMAL HIGH (ref 11.1–15.9)
Immature Grans (Abs): 0.1 10*3/uL (ref 0.0–0.1)
Immature Granulocytes: 1 %
Lymphocytes Absolute: 2.6 10*3/uL (ref 0.7–3.1)
Lymphs: 28 %
MCH: 30.5 pg (ref 26.6–33.0)
MCHC: 33.4 g/dL (ref 31.5–35.7)
MCV: 91 fL (ref 79–97)
Monocytes Absolute: 1.1 10*3/uL — ABNORMAL HIGH (ref 0.1–0.9)
Monocytes: 12 %
Neutrophils Absolute: 5.3 10*3/uL (ref 1.4–7.0)
Neutrophils: 57 %
Platelets: 200 10*3/uL (ref 150–450)
RBC: 5.34 x10E6/uL — ABNORMAL HIGH (ref 3.77–5.28)
RDW: 12.9 % (ref 11.7–15.4)
WBC: 9.3 10*3/uL (ref 3.4–10.8)

## 2023-04-17 LAB — IRON,TIBC AND FERRITIN PANEL
Ferritin: 144 ng/mL (ref 15–150)
Iron Saturation: 23 % (ref 15–55)
Iron: 107 ug/dL (ref 27–139)
Total Iron Binding Capacity: 471 ug/dL — ABNORMAL HIGH (ref 250–450)
UIBC: 364 ug/dL (ref 118–369)

## 2023-04-17 LAB — COMPREHENSIVE METABOLIC PANEL
ALT: 26 IU/L (ref 0–32)
AST: 30 IU/L (ref 0–40)
Albumin: 4.3 g/dL (ref 3.9–4.9)
Alkaline Phosphatase: 156 IU/L — ABNORMAL HIGH (ref 44–121)
BUN/Creatinine Ratio: 24 (ref 12–28)
BUN: 23 mg/dL (ref 8–27)
Bilirubin Total: 0.3 mg/dL (ref 0.0–1.2)
CO2: 23 mmol/L (ref 20–29)
Calcium: 9.6 mg/dL (ref 8.7–10.3)
Chloride: 100 mmol/L (ref 96–106)
Creatinine, Ser: 0.94 mg/dL (ref 0.57–1.00)
Globulin, Total: 3.4 g/dL (ref 1.5–4.5)
Glucose: 74 mg/dL (ref 70–99)
Potassium: 4.5 mmol/L (ref 3.5–5.2)
Sodium: 138 mmol/L (ref 134–144)
Total Protein: 7.7 g/dL (ref 6.0–8.5)
eGFR: 68 mL/min/{1.73_m2} (ref >59)

## 2023-04-17 NOTE — Telephone Encounter (Signed)
Pt was called and vm was left, Information has been sent to nurse pool.   

## 2023-04-17 NOTE — Telephone Encounter (Signed)
-----   Message from Storm Frisk, MD sent at 04/17/2023  5:51 AM EDT ----- Let pt know liver is improved, blood counts and iron levels normal

## 2023-04-17 NOTE — Progress Notes (Signed)
Let pt know liver is improved, blood counts and iron levels normal

## 2023-04-22 ENCOUNTER — Other Ambulatory Visit: Payer: Self-pay

## 2023-04-23 ENCOUNTER — Other Ambulatory Visit: Payer: Self-pay

## 2023-04-23 ENCOUNTER — Ambulatory Visit
Admission: RE | Admit: 2023-04-23 | Discharge: 2023-04-23 | Disposition: A | Discharge status: Home / Self Care | Payer: Medicaid Other | Source: Ambulatory Visit | Attending: Critical Care Medicine | Admitting: Critical Care Medicine

## 2023-04-23 DIAGNOSIS — K7031 Alcoholic cirrhosis of liver with ascites: Secondary | ICD-10-CM

## 2023-04-24 ENCOUNTER — Other Ambulatory Visit: Payer: Self-pay | Admitting: Critical Care Medicine

## 2023-04-24 ENCOUNTER — Other Ambulatory Visit: Payer: Self-pay

## 2023-04-24 MED ORDER — HYDROXYZINE HCL 25 MG PO TABS
25.0000 mg | ORAL_TABLET | Freq: Every day | ORAL | 0 refills | Status: DC | PRN
  Filled 2023-04-24: qty 30, 30d supply, fill #0

## 2023-04-24 MED ORDER — THIAMINE HCL 100 MG PO TABS
100.0000 mg | ORAL_TABLET | Freq: Every day | ORAL | 0 refills | Status: DC
  Filled 2023-04-24 – 2023-11-22 (×2): qty 100, 100d supply, fill #0

## 2023-04-25 ENCOUNTER — Other Ambulatory Visit: Payer: Self-pay

## 2023-04-29 NOTE — Progress Notes (Signed)
Let pt know  ultrasound shows no fluid in abdomen. Cirrhosis present and  stable

## 2023-04-30 ENCOUNTER — Telehealth: Payer: Self-pay | Admitting: Critical Care Medicine

## 2023-04-30 ENCOUNTER — Telehealth: Payer: Self-pay | Admitting: *Deleted

## 2023-04-30 NOTE — Telephone Encounter (Signed)
This will be determined by the gastroenterology doctor either way would be fine with me

## 2023-04-30 NOTE — Telephone Encounter (Signed)
Copied from CRM [id number]. Topic: General - Other >> Apr 30, 2023  4:04 PM Franchot Heidelberg wrote: Reason for CRM: Pt says her PCP told her to have her colonoscopy at the hospital. She is currently scheduled with [facility] for this Friday and wants to know if PCP approves... please advise

## 2023-04-30 NOTE — Telephone Encounter (Signed)
Pt given lab results per notes of Dr. Delford Field from 04/29/23 on 04/30/23. Pt verbalized understanding  ultrasound shows no fluid in abdomen. Cirrhosis present and stable. Patient reports she is going to be scheduled for consult for colonoscopy and due to her current situation, would like PCP to respond if she needs colonoscopy done in hospital vs in GI office.  Please advise.

## 2023-05-01 NOTE — Telephone Encounter (Signed)
See other message regarding this  

## 2023-05-02 ENCOUNTER — Telehealth: Payer: Self-pay

## 2023-05-02 NOTE — Telephone Encounter (Signed)
-----   Message from Shan Levans sent at 04/29/2023  9:42 AM EDT ----- Let pt know  ultrasound shows no fluid in abdomen. Cirrhosis present and  stable

## 2023-05-02 NOTE — Telephone Encounter (Signed)
Called patient and left voicemail.

## 2023-05-02 NOTE — Telephone Encounter (Signed)
Called patient following up on results and  has seen them in [place]

## 2023-05-03 ENCOUNTER — Ambulatory Visit: Payer: Medicaid Other | Admitting: Internal Medicine

## 2023-05-07 ENCOUNTER — Ambulatory Visit
Admission: RE | Admit: 2023-05-07 | Discharge: 2023-05-07 | Disposition: A | Discharge status: Home / Self Care | Payer: No Typology Code available for payment source | Source: Ambulatory Visit | Attending: Critical Care Medicine | Admitting: Critical Care Medicine

## 2023-05-07 ENCOUNTER — Other Ambulatory Visit: Payer: Self-pay | Admitting: Critical Care Medicine

## 2023-05-07 DIAGNOSIS — R92321 Mammographic fibroglandular density, right breast: Secondary | ICD-10-CM

## 2023-05-07 DIAGNOSIS — N6489 Other specified disorders of breast: Secondary | ICD-10-CM

## 2023-05-08 NOTE — Progress Notes (Signed)
Let pt know mammogram normal no cancer recheck one year

## 2023-05-10 ENCOUNTER — Telehealth: Payer: Self-pay

## 2023-05-10 NOTE — Telephone Encounter (Signed)
Pt was called and vm was left, Information has been sent to nurse pool.   

## 2023-05-10 NOTE — Telephone Encounter (Signed)
-----   Message from Shan Levans sent at 05/08/2023  5:25 AM EDT ----- Let pt know mammogram normal no cancer recheck one year

## 2023-05-20 ENCOUNTER — Other Ambulatory Visit: Payer: Self-pay

## 2023-05-27 ENCOUNTER — Other Ambulatory Visit: Payer: Self-pay

## 2023-05-29 ENCOUNTER — Other Ambulatory Visit: Payer: Self-pay

## 2023-06-04 ENCOUNTER — Encounter: Payer: Self-pay | Admitting: Critical Care Medicine

## 2023-06-04 ENCOUNTER — Ambulatory Visit: Payer: Medicaid Other | Attending: Critical Care Medicine | Admitting: Critical Care Medicine

## 2023-06-04 VITALS — BP 122/77 | HR 83 | Wt 159.2 lb

## 2023-06-04 DIAGNOSIS — R252 Cramp and spasm: Secondary | ICD-10-CM

## 2023-06-04 DIAGNOSIS — I10 Essential (primary) hypertension: Secondary | ICD-10-CM | POA: Diagnosis not present

## 2023-06-04 DIAGNOSIS — E782 Mixed hyperlipidemia: Secondary | ICD-10-CM

## 2023-06-04 DIAGNOSIS — K7031 Alcoholic cirrhosis of liver with ascites: Secondary | ICD-10-CM

## 2023-06-04 DIAGNOSIS — J432 Centrilobular emphysema: Secondary | ICD-10-CM

## 2023-06-04 DIAGNOSIS — Z23 Encounter for immunization: Secondary | ICD-10-CM

## 2023-06-04 NOTE — Assessment & Plan Note (Signed)
As per gastroenterology

## 2023-06-04 NOTE — Assessment & Plan Note (Signed)
Cannot give statins

## 2023-06-04 NOTE — Patient Instructions (Signed)
Return shingles vaccine series Labs today You have refills no medication change Keep [facility] apps Return new pcp 4 months

## 2023-06-04 NOTE — Assessment & Plan Note (Signed)
Hypertension currently at goal no change in medications in the sense that we are monitoring her on spironolactone only 100 mg daily also being used for her liver disease

## 2023-06-04 NOTE — Progress Notes (Signed)
Established Patient Office Visit  Subjective   Patient ID: SAPPHIRE BING, female    DOB: 02/17/1956  Age: 67 y.o. MRN: [id number]  Chief Complaint  Patient presents with   Arthritis   Spasms     TOC OV d/c is < 7 days from OV , RN TOC OV has occured 7/13//23 Ms Kiecker is a 67 year old female who presents for follow up after a hospital admission. Patient with history of COPD, hyperlipidemia, hypertension, aortic arthrosclerosis, GERD and tobacco use. She was admitted from 7/05-7/07 for liver cirrhosis with ascites, patient had no prior knowledge of this condition.  Reports she has not drank alcohol in 2 weeks.   While admitted she had 3L of fluid taken off and was started on furesomide and aldactone daily. She was told to discontinue her daily statin and hypertensive medication (amlodipine). Labs also showed a low vitamin D level, and elevated iron. She was referred to [facility] for further workup and EGD. States she has made this appointment.   Currently she is managing her COPD with albuterol, advair and a duonebulizer. She feels like it is well controlled. She is still smoking cigarettes but reports she has cutdown recently as she has a roommate now and is not allowed to.   In addition, she reports a left lower leg/foot pain and rash that began prior to hospital admission. She was started on oral Bactrim following admission and is to continue til 7/21. Also states intermittent cramping of her bilateral lower extremities, worse at night. This has been ongoing for over 6 months per patient. She is unsure of any aggravating or alleviating factors.   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    Tests Needing Follow-up: -Check electrolytes with patient newly placed on Lasix and Aldactone -Assure patient continues to abstain from alcohol -Follow-up pending results from serologic cirrhosis work-up   Discharge  Diagnoses: Newly diagnosed cirrhosis of the liver with ascites Elevated iron Elevated F-Actin IgG Moderate Malnutrition related to liver cirrhosis Thrombocytopenia COPD HLD   Initial presentation: 67 year old with a history of COPD, alcohol abuse, cirrhosis of the liver, HTN, and HLD who presented to the ER with abdominal pain which has been worsening over a 3-week.  And associated with distention in the abdomen.   Hospital Course:   Newly diagnosed cirrhosis of the liver with ascites No evidence of SBP on ascites fluid testing status post paracentesis removing 3 L - diuretics initiated - plan for outpatient EGD for variceal screening - full work-up to rule out other potential etiologies underway with some serologic studies pending at time of d/c - will be addressed in f/u w/ GI service - acute viral hepatitis panel negative -patient has been counseled numerous times as to the absolute need to fully abstain from alcohol permanently   Elevated iron Iron level noted to be modestly elevated at 221 -work-up underway -for outpatient follow-up in GI office   Elevated F-Actin IgG ?autoimmune hepatitis - GI following    Moderate Malnutrition related to liver cirrhosis as evidenced by energy intake < or equal to 75% for > or equal to 1 month & mild fat depletion   Thrombocytopenia Due to liver disease/splenic sequestration -platelet count stable with no evidence of spontaneous bleeding.  His hospital stay   Alcohol abuse Patient counseled at great length about the absolute need to avoid alcohol completely going forward and the resumption of alcohol abuse  would lead to liver failure and death   COPD Well compensated at present -continue usual home medications   HLD Statin on hold due to elevated LFTs   9/11 Patient is seen in return follow-up for alcohol induced cirrhotic liver disease hypertension aortic atherosclerosis and COPD with bronchiectasis.  The patient has been smoking about  a pack of cigarettes every 6 days but is no longer drinking alcohol.  She did have a bout of COVID in August and I did prescribe an oral antiviral for her and she is improving from this.  The patient agrees to receive a tetanus vaccine through patient assistance and a flu vaccine and Prevnar 20 today.  She needs a mammogram.  The patient also will need colon cancer screening at some point.  On arrival blood pressure 129/78.  She has no other real complaints.  Note we have been holding statin therapy because of elevated liver functions and will continue to hold them for now.  Liver functions at her gastroenterologist have come down into the mid 70 range.  12/12 This patient seen in return follow-up for alcoholic cirrhosis.  Recently seen in of October in the emergency room for E. coli urinary tract infection was treated and cleared.  She has been alcohol free now for 5 months.  She does have occasional right upper quadrant pain and has decreased energy not sleeping.  She is followed by gastroenterology who treated her with a course of steroids and now azathioprine for alcohol induced hepatitis.  There is no evidence of hepatocellular carcinoma on her recent MRI.  She was initially interested in a COVID-vaccine but then declined later during the visit.  She is down to 5 cigarettes daily smoking and has occasional shortness of breath has established COPD.  There are no other complaints.  On arrival blood pressure 127/80.  12/11/22 Patient seen in return follow-up she remains quite fatigued and tired and has right jaw pain.  She states her glands under her right neck that are swollen.  She would like a dermatology referral for rash on face and chest.  She would like her iron levels repeated.  She has been off alcohol for 13 months.  She is followed by gastroenterology for autoimmune hepatitis for which she was given initially Imuran but stopped this due to side effects now is on low-dose prednisone.  There was a  question of iron overload but this did not pan out on liver biopsy.  She is followed by [facility] gastroenterology.  She does have a breast density that needs to be reexamined in July of this year.  The patient has a planned follow-up colonoscopy with her gastroenterology is yet to be scheduled. Blood pressure on arrival is good 116/72.  04/16/23 Patient seen in return follow-up she has been feeling more fatigued and having more abdominal swelling and weight gain of 10 pounds over the past several months.  She has been off alcohol for 1 year and was congratulated upon this.  She has autoimmune hepatitis has been on low-dose prednisone.  She is followed with gastroenterology at [facility].  She  needs a colonoscopy unfortunately the gastroenterologist will not accept her Medicaid she would like a second opinion for the colonoscopy  06/04/23 Patient seen in return for follow-up she does have some chronic wheezing and right lower extremity cramping for the past 2 weeks.  Last ultrasound of abdomen did not show ascites.  She is being reestablished with a new liver specialist at [facility].  She will  be getting upper and lower evaluations of her stomach and colon.  She will also have assessments of her liver at that visit.  Patient is using her inhalers.  She needs medication refills.  She is no longer drinking alcohol.    Patient Active Problem List   Diagnosis Date Noted   Benign disease of right breast 11/06/2022   Aortic atherosclerosis (HCC) 04/19/2022   Vitamin D deficiency 04/19/2022   Iron overload 04/19/2022   Cirrhosis (HCC) 04/12/2022   Malnutrition of moderate degree 04/12/2022   HTN (hypertension) 04/11/2022   HLD (hyperlipidemia) 04/11/2022   Hypoalbuminemia 04/11/2022   Alcoholic cirrhosis of liver with ascites (HCC) 04/11/2022   Bronchiectasis without acute exacerbation (HCC) 03/06/2014   COPD (chronic obstructive pulmonary disease) (HCC) 02/19/2014   Environmental allergies 02/19/2014   Past  Medical History:  Diagnosis Date   Alcohol use 04/19/2022   Alcoholic cirrhosis (HCC)    Asthma    Bronchiolitis    COPD (chronic obstructive pulmonary disease) (HCC)    Hypertension    Smoker 12/31/2013   Past Surgical History:  Procedure Laterality Date   ABDOMINAL HYSTERECTOMY     APPENDECTOMY     BACK SURGERY     Social History   Tobacco Use   Smoking status: Former    Current packs/day: 0.00    Average packs/day: 0.5 packs/day for 30.0 years (15.0 ttl pk-yrs)    Types: Cigarettes    Start date: 12/31/1983    Quit date: 12/30/2013    Years since quitting: 9.4   Smokeless tobacco: Never  Substance Use Topics   Alcohol use: Yes    Alcohol/week: 28.0 standard drinks of alcohol    Types: 28 Glasses of wine per week   Drug use: Yes    Frequency: 3.0 times per week    Types: Marijuana   Family History  Problem Relation Age of Onset   Breast cancer Mother    Colon cancer Mother    CAD Father    Heart disease Father    Breast cancer Sister    Emphysema Paternal Grandfather        never smoker, Theatre stage manager   Allergies  Allergen Reactions   Penicillins Hives      Review of Systems  Constitutional:  Negative for chills, diaphoresis, fever, malaise/fatigue and weight loss.  HENT:  Negative for congestion, hearing loss, nosebleeds, sore throat and tinnitus.        Right upper jaw pain  Eyes:  Negative for blurred vision, photophobia and redness.  Respiratory:  Negative for cough, hemoptysis, sputum production, shortness of breath, wheezing and stridor.   Cardiovascular:  Negative for chest pain, palpitations, orthopnea, claudication, leg swelling and PND.  Gastrointestinal:  Negative for abdominal pain, blood in stool, constipation, diarrhea, heartburn, melena, nausea and vomiting.  Genitourinary:  Negative for dysuria, flank pain, frequency, hematuria and urgency.  Musculoskeletal:  Negative for back pain, falls, joint pain, myalgias and neck pain.  Skin:  Negative  for itching and rash.  Neurological:  Negative for dizziness, tingling, tremors, sensory change, speech change, focal weakness, seizures, loss of consciousness, weakness and headaches.  Endo/Heme/Allergies:  Negative for environmental allergies and polydipsia. Does not bruise/bleed easily.  Psychiatric/Behavioral:  Negative for depression, memory loss, substance abuse and suicidal ideas. The patient is not nervous/anxious and does not have insomnia.   All other systems reviewed and are negative.     Objective:     BP 122/77 (BP Location: Left Arm, Patient Position: Sitting, Cuff  Size: Normal)   Pulse 83   Wt 159 lb 3.2 oz (72.2 kg)   SpO2 97%   BMI 24.93 kg/m    Physical Exam Vitals reviewed.  Constitutional:      Appearance: Normal appearance. She is well-developed. She is not diaphoretic.  HENT:     Head: Normocephalic and atraumatic.     Right Ear: Tympanic membrane, ear canal and external ear normal.     Left Ear: Tympanic membrane, ear canal and external ear normal.     Nose: Nose normal. No nasal deformity, septal deviation, mucosal edema or rhinorrhea.     Right Sinus: No maxillary sinus tenderness or frontal sinus tenderness.     Left Sinus: No maxillary sinus tenderness or frontal sinus tenderness.     Mouth/Throat:     Mouth: Mucous membranes are moist.     Pharynx: Oropharynx is clear. No oropharyngeal exudate.     Comments: Poor dentition with dental caries posterior upper molars Eyes:     General: No scleral icterus.    Conjunctiva/sclera: Conjunctivae normal.     Pupils: Pupils are equal, round, and reactive to light.  Neck:     Thyroid: No thyromegaly.     Vascular: No carotid bruit or JVD.     Trachea: Trachea normal. No tracheal tenderness or tracheal deviation.  Cardiovascular:     Rate and Rhythm: Normal rate and regular rhythm.     Chest Wall: PMI is not displaced.     Pulses: Normal pulses. No decreased pulses.     Heart sounds: Normal heart sounds,  S1 normal and S2 normal. Heart sounds not distant. No murmur heard.    No systolic murmur is present.     No diastolic murmur is present.     No friction rub. No gallop. No S3 or S4 sounds.  Pulmonary:     Effort: No tachypnea, accessory muscle usage or respiratory distress.     Breath sounds: No stridor. No decreased breath sounds, wheezing, rhonchi or rales.  Chest:     Chest wall: No tenderness.  Abdominal:     General: Bowel sounds are normal. There is no distension.     Palpations: Abdomen is soft. Abdomen is not rigid.     Tenderness: There is no abdominal tenderness. There is no guarding or rebound.  Musculoskeletal:        General: Normal range of motion.     Cervical back: Normal range of motion and neck supple. No edema, erythema or rigidity. No muscular tenderness. Normal range of motion.  Lymphadenopathy:     Head:     Right side of head: No submental or submandibular adenopathy.     Left side of head: No submental or submandibular adenopathy.     Cervical: No cervical adenopathy.  Skin:    General: Skin is warm and dry.     Coloration: Skin is not pale.     Findings: No rash.     Nails: There is no clubbing.  Neurological:     General: No focal deficit present.     Mental Status: She is alert and oriented to person, place, and time.     Sensory: No sensory deficit.  Psychiatric:        Mood and Affect: Mood normal.        Speech: Speech normal.        Behavior: Behavior normal.      No results found for any visits on 06/04/23.  The ASCVD Risk score (Arnett DK, et al., 2019) failed to calculate for the following reasons:   The valid total cholesterol range is 130 to 320 mg/dL    Assessment & Plan:   Problem List Items Addressed This Visit       Cardiovascular and Mediastinum   HTN (hypertension)    Hypertension currently at goal no change in medications in the sense that we are monitoring her on spironolactone only 100 mg daily also being used for  her liver disease        Respiratory   COPD (chronic obstructive pulmonary disease) (HCC)    Continue inhaled medication        Digestive   Alcoholic cirrhosis of liver with ascites (HCC)    As per gastroenterology      Relevant Orders   Comprehensive metabolic panel   CBC with Differential/Platelet   Protime-INR     Other   HLD (hyperlipidemia)    Cannot give statins      Other Visit Diagnoses     Need for immunization against influenza    -  Primary   Relevant Orders   Flu vaccine trivalent PF, 6mos and older(Flulaval,Afluria,Fluarix,Fluzone) (Completed)   Muscle cramps       Relevant Orders   CK   Comprehensive metabolic panel      Return in about 4 months (around 10/04/2023) for alcohol cirrhosis.  30 minutes spent complex problems high degree of problem solving and multisystems assessed Second opinion sent to another gastroenterology practice that takes Medicaid Shan Levans, MD

## 2023-06-04 NOTE — Assessment & Plan Note (Signed)
Continue inhaled medication

## 2023-06-05 ENCOUNTER — Telehealth: Payer: Self-pay

## 2023-06-05 LAB — COMPREHENSIVE METABOLIC PANEL
ALT: 25 IU/L (ref 0–32)
AST: 30 IU/L (ref 0–40)
Albumin: 4.3 g/dL (ref 3.9–4.9)
Alkaline Phosphatase: 142 IU/L — ABNORMAL HIGH (ref 44–121)
BUN/Creatinine Ratio: 21 (ref 12–28)
BUN: 20 mg/dL (ref 8–27)
Bilirubin Total: 0.3 mg/dL (ref 0.0–1.2)
CO2: 24 mmol/L (ref 20–29)
Calcium: 9.9 mg/dL (ref 8.7–10.3)
Chloride: 102 mmol/L (ref 96–106)
Creatinine, Ser: 0.96 mg/dL (ref 0.57–1.00)
Globulin, Total: 3.4 g/dL (ref 1.5–4.5)
Glucose: 104 mg/dL — ABNORMAL HIGH (ref 70–99)
Potassium: 4.6 mmol/L (ref 3.5–5.2)
Sodium: 141 mmol/L (ref 134–144)
Total Protein: 7.7 g/dL (ref 6.0–8.5)
eGFR: 66 mL/min/{1.73_m2} (ref >59)

## 2023-06-05 LAB — CBC WITH DIFFERENTIAL/PLATELET
Basophils Absolute: 0.1 10*3/uL (ref 0.0–0.2)
Basos: 1 %
EOS (ABSOLUTE): 0.1 10*3/uL (ref 0.0–0.4)
Eos: 1 %
Hematocrit: 45.1 % (ref 34.0–46.6)
Hemoglobin: 15.7 g/dL (ref 11.1–15.9)
Immature Grans (Abs): 0.1 10*3/uL (ref 0.0–0.1)
Immature Granulocytes: 1 %
Lymphocytes Absolute: 2.5 10*3/uL (ref 0.7–3.1)
Lymphs: 25 %
MCH: 30.4 pg (ref 26.6–33.0)
MCHC: 34.8 g/dL (ref 31.5–35.7)
MCV: 87 fL (ref 79–97)
Monocytes Absolute: 1.1 10*3/uL — ABNORMAL HIGH (ref 0.1–0.9)
Monocytes: 11 %
Neutrophils Absolute: 6.4 10*3/uL (ref 1.4–7.0)
Neutrophils: 61 %
Platelets: 208 10*3/uL (ref 150–450)
RBC: 5.17 x10E6/uL (ref 3.77–5.28)
RDW: 13.2 % (ref 11.7–15.4)
WBC: 10.3 10*3/uL (ref 3.4–10.8)

## 2023-06-05 LAB — PROTIME-INR
INR: 1 (ref 0.9–1.2)
Prothrombin Time: 11.7 s (ref 9.1–12.0)

## 2023-06-05 LAB — CK: Total CK: 136 U/L (ref 32–182)

## 2023-06-05 NOTE — Progress Notes (Signed)
Let pt know all labs normal

## 2023-06-05 NOTE — Telephone Encounter (Signed)
-----   Message from Shan Levans sent at 06/05/2023  6:23 AM EDT ----- Let pt know all labs normal

## 2023-06-05 NOTE — Telephone Encounter (Signed)
Pt was called and is aware of results, DOB was confirmed.  ?

## 2023-06-14 ENCOUNTER — Ambulatory Visit: Payer: Medicaid Other | Attending: Critical Care Medicine

## 2023-06-14 DIAGNOSIS — Z23 Encounter for immunization: Secondary | ICD-10-CM | POA: Diagnosis not present

## 2023-06-14 NOTE — Progress Notes (Signed)
Shingrix vaccine administered  LEFT DELTOID per protocols.  Information sheet given. Patient denies and pain or discomfort at injection site. Tolerated injection well no reaction.

## 2023-06-24 ENCOUNTER — Other Ambulatory Visit: Payer: Self-pay | Admitting: Critical Care Medicine

## 2023-06-25 ENCOUNTER — Other Ambulatory Visit: Payer: Self-pay

## 2023-06-26 ENCOUNTER — Other Ambulatory Visit: Payer: Self-pay

## 2023-06-26 MED ORDER — HYDROXYZINE HCL 25 MG PO TABS
25.0000 mg | ORAL_TABLET | Freq: Every day | ORAL | 0 refills | Status: DC | PRN
  Filled 2023-06-26: qty 30, 30d supply, fill #0

## 2023-06-26 NOTE — Telephone Encounter (Signed)
Requested Prescriptions  Pending Prescriptions Disp Refills   hydrOXYzine (ATARAX) 25 MG tablet 30 tablet 0    Sig: Take 1 tablet (25 mg total) by mouth daily as needed for itching.     Ear, Nose, and Throat:  Antihistamines 2 Passed - 06/24/2023 10:17 PM      Passed - Cr in normal range and within 360 days    Creat  Date Value Ref Range Status  02/19/2014 0.79 0.50 - 1.10 mg/dL Final   Creatinine, Ser  Date Value Ref Range Status  06/04/2023 0.96 0.57 - 1.00 mg/dL Final         Passed - Valid encounter within last 12 months    Recent Outpatient Visits           3 weeks ago Alcoholic cirrhosis of liver with ascites [facility])   [place] [facility] Storm Frisk, MD   2 months ago Alcoholic cirrhosis of liver with ascites [facility])   [place] [facility] Storm Frisk, MD   6 months ago Alcoholic cirrhosis of liver with ascites [facility])   [place] [facility] Storm Frisk, MD   9 months ago Alcoholic cirrhosis of liver with ascites [facility])   [place] [facility] Storm Frisk, MD   1 year ago Alcoholic cirrhosis of liver with ascites [facility])   [place] [facility] Storm Frisk, MD

## 2023-07-01 ENCOUNTER — Other Ambulatory Visit: Payer: Self-pay

## 2023-07-24 ENCOUNTER — Other Ambulatory Visit: Payer: Self-pay

## 2023-07-24 ENCOUNTER — Other Ambulatory Visit: Payer: Self-pay | Admitting: Critical Care Medicine

## 2023-07-24 MED ORDER — HYDROXYZINE HCL 25 MG PO TABS
25.0000 mg | ORAL_TABLET | Freq: Every day | ORAL | 0 refills | Status: DC | PRN
Start: 2023-07-24 — End: 2023-11-24
  Filled 2023-07-24: qty 30, 30d supply, fill #0
  Filled 2023-09-25: qty 30, 30d supply, fill #1
  Filled 2023-10-25: qty 30, 30d supply, fill #2

## 2023-07-24 MED ORDER — FOLIC ACID 1 MG PO TABS
1.0000 mg | ORAL_TABLET | Freq: Every day | ORAL | 0 refills | Status: DC
  Filled 2023-07-24: qty 90, 90d supply, fill #0

## 2023-07-25 ENCOUNTER — Other Ambulatory Visit: Payer: Self-pay

## 2023-08-02 ENCOUNTER — Other Ambulatory Visit: Payer: Self-pay

## 2023-08-02 ENCOUNTER — Encounter: Payer: Self-pay | Admitting: Gastroenterology

## 2023-08-02 ENCOUNTER — Other Ambulatory Visit (INDEPENDENT_AMBULATORY_CARE_PROVIDER_SITE_OTHER): Payer: Medicaid Other

## 2023-08-02 ENCOUNTER — Ambulatory Visit: Payer: Medicaid Other | Admitting: Gastroenterology

## 2023-08-02 VITALS — BP 122/68 | HR 83 | Ht 67.0 in | Wt 161.0 lb

## 2023-08-02 DIAGNOSIS — Z800 Family history of malignant neoplasm of digestive organs: Secondary | ICD-10-CM

## 2023-08-02 DIAGNOSIS — K7031 Alcoholic cirrhosis of liver with ascites: Secondary | ICD-10-CM

## 2023-08-02 DIAGNOSIS — J449 Chronic obstructive pulmonary disease, unspecified: Secondary | ICD-10-CM | POA: Diagnosis not present

## 2023-08-02 DIAGNOSIS — K754 Autoimmune hepatitis: Secondary | ICD-10-CM | POA: Diagnosis not present

## 2023-08-02 LAB — GAMMA GT: GGT: 52 U/L — ABNORMAL HIGH (ref 7–51)

## 2023-08-02 MED ORDER — FUROSEMIDE 20 MG PO TABS
20.0000 mg | ORAL_TABLET | Freq: Every day | ORAL | 3 refills | Status: DC
  Filled 2023-08-02 – 2023-08-28 (×2): qty 30, 30d supply, fill #0
  Filled 2023-10-07: qty 30, 30d supply, fill #1
  Filled 2023-11-12: qty 30, 30d supply, fill #2
  Filled 2023-12-09: qty 30, 30d supply, fill #3

## 2023-08-02 MED ORDER — SPIRONOLACTONE 50 MG PO TABS
50.0000 mg | ORAL_TABLET | Freq: Every day | ORAL | 2 refills | Status: DC
  Filled 2023-08-02 – 2023-09-26 (×2): qty 30, 30d supply, fill #0
  Filled 2023-10-25: qty 30, 30d supply, fill #1
  Filled 2023-12-02: qty 30, 30d supply, fill #2

## 2023-08-02 MED ORDER — LACTULOSE 10 GM/15ML PO SOLN
10.0000 g | Freq: Two times a day (BID) | ORAL | 2 refills | Status: DC
  Filled 2023-08-02 – 2023-10-07 (×2): qty 900, 30d supply, fill #0
  Filled 2023-11-12: qty 900, 30d supply, fill #1
  Filled 2023-12-09: qty 900, 30d supply, fill #2

## 2023-08-02 NOTE — Patient Instructions (Addendum)
Your provider has requested that you go to the basement level for lab work before leaving today. Press "B" on the elevator. The lab is located at the first door on the left as you exit the elevator.   You will be contacted by [facility] in the next 2 days to arrange a Liver Biopsy.  The number on your caller ID will be [phone], please answer when they call.  If you have not heard from them in 2 days please call [phone] to schedule.    Decrease Lasix to 20 mg daily.  Decrease Aldactone to 50 mg daily.  Decrease lactulose to 1-2 times daily.   _______________________________________________________  If your blood pressure at your visit was 140/90 or greater, please contact your primary care physician to follow up on this.  _______________________________________________________  If you are age 65 or older, your body mass index should be between 23-30. Your Body mass index is 25.22 kg/m. If this is out of the aforementioned range listed, please consider follow up with your Primary Care Provider.  If you are age 29 or younger, your body mass index should be between 19-25. Your Body mass index is 25.22 kg/m. If this is out of the aformentioned range listed, please consider follow up with your Primary Care Provider.   ________________________________________________________  The [place] [facility] providers would like to encourage you to use [facility] to communicate with providers for non-urgent requests or questions.  Due to long hold times on the telephone, sending your provider a message by [facility] may be a faster and more efficient way to get a response.  Please allow 48 business hours for a response.  Please remember that this is for non-urgent requests.   It was a pleasure to see you today!  Thank you for trusting me with your gastrointestinal care!     Scott E.Tomasa Rand, MD

## 2023-08-02 NOTE — Progress Notes (Signed)
HPI : Angel [name] is a 67 y.o. 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   She was admitted to the hospital on March 12, 2022 with newly diagnosed cirrhosis and abdominal pain.  She had a history of heavy alcohol use in the past. During admission, she was found to have elevated LFTs with T. bili of 3, AST 462, ALT 287 and alkaline phosphatase of 162. Mild elevated INR at 1.5.  She was followed by [facility] during that admission, and followed up with Dr. Levora Angel as an outpatient.  Further work-up revealed negative hepatitis panel, elevated ASMA at 136, normal AMA, normal ANA ,normal alpha-1 antitrypsin level, normal ceruloplasmin, mild elevated ferritin at 353, elevated iron saturation at 81%, negative hemochromatosis gene evaluation. Mild elevated AFP tumor marker at 11.9. CT abdomen pelvis with contrast on April 04, 2022 showed cirrhosis of the liver with mild scattered ascites. She underwent paracentesis with removal of 3 L of fluid on April 12, 2022. Fluid analysis was negative for SBP but she was started on Bactrim DS for 14 days because of elevated white counts. Patient was started on Lasix 40 mg, spironolactone 100 mg and lactulose at the time of discharge.  She was started on steroids at some point after her discharge. It does not appear she underwent a liver biopsy.  An IgG level was not obtained.  She stopped drinking alcohol in June 2023.  Prior to that, she drank on average 2 glasses of wine and 2 shots of liquor per day most days of the week.  She reports she had been drinking regularly for the past 40+ years.  At her last visit with Dr. Levora Angel in November 2023, she was started on azathioprine 50mg  daily and her prednisone was decreased to 5 mg PO daily.  She was recommended to undergo EGD and colonoscopy but she did not have insurance.  She reports she only took the Imuran for  a few weeks before stopping it because of GI symptoms.  She has been maintained on low-dose prednisone for the past year or so.  Her liver enzymes improved significantly with alcohol cessation and steroids, but she does still have an elevated alkaline phosphatase.  Her INR and T. bili have normalized.  Her last ultrasound was in July of this year and showed a cirrhotic appearing liver, but no concerning liver lesions and no ascites.  Her mother was diagnosed with colon cancer at age 78.  She has never had a colonoscopy. She reports having a bowel movement about twice a day.  Her stools are typically formed and sometimes hard.  She continues to take lactulose 3 times a day.  She was under the impression that the lactulose was to help her go to the bathroom.  It is unclear if she has been formally diagnosed with hepatic encephalopathy.  She denies any history of confusion, brain fog or other symptoms of HE. She complains of chronic abdominal bloating and distention.  She has not had any recurrence of ascites since shortly after initial presentation. She reported swelling in the legs when she was initially diagnosed, but this has not been a problem for some time.  She does report issues with thin skin and easy bruising.  She reports a lot of autoimmune disease in her family.  She has multiple cousins, aunts and uncles with lupus and rheumatoid arthritis.  Her sister  has an autoimmune disease that she does not know the name of.  She denies any family history of liver disease specifically.  She has COPD and continues to smoke cigarettes.  Currently smoking 5 or 6 cigarettes/day.  She has never been recommended to use supplemental oxygen.  She is able to go up a flight of stairs without stopping.   CT Abdomen/Pelvis April 04, 2022 IMPRESSION: Cirrhotic liver with scattered ascites and small collaterals in LEFT upper quadrant anterior abdomen and perigastric region. Thickened gallbladder wall, nonspecific  in the setting of ascites and cirrhosis. No other acute intra-abdominal or intrapelvic abnormalities. Aortic Atherosclerosis (ICD10-I70.0)  RUQUS April 04, 2022 IMPRESSION: Changes of cirrhosis.  Perihepatic ascites. Few small layering gravel stones. Mild gallbladder wall thickening. No sonographic Murphy sign. Wall thickening most likely related to liver disease, less likely cholecystitis.  RUQUS Sept 15, 2023 IMPRESSION: Minimal right lower quadrant ascites, insufficient for paracentesis.  CT Abdomen/Pelvis Aug 06, 2022 IMPRESSION: 1. No acute intra-abdominal pathology identified. No definite radiographic explanation for the patient's reported symptoms. 2. Cirrhotic hepatic morphology. Asymmetric contour nodularity within the left hepatic lobe which demonstrates subtle hypoenhancement on delayed phase images which may represent washout and is suspicious for a developing mass. This is not optimally characterized on this examination. Correlation with serum AFP level and contrast enhanced MRI examination is recommended for further evaluation. 3. Cholelithiasis. 4. Aortic atherosclerosis  MRI Abdomen Aug 07, 2022 IMPRESSION: Advanced macronodular hepatic cirrhosis. No evidence of hepatocellular carcinoma or other acute findings  RUQUS July 2024 IMPRESSION: 1. Cirrhotic liver morphology. No focal hepatic lesion identified. 2. Cholelithiasis without sonographic evidence of acute cholecystitis. 3. No ascites identified  MELD 3.0: 7 at 06/04/2023  2:11 PM MELD-Na: 6 at 06/04/2023  2:11 PM Calculated from: Serum Creatinine: 0.96 mg/dL (Using min of 1 mg/dL) at 6/96/2952  8:41 PM Serum Sodium: 141 mmol/L (Using max of 137 mmol/L) at 06/04/2023  2:11 PM Total Bilirubin: 0.3 mg/dL (Using min of 1 mg/dL) at 12/29/4008  2:72 PM Serum Albumin: 4.3 g/dL (Using max of 3.5 g/dL) at 5/36/6440  3:47 PM INR(ratio): 1.0 at 06/04/2023  2:11 PM Age at listing (hypothetical): 66 years Sex:  Female at 06/04/2023  2:11 PM     Component Ref Range & Units 1 yr ago  F-Actin IgG 0 - 19 Units 136  Comment: (NOTE) Negative 0 - 19 Weak positive 20 - 30 Moderate to strong positive >30 Actin Antibodies are found in 52-85% of patients with autoimmune hepatitis or chronic active hepatitis and in 22% of patients with primary biliary cirrhosis. Performed At: [facility] [address] [place], [place] [id number] Jolene Schimke MD FI:[id number]      Past Medical History:  Diagnosis Date   Alcohol use 04/19/2022   Alcoholic cirrhosis (HCC)    Asthma    Bronchiolitis    COPD (chronic obstructive pulmonary disease) (HCC)    Hypertension    Smoker 12/31/2013     Past Surgical History:  Procedure Laterality Date   ABDOMINAL HYSTERECTOMY     APPENDECTOMY     BACK SURGERY     Family History  Problem Relation Age of Onset   Breast cancer Mother    Colon cancer Mother    CAD Father    Heart disease Father    Breast cancer Sister    Emphysema Paternal Grandfather        never smoker, Theatre stage manager   Social History   Tobacco Use   Smoking status: Former  Current packs/day: 0.00    Average packs/day: 0.5 packs/day for 30.0 years (15.0 ttl pk-yrs)    Types: Cigarettes    Start date: 12/31/1983    Quit date: 12/30/2013    Years since quitting: 9.5   Smokeless tobacco: Never  Substance Use Topics   Alcohol use: Yes    Alcohol/week: 28.0 standard drinks of alcohol    Types: 28 Glasses of wine per week   Drug use: Yes    Frequency: 3.0 times per week    Types: Marijuana   Current Outpatient Medications  Medication Sig Dispense Refill   albuterol (VENTOLIN HFA) 108 (90 Base) MCG/ACT inhaler Inhale 2 puffs into the lungs every 6 (six) hours as needed for wheezing or shortness of breath. 54 g 1   budesonide-formoterol (SYMBICORT) 80-4.5 MCG/ACT inhaler Inhale 2 puffs into the lungs 2 (two) times daily. 10.2 g 3   folic acid (FOLVITE) 1 MG tablet Take 1 tablet  (1 mg total) by mouth daily. 90 tablet 0   furosemide (LASIX) 20 MG tablet Take 2 tablets (40 mg total) by mouth daily. For one week then one daily 30 tablet 4   hydrOXYzine (ATARAX) 25 MG tablet Take 1 tablet (25 mg total) by mouth daily as needed for itching. 90 tablet 0   ipratropium-albuterol (DUONEB) 0.5-2.5 (3) MG/3ML SOLN Take 3 mLs by nebulization every 6 (six) hours as needed. 360 mL 0   lactulose (CHRONULAC) 10 GM/15ML solution Take 15 mLs (10 g total) by mouth 3 (three) times daily. 1419 mL 2   methocarbamol (ROBAXIN) 500 MG tablet Take 1 tablet (500 mg total) by mouth every 6 (six) hours as needed for muscle spasms. 60 tablet 1   Multiple Vitamins-Minerals (CENTRUM SILVER PO) Take 1 tablet by mouth daily.     omeprazole (PRILOSEC) 20 MG capsule Take 1 capsule (20 mg total) by mouth daily. 60 capsule 2   OVER THE COUNTER MEDICATION Apply 1 application  topically 2 (two) times daily as needed (joint pain). Tiger Balm PO     OVER THE COUNTER MEDICATION Apply 1 Application topically 2 (two) times daily as needed (joint pain). Frankincense oil     predniSONE (DELTASONE) 5 MG tablet Take 1 tablet (5 mg total) by mouth daily. 90 tablet 1   spironolactone (ALDACTONE) 100 MG tablet Take 1 tablet (100 mg total) by mouth daily. 30 tablet 3   thiamine (VITAMIN B1) 100 MG tablet Take 1 tablet (100 mg total) by mouth daily. 100 tablet 0   Trolamine Salicylate (ASPERCREME EX) Apply 1 Application topically See admin instructions. 1 application 3-4 times a day as needed for joint pain     Vitamin D, Ergocalciferol, (DRISDOL) 1.25 MG (50000 UNIT) CAPS capsule Take 1 capsule (50,000 Units total) by mouth every 7 (seven) days. 12 capsule 5   No current facility-administered medications for this visit.   Allergies  Allergen Reactions   Penicillins Hives     Review of Systems: All systems reviewed and negative except where noted in HPI.    No results found.  Physical Exam: BP 122/68   Pulse 83    Ht 5\' 7"  (1.702 m)   Wt 161 lb (73 kg)   BMI 25.22 kg/m  Constitutional: Pleasant,well-developed, Caucasian female in no acute distress. HEENT: Normocephalic and atraumatic. Conjunctivae are normal. No scleral icterus. Neck supple.  Cardiovascular: Normal rate, regular rhythm.  Pulmonary/chest: Slight increased work of breathing noted during visit.  Harsh expiratory wheezes bilaterally throughout both lung fields.  Abdominal: Soft, mildly distended, not tense or tympanic, nontender. Bowel sounds active throughout. There are no masses palpable. No hepatomegaly. Extremities: no edema Neurological: Alert and oriented to person place and time.  No asterixis Skin: Skin is warm and dry. No rashes noted. Psychiatric: Normal mood and affect. Behavior is normal.  CBC    Component Value Date/Time   WBC 10.3 06/04/2023 1411   WBC 7.2 08/06/2022 1735   RBC 5.17 06/04/2023 1411   RBC 4.70 08/06/2022 1735   HGB 15.7 06/04/2023 1411   HCT 45.1 06/04/2023 1411   PLT 208 06/04/2023 1411   MCV 87 06/04/2023 1411   MCH 30.4 06/04/2023 1411   MCH 32.6 08/06/2022 1735   MCHC 34.8 06/04/2023 1411   MCHC 33.9 08/06/2022 1735   RDW 13.2 06/04/2023 1411   LYMPHSABS 2.5 06/04/2023 1411   MONOABS 0.7 08/06/2022 1735   EOSABS 0.1 06/04/2023 1411   BASOSABS 0.1 06/04/2023 1411    CMP     Component Value Date/Time   NA 141 06/04/2023 1411   K 4.6 06/04/2023 1411   CL 102 06/04/2023 1411   CO2 24 06/04/2023 1411   GLUCOSE 104 (H) 06/04/2023 1411   GLUCOSE 181 (H) 08/06/2022 1735   BUN 20 06/04/2023 1411   CREATININE 0.96 06/04/2023 1411   CREATININE 0.79 02/19/2014 1226   CALCIUM 9.9 06/04/2023 1411   PROT 7.7 06/04/2023 1411   ALBUMIN 4.3 06/04/2023 1411   AST 30 06/04/2023 1411   ALT 25 06/04/2023 1411   ALKPHOS 142 (H) 06/04/2023 1411   BILITOT 0.3 06/04/2023 1411   GFRNONAA 48 (L) 08/06/2022 1735   GFRNONAA 85 02/19/2014 1226   GFRAA >60 04/17/2019 0514   GFRAA >89 02/19/2014 1226        Latest Ref Rng & Units 06/04/2023    2:11 PM 04/16/2023    2:58 PM 12/11/2022    3:04 PM  CBC EXTENDED  WBC 3.4 - 10.8 x10E3/uL 10.3  9.3  9.2   RBC 3.77 - 5.28 x10E6/uL 5.17  5.34  4.80   Hemoglobin 11.1 - 15.9 g/dL 84.6  96.2  95.2   HCT 34.0 - 46.6 % 45.1  48.8  43.8   Platelets 150 - 450 x10E3/uL 208  200  168   NEUT# 1.4 - 7.0 x10E3/uL 6.4  5.3  5.2   Lymph# 0.7 - 3.1 x10E3/uL 2.5  2.6  2.5       ASSESSMENT AND PLAN:  67 year old female diagnosed with cirrhosis with ascites in June of last year.  She has a longstanding history of heavy alcohol use, but had an elevated smooth muscle antibody.  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  She was transition to Imuran, but she stopped this due to side effects.  She has been maintained on low-dose steroids (5-10 mg) for about a year now.  Her liver enzymes, INR and platelets have improved significantly over the past year.  Her MELD is now 7.  She does have a strong family history of autoimmune disease, so autoimmune hepatitis is certainly possible.  However, given the underlying heavy alcohol use, her cirrhosis could be entirely secondary to alcohol and the antibody could be a false positive.  Before committing her to long-term steroid use or trying other alternative medications for autoimmune hepatitis, I would like to pursue a liver biopsy to see if there is any evidence for autoimmune hepatitis.  Granted, given the significant improvement in her liver enzymes  over the past year, it may be difficult to find histologic evidence of autoimmune hepatitis.  If the biopsy does not show any evidence of autoimmune hepatitis, it may be reasonable to stop steroids for a period and monitor her liver enzymes.  Additionally, the role of chronic steroids/immunosuppression for autoimmune hepatitis in the setting of decompensated cirrhosis is questionable.  The patient has a persistently elevated alkaline  phosphatase, which is atypical for autoimmune hepatitis.  It could be that she has features of PBC.  The liver biopsy will help evaluate for this.  Will plan to get a GGT and fractionated alkaline phosphatase to figure out if this alkaline phosphatase is indeed hepatic in etiology.  I think we can de-escalate some of her medications given the improvement in her liver function.  Will plan to decrease Lasix to 20 mg daily and spironolactone to 50 mg daily. Given the absence of clear history of hepatic encephalopathy, recommended we start decreasing her lactulose to once or twice a day.  She is a little hesitant to stop it completely.  Lactulose could possibly be contributing to her bloating and gas.  The patient needs a colonoscopy for high risk colon cancer screening.  She also warrants an EGD for variceal screening (although her normalized platelet counts certainly decrease the likelihood of clinically significant varices).  She had fairly prominent wheezing on exam, she reports she has been coughing a lot recently.  She reports that her lungs are always wheezy when she is seen, but admits that her symptoms have been a little worse recently.  Given that her endoscopic procedures are not urgent, I recommended we hold off little bit more until her pulmonary function is a little bit better.  We will plan to follow-up with her in 2 months to reassess and then possibly schedule her for her EGD and colonoscopy at that time.    Cirrhosis with history of ascites, alcohol-induced with suspected autoimmune component (elevated smooth muscle antibody titer) -Refer to IR for liver biopsy to assess for evidence of autoimmune hepatitis - Obtain IgG level - Repeat smooth muscle antibodies - Follow-up in 2 months, tentatively schedule EGD at that time if pulmonary function improved -Continue prednisone 5 mg daily for now - Repeat right upper quadrant ultrasound January 2025  Ascites, well-controlled - Decrease  Lasix to 20 mg - Decrease Aldactone to 50 mg - Low-sodium diet  Elevated alkaline phosphatase - Likely related to underlying cirrhosis - Question whether related to possible PBC overlap - Liver biopsy as above - GGT, fractionated alk phos  Hepatic encephalopathy? - No clear clinical history, but patient is on lactulose - Recommended decreasing lactulose to once or twice a day  Family history of colon cancer - Well overdue for initial high risk screening colonoscopy - Follow-up in 2 months, scheduled for colonoscopy if pulmonary status improved  Chronic tobacco use, COPD - Follow-up with PCP -Smoking cessation  Mcdaniel Ohms E. Tomasa Rand, MD [facility]  I spent a total of 65 minutes reviewing the patient's medical record, interviewing and examining the patient, discussing her diagnosis and management of her condition going forward, and documenting in the medical record     Storm Frisk, MD

## 2023-08-06 LAB — IGG: IgG (Immunoglobin G), Serum: 1339 mg/dL (ref 600–1540)

## 2023-08-06 LAB — ANTI-SMOOTH MUSCLE ANTIBODY, IGG: Actin (Smooth Muscle) Antibody (IGG): 35 U — ABNORMAL HIGH (ref <20)

## 2023-08-09 ENCOUNTER — Ambulatory Visit: Payer: Medicaid Other

## 2023-08-09 LAB — ALKALINE PHOSPHATASE, ISOENZYMES
Alkaline Phosphatase: 130 [IU]/L — ABNORMAL HIGH (ref 44–121)
BONE FRACTION: 35 % (ref 14–68)
INTESTINAL FRAC.: 20 % — ABNORMAL HIGH (ref 0–18)
LIVER FRACTION: 45 % (ref 18–85)

## 2023-08-12 NOTE — Progress Notes (Signed)
Angel [name],  Your labs showed a persistent, but decreased, level of antibodies associated with autoimmune hepatitis.  Your fractionated alkaline phosphatase did not show a significantly increased percentage of hepatic alkaline phosphatase, but did show increased intestinal fraction.  This can be seen in cirrhosis. Will await the results of your liver biopsy to discuss further management of your liver disease.

## 2023-08-14 ENCOUNTER — Other Ambulatory Visit: Payer: Self-pay | Admitting: Physician Assistant

## 2023-08-14 ENCOUNTER — Other Ambulatory Visit: Payer: Self-pay

## 2023-08-14 DIAGNOSIS — Z01818 Encounter for other preprocedural examination: Secondary | ICD-10-CM

## 2023-08-15 ENCOUNTER — Ambulatory Visit (HOSPITAL_COMMUNITY)
Admission: RE | Admit: 2023-08-15 | Discharge: 2023-08-15 | Disposition: A | Discharge status: Home / Self Care | Payer: Medicare Other | Source: Ambulatory Visit | Attending: Gastroenterology | Admitting: Gastroenterology

## 2023-08-15 ENCOUNTER — Encounter (HOSPITAL_COMMUNITY): Payer: Self-pay

## 2023-08-15 ENCOUNTER — Other Ambulatory Visit: Payer: Self-pay

## 2023-08-15 DIAGNOSIS — J4489 Other specified chronic obstructive pulmonary disease: Secondary | ICD-10-CM | POA: Insufficient documentation

## 2023-08-15 DIAGNOSIS — Z01818 Encounter for other preprocedural examination: Secondary | ICD-10-CM

## 2023-08-15 DIAGNOSIS — Z01812 Encounter for preprocedural laboratory examination: Secondary | ICD-10-CM | POA: Insufficient documentation

## 2023-08-15 DIAGNOSIS — Z87891 Personal history of nicotine dependence: Secondary | ICD-10-CM | POA: Insufficient documentation

## 2023-08-15 DIAGNOSIS — I10 Essential (primary) hypertension: Secondary | ICD-10-CM | POA: Insufficient documentation

## 2023-08-15 DIAGNOSIS — K7031 Alcoholic cirrhosis of liver with ascites: Secondary | ICD-10-CM | POA: Insufficient documentation

## 2023-08-15 LAB — PROTIME-INR
INR: 1.1 (ref 0.8–1.2)
Prothrombin Time: 13.9 s (ref 11.4–15.2)

## 2023-08-15 LAB — CBC
HCT: 44.9 % (ref 36.0–46.0)
Hemoglobin: 14.9 g/dL (ref 12.0–15.0)
MCH: 30.1 pg (ref 26.0–34.0)
MCHC: 33.2 g/dL (ref 30.0–36.0)
MCV: 90.7 fL (ref 80.0–100.0)
Platelets: 212 10*3/uL (ref 150–400)
RBC: 4.95 MIL/uL (ref 3.87–5.11)
RDW: 13.1 % (ref 11.5–15.5)
WBC: 11.7 10*3/uL — ABNORMAL HIGH (ref 4.0–10.5)
nRBC: 0 % (ref 0.0–0.2)

## 2023-08-15 MED ORDER — GELATIN ABSORBABLE 12-7 MM EX MISC
CUTANEOUS | Status: AC
  Filled 2023-08-15: qty 1

## 2023-08-15 MED ORDER — FENTANYL CITRATE (PF) 100 MCG/2ML IJ SOLN
INTRAMUSCULAR | Status: AC | PRN
  Administered 2023-08-15: 50 ug via INTRAVENOUS
  Administered 2023-08-15: 100 ug via INTRAVENOUS
  Administered 2023-08-15: 50 ug via INTRAVENOUS

## 2023-08-15 MED ORDER — MIDAZOLAM HCL 2 MG/2ML IJ SOLN
INTRAMUSCULAR | Status: AC
  Filled 2023-08-15: qty 2

## 2023-08-15 MED ORDER — HYDROCODONE-ACETAMINOPHEN 5-325 MG PO TABS
1.0000 | ORAL_TABLET | ORAL | Status: DC | PRN
  Administered 2023-08-15 (×2): 1 via ORAL
  Filled 2023-08-15 (×2): qty 1

## 2023-08-15 MED ORDER — FENTANYL CITRATE (PF) 100 MCG/2ML IJ SOLN
INTRAMUSCULAR | Status: AC
  Filled 2023-08-15: qty 2

## 2023-08-15 MED ORDER — LIDOCAINE HCL 1 % IJ SOLN
INTRAMUSCULAR | Status: AC
  Filled 2023-08-15: qty 20

## 2023-08-15 MED ORDER — SODIUM CHLORIDE 0.9 % IV SOLN: INTRAVENOUS | Status: DC

## 2023-08-15 MED ORDER — MIDAZOLAM HCL 2 MG/2ML IJ SOLN
INTRAMUSCULAR | Status: AC | PRN
  Administered 2023-08-15 (×2): 1 mg via INTRAVENOUS

## 2023-08-15 NOTE — Discharge Instructions (Signed)
Liver Biopsy, Care After  May remove dressing or bandaid and shower tomorrow.  Keep site clean and dry. Replace with clean dressing or bandaid as necessary.   Urgent needs - Interventional Radiology clinic [phone]  After a liver biopsy, it is common to have these things in the area where the biopsy was done. You may: Have pain. Feel sore. Have bruising. You may also feel tired for a few days. Follow these instructions at home: Medicines Take over-the-counter and prescription medicines only as told by your doctor. If you were prescribed an antibiotic medicine, take it as told by your doctor. Do not stop taking the antibiotic, even if you start to feel better. Do not take medicines that may thin your blood. These medicines include aspirin and ibuprofen. Take them only if your doctor tells you to. If told, take steps to prevent problems with pooping (constipation). You may need to: Drink enough fluid to keep your pee (urine) pale yellow. Take medicines. You will be told what medicines to take. Eat foods that are high in fiber. These include beans, whole grains, and fresh fruits and vegetables. Limit foods that are high in fat and sugar. These include fried or sweet foods. Ask your doctor if you should avoid driving or using machines while you are taking your medicine. Caring for your incision Follow instructions from your doctor about how to take care of your cut from surgery (incisions). Make sure you: Wash your hands with soap and water for at least 20 seconds before and after you change your bandage. If you cannot use soap and water, use hand sanitizer. Change your bandage. Leavestitches or skin glue in place for at least two weeks. Leave tape strips alone unless you are told to take them off. You may trim the edges of the tape strips if they curl up. Check your incision every day for signs of infection. Check for: Redness, swelling, or more pain. Fluid or blood. Warmth. Pus or a  bad smell. Do not take baths, swim, or use a hot tub. Ask your doctor about taking showers or sponge baths. Activity Rest at home for 1-2 days, or as told by your doctor. Get up to take short walks every 1 to 2 hours. Ask for help if you feel weak or unsteady. Do not lift anything that is heavier than 10 lb (4.5 kg), or the limit that you are told. Do not play contact sports for 2 weeks after the procedure. Return to your normal activities as told by your doctor. Ask what activities are safe for you. General instructions Do not drink alcohol in the first week after the procedure. Plan to have a responsible adult care for you for the time you are told after you leave the hospital or clinic. This is important. It is up to you to get the results of your procedure. Ask how to get your results when they are ready. Keep all follow-up visits.   Contact a doctor if: You have more bleeding in your incision. Your incision swells, or is red and more painful. You have fluid that comes from your incision. You develop a rash. You have fever or chills. Get help right away if: You have swelling, bloating, or pain in your belly (abdomen). You get dizzy or faint. You vomit or you feel like vomiting. You have trouble breathing or feel short of breath. You have chest pain. You have problems talking or seeing. You have trouble with your balance or moving your arms or   legs. These symptoms may be an emergency. Get help right away. Call your local emergency services (911 in the [place] Do not wait to see if the symptoms will go away. Do not drive yourself to the hospital. Summary After the procedure, it is common to have pain, soreness, bruising, and tiredness. Your doctor will tell you how to take care of yourself at home. Change your bandage, take your medicines, and limit your activities as told by your doctor. Call your doctor if you have symptoms of infection. Get help right away if your belly swells,  your cut bleeds a lot, or you have trouble talking or breathing. This information is not intended to replace advice given to you by your health care provider. Make sure you discuss any questions you have with your healthcare provider. Document Revised: 08/08/2020 Document Reviewed: 08/08/2020 Elsevier Patient Education  2022 [employer] Inc.  Moderate Conscious Sedation, Adult, Care After This sheet gives you information about how to care for yourself after your procedure. Your health care provider may also give you more specific instructions. If you have problems or questions, contact your health careprovider. What can I expect after the procedure? After the procedure, it is common to have: Sleepiness for several hours. Impaired judgment for several hours. Difficulty with balance. Vomiting if you eat too soon. Follow these instructions at home: For the time period you were told by your health care provider: Rest. Do not participate in activities where you could fall or become injured. Do not drive or use machinery. Do not drink alcohol. Do not take sleeping pills or medicines that cause drowsiness. Do not make important decisions or sign legal documents. Do not take care of children on your own. Eating and drinking  Follow the diet recommended by your health care provider. Drink enough fluid to keep your urine pale yellow. If you vomit: Drink water, juice, or soup when you can drink without vomiting. Make sure you have little or no nausea before eating solid foods.  General instructions Take over-the-counter and prescription medicines only as told by your health care provider. Have a responsible adult stay with you for the time you are told. It is important to have someone help care for you until you are awake and alert. Do not smoke. Keep all follow-up visits as told by your health care provider. This is important. Contact a health care provider if: You are still sleepy or having  trouble with balance after 24 hours. You feel light-headed. You keep feeling nauseous or you keep vomiting. You develop a rash. You have a fever. You have redness or swelling around the IV site. Get help right away if: You have trouble breathing. You have new-onset confusion at home. Summary After the procedure, it is common to feel sleepy, have impaired judgment, or feel nauseous if you eat too soon. Rest after you get home. Know the things you should not do after the procedure. Follow the diet recommended by your health care provider and drink enough fluid to keep your urine pale yellow. Get help right away if you have trouble breathing or new-onset confusion at home. This information is not intended to replace advice given to you by your health care provider. Make sure you discuss any questions you have with your healthcare provider. Document Revised: 01/22/2020 Document Reviewed: 08/20/2019 Elsevier Patient Education  2022 Elsevier Inc.        

## 2023-08-15 NOTE — Procedures (Signed)
Interventional Radiology Procedure:   Indications: Evaluate for autoimmune hepatitis  Procedure: US guided liver biopsy  Findings: 3 core biopsies obtained from left hepatic lobe.  Gelfoam slurry injected along biopsy tract.  Complications: None     EBL: Minimal  Plan: Bedrest 3 hours  G[place]wood Revoir R. Lowella Dandy, MD  Pager: [phone]

## 2023-08-15 NOTE — H&P (Signed)
Chief Complaint: Patient was seen in consultation today for cirrhosis of the liver   Referring Physician(s): Cunningham,Scott E  Supervising Physician: Richarda Overlie  Patient Status: [facility] - Out-pt  History of Present Illness: Angel [name] is a 67 y.o. 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 Patient is under the care of Dr Tomasa Rand from GI who has referred patient to IR for further evaluation of patient's cirrhosis.   Past Medical History:  Diagnosis Date   Alcohol use 04/19/2022   Alcoholic cirrhosis (HCC)    Asthma    Bronchiolitis    Cirrhosis (HCC)    COPD (chronic obstructive pulmonary disease) (HCC)    Hypertension    Smoker 12/31/2013    Past Surgical History:  Procedure Laterality Date   ABDOMINAL HYSTERECTOMY     APPENDECTOMY     BACK SURGERY      Allergies: Penicillins  Medications: Prior to Admission medications   Medication Sig Start Date End Date Taking? Authorizing Provider  budesonide-formoterol (SYMBICORT) 80-4.5 MCG/ACT inhaler Inhale 2 puffs into the lungs 2 (two) times daily. 04/16/23  Yes Storm Frisk, MD  folic acid (FOLVITE) 1 MG tablet Take 1 tablet (1 mg total) by mouth daily. 07/24/23  Yes Hoy Register, MD  furosemide (LASIX) 20 MG tablet Take 1 tablet (20 mg total) by mouth daily. For one week then one daily 08/02/23  Yes Jenel Lucks, MD  hydrOXYzine (ATARAX) 25 MG tablet Take 1 tablet (25 mg total) by mouth daily as needed for itching. 07/24/23  Yes Hoy Register, MD  lactulose (CHRONULAC) 10 GM/15ML solution Take 15 mLs (10 g total) by mouth 2 (two) times daily. 08/02/23  Yes Jenel Lucks, MD  methocarbamol (ROBAXIN) 500 MG tablet Take 1 tablet (500 mg total) by mouth every 6 (six) hours as needed for muscle spasms. 04/16/23  Yes Storm Frisk, MD  Multiple  Vitamins-Minerals (CENTRUM SILVER PO) Take 1 tablet by mouth daily.   Yes [provider]  omeprazole (PRILOSEC) 20 MG capsule Take 1 capsule (20 mg total) by mouth daily. 04/16/23  Yes Storm Frisk, MD  OVER THE COUNTER MEDICATION Apply 1 application  topically 2 (two) times daily as needed (joint pain). Tiger Balm PO   Yes [provider]  OVER THE COUNTER MEDICATION Apply 1 Application topically 2 (two) times daily as needed (joint pain). Frankincense oil   Yes [provider]  predniSONE (DELTASONE) 5 MG tablet Take 1 tablet (5 mg total) by mouth daily. 04/16/23  Yes Storm Frisk, MD  spironolactone (ALDACTONE) 50 MG tablet Take 1 tablet (50 mg total) by mouth daily. 08/02/23  Yes Jenel Lucks, MD  thiamine (VITAMIN B1) 100 MG tablet Take 1 tablet (100 mg total) by mouth daily. 04/24/23  Yes Storm Frisk, MD  Trolamine Salicylate (ASPERCREME EX) Apply 1 Application topically See admin instructions. 1 application 3-4 times a day as needed for joint pain   Yes [provider]  Vitamin D, Ergocalciferol, (DRISDOL) 1.25 MG (50000 UNIT) CAPS capsule Take 1 capsule (50,000 Units total) by mouth every 7 (seven) days. 04/16/23  Yes Storm Frisk, MD  albuterol (VENTOLIN HFA) 108 (90 Base) MCG/ACT inhaler Inhale 2 puffs into the lungs every 6 (six) hours as needed for wheezing or shortness of breath. 12/11/22   Storm Frisk, MD  ipratropium-albuterol (DUONEB) 0.5-2.5 (3) MG/3ML SOLN Take 3 mLs by nebulization every 6 (six) hours as needed. 09/18/22   Storm Frisk, MD     Family History  Problem Relation Age of Onset   Breast cancer Mother    Colon cancer Mother    CAD Father    Heart disease Father    Breast cancer Sister    Emphysema Paternal Grandfather        never smoker, Theatre stage manager   Liver disease Neg Hx    Esophageal cancer Neg Hx     Social History   Socioeconomic History   Marital status: Single    Spouse name: Not on  file   Number of children: 0   Years of education: Not on file   Highest education level: Associate degree: occupational, Scientist, product/process development, or vocational program  Occupational History   Occupation: Unemployed  Tobacco Use   Smoking status: Former    Current packs/day: 0.00    Average packs/day: 0.5 packs/day for 30.0 years (15.0 ttl pk-yrs)    Types: Cigarettes    Start date: 12/31/1983    Quit date: 12/30/2013    Years since quitting: 9.6   Smokeless tobacco: Never  Vaping Use   Vaping status: Never Used  Substance and Sexual Activity   Alcohol use: Yes    Alcohol/week: 28.0 standard drinks of alcohol    Types: 28 Glasses of wine per week   Drug use: Yes    Frequency: 3.0 times per week    Types: Marijuana   Sexual activity: Not Currently  Other Topics Concern   Not on file  Social History Narrative   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    Never pregnant, NO husband   Social drinkier    Quite smoking 3/15   Social Determinants of Health   Financial Resource Strain: High Risk (04/12/2023)   Overall Financial Resource Strain (CARDIA)    Difficulty of Paying Living Expenses: Very hard  Food Insecurity: Food Insecurity Present (04/12/2023)   Hunger Vital Sign    Worried About Running Out of Food in the Last Year: Often true    Ran Out of Food in the Last Year: Often true  Transportation Needs: No Transportation Needs (04/12/2023)   PRAPARE - Administrator, Civil Service (Medical): No    Lack of Transportation (Non-Medical): No  Physical Activity: Unknown (04/12/2023)   Exercise Vital Sign    Days of Exercise per Week: 0 days    Minutes of Exercise per Session: Not on file  Stress: Stress Concern Present (04/12/2023)   [employer] of Occupational Health - Occupational Stress Questionnaire    Feeling of Stress : Very much  Social Connections: Moderately Isolated (04/12/2023)   Social Connection  and Isolation Panel [NHANES]    Frequency of Communication with Friends and Family: More than three times a week    Frequency of Social Gatherings with Friends and Family: Once a week    Attends Religious Services: 1 to 4 times per year    Active Member of [employer] or Organizations: No    Attends Engineer, structural: Not on file    Marital Status: Divorced    Code Status: Full code  Review of Systems: A 12 point ROS discussed and pertinent positives are indicated in the HPI above.  All other systems are negative.  Review of Systems  Constitutional:  Negative for chills and fever.  Respiratory:  Negative for chest tightness and shortness of breath.   Cardiovascular:  Negative for chest pain and leg swelling.  Gastrointestinal:  Positive for abdominal pain. Negative for diarrhea, nausea and vomiting.  Neurological:  Negative for dizziness and headaches.  Psychiatric/Behavioral:  Negative for confusion.     Vital Signs: BP (!) 146/95   Pulse 84   Temp 98 F (36.7 C) (Oral)   Resp 18     Physical Exam Vitals reviewed.  Constitutional:      General: She is not in acute distress. Cardiovascular:     Rate and Rhythm: Normal rate and regular rhythm.     Pulses: Normal pulses.     Heart sounds: Normal heart sounds.  Pulmonary:     Effort: Pulmonary effort is normal.     Breath sounds: Normal breath sounds.  Abdominal:     Palpations: Abdomen is soft.     Tenderness: There is no abdominal tenderness.  Musculoskeletal:     Right lower leg: No edema.     Left lower leg: No edema.  Skin:    General: Skin is warm and dry.  Neurological:     Mental Status: She is alert and oriented to person, place, and time.  Psychiatric:        Mood and Affect: Mood normal.        Behavior: Behavior normal.        Thought Content: Thought content normal.        Judgment: Judgment normal.     Imaging: No results found.  Labs:  CBC: Recent Labs    12/11/22 1504  04/16/23 1458 06/04/23 1411 08/15/23 1139  WBC 9.2 9.3 10.3 11.7*  HGB 15.4 16.3* 15.7 14.9  HCT 43.8 48.8* 45.1 44.9  PLT 168 200 208 212    COAGS: Recent Labs    06/04/23 1411 08/15/23 1139  INR 1.0 1.1    BMP: Recent Labs    12/11/22 1504 04/16/23 1458 06/04/23 1411  NA 141 138 141  K 4.6 4.5 4.6  CL 102 100 102  CO2 26 23 24   GLUCOSE 91 74 104*  BUN 13 23 20   CALCIUM 9.4 9.6 9.9  CREATININE 0.73 0.94 0.96    LIVER FUNCTION TESTS: Recent Labs    12/11/22 1504 04/16/23 1458 06/04/23 1411 08/02/23 1106  BILITOT 0.3 0.3 0.3  --   AST 35 30 30  --   ALT 28 26 25   --   ALKPHOS 162* 156* 142* 130*  PROT 7.1 7.7 7.7  --   ALBUMIN 3.7* 4.3 4.3  --     TUMOR MARKERS: No results for input(s): "AFPTM", "CEA", "CA199", "CHROMGRNA" in the last 8760 hours.  Assessment and Plan:  Angel [name] is a 67 yo female being seen today in relation to cirrhosis of the liver. Patient is being followed by Dr Tomasa Rand from [facility]. Dr Tomasa Rand has requested image-guided liver biopsy to further evaluate patient for autoimmune cirrhosis. Case has been reviewed with Dr Lowella Dandy and is scheduled for 08/15/23. INR of 1.1 and Hgb of 14.9. Patient is NPO and presents today in her usual state of health.  Risks and benefits of image-guided liver biopsy was discussed with the patient and/or patient's family including, but not limited to bleeding, infection, damage to adjacent structures or low yield requiring additional tests.  All of the questions were answered and there is agreement to proceed.  Consent signed and in chart.  Thank you for this interesting consult.  I greatly enjoyed meeting Angel [name] and look forward to participating in their care.  A copy of this report was sent to the requesting provider on this date.  Electronically Signed: Kennieth Francois, PA-C 08/15/2023, 12:42 PM   I spent a total of  15 Minutes in face to face in clinical consultation, greater than  50% of which was counseling/coordinating care for cirrhosis.

## 2023-08-16 LAB — SURGICAL PATHOLOGY

## 2023-08-21 ENCOUNTER — Other Ambulatory Visit: Payer: Self-pay

## 2023-08-21 DIAGNOSIS — K754 Autoimmune hepatitis: Secondary | ICD-10-CM

## 2023-08-21 NOTE — Progress Notes (Signed)
Angel [name],  Your liver biopsy showed findings consistent with early cirrhosis.  It did not show any definitive features of autoimmune hepatitis.  Given the unclear diagnosis, I think it would be reasonable to wean off the steroids and monitor your liver enzymes more closely.  If your liver enzymes start to increase off the steroids, then we may need to consider continuing treatment for autoimmune hepatitis.  If they remain low, then I think we withhold steroids and spare you the potential side effects.  In any case, it remains extremely important that you continue to avoid all alcohol completely.  I recommend you start taking your prednisone every other day.  Let's repeat your liver enzymes in one month and then go from there.  [name], can you please order a CMP and GGT in one month?

## 2023-08-23 ENCOUNTER — Ambulatory Visit: Payer: Medicare Other | Attending: Nurse Practitioner

## 2023-08-23 DIAGNOSIS — Z23 Encounter for immunization: Secondary | ICD-10-CM

## 2023-08-23 NOTE — Progress Notes (Signed)
Shingles vaccine administered per protocols.  Information sheet given. Patient denies and pain or discomfort at injection site. Tolerated injection well no reaction.

## 2023-08-25 ENCOUNTER — Emergency Department (HOSPITAL_COMMUNITY)
Admission: EM | Admit: 2023-08-25 | Discharge: 2023-08-25 | Disposition: A | Discharge status: Home / Self Care | Payer: Medicare Other | Attending: Emergency Medicine | Admitting: Emergency Medicine

## 2023-08-25 ENCOUNTER — Emergency Department (HOSPITAL_COMMUNITY): Payer: Medicare Other

## 2023-08-25 ENCOUNTER — Encounter (HOSPITAL_COMMUNITY): Payer: Self-pay

## 2023-08-25 DIAGNOSIS — R0602 Shortness of breath: Secondary | ICD-10-CM | POA: Diagnosis present

## 2023-08-25 DIAGNOSIS — R0781 Pleurodynia: Secondary | ICD-10-CM | POA: Diagnosis not present

## 2023-08-25 DIAGNOSIS — R091 Pleurisy: Secondary | ICD-10-CM

## 2023-08-25 DIAGNOSIS — R000 Tachycardia, unspecified: Secondary | ICD-10-CM | POA: Insufficient documentation

## 2023-08-25 DIAGNOSIS — R1013 Epigastric pain: Secondary | ICD-10-CM | POA: Diagnosis not present

## 2023-08-25 DIAGNOSIS — Z79899 Other long term (current) drug therapy: Secondary | ICD-10-CM | POA: Insufficient documentation

## 2023-08-25 LAB — CBC
HCT: 47.7 % — ABNORMAL HIGH (ref 36.0–46.0)
Hemoglobin: 15.8 g/dL — ABNORMAL HIGH (ref 12.0–15.0)
MCH: 30.3 pg (ref 26.0–34.0)
MCHC: 33.1 g/dL (ref 30.0–36.0)
MCV: 91.4 fL (ref 80.0–100.0)
Platelets: 190 10*3/uL (ref 150–400)
RBC: 5.22 MIL/uL — ABNORMAL HIGH (ref 3.87–5.11)
RDW: 13.2 % (ref 11.5–15.5)
WBC: 13.6 10*3/uL — ABNORMAL HIGH (ref 4.0–10.5)
nRBC: 0 % (ref 0.0–0.2)

## 2023-08-25 LAB — BASIC METABOLIC PANEL
Anion gap: 12 (ref 5–15)
BUN: 20 mg/dL (ref 8–23)
CO2: 24 mmol/L (ref 22–32)
Calcium: 9.8 mg/dL (ref 8.9–10.3)
Chloride: 101 mmol/L (ref 98–111)
Creatinine, Ser: 0.99 mg/dL (ref 0.44–1.00)
GFR, Estimated: >60 mL/min (ref >60)
Glucose, Bld: 127 mg/dL — ABNORMAL HIGH (ref 70–99)
Potassium: 4.1 mmol/L (ref 3.5–5.1)
Sodium: 137 mmol/L (ref 135–145)

## 2023-08-25 LAB — HEPATIC FUNCTION PANEL
ALT: 25 U/L (ref 0–44)
AST: 24 U/L (ref 15–41)
Albumin: 3.8 g/dL (ref 3.5–5.0)
Alkaline Phosphatase: 111 U/L (ref 38–126)
Bilirubin, Direct: 0.1 mg/dL (ref 0.0–0.2)
Indirect Bilirubin: 0.6 mg/dL (ref 0.3–0.9)
Total Bilirubin: 0.7 mg/dL (ref <1.2)
Total Protein: 8.3 g/dL — ABNORMAL HIGH (ref 6.5–8.1)

## 2023-08-25 LAB — TROPONIN I (HIGH SENSITIVITY)
Troponin I (High Sensitivity): 3 ng/L (ref <18)
Troponin I (High Sensitivity): 4 ng/L (ref <18)

## 2023-08-25 LAB — LIPASE, BLOOD: Lipase: 46 U/L (ref 11–51)

## 2023-08-25 MED ORDER — ONDANSETRON HCL 4 MG/2ML IJ SOLN
4.0000 mg | Freq: Once | INTRAMUSCULAR | Status: AC
  Administered 2023-08-25: 4 mg via INTRAVENOUS
  Filled 2023-08-25: qty 2

## 2023-08-25 MED ORDER — IOHEXOL 350 MG/ML SOLN
100.0000 mL | Freq: Once | INTRAVENOUS | Status: AC | PRN
  Administered 2023-08-25: 100 mL via INTRAVENOUS

## 2023-08-25 MED ORDER — MORPHINE SULFATE (PF) 4 MG/ML IV SOLN
4.0000 mg | Freq: Once | INTRAVENOUS | Status: AC
Start: 2023-08-25 — End: 2023-08-25
  Administered 2023-08-25: 4 mg via INTRAVENOUS
  Filled 2023-08-25: qty 1

## 2023-08-25 NOTE — ED Triage Notes (Addendum)
Pt c/o central chest/pain under L breast and SOB x2 days.  Pain score 10/10.  Pt has not taken anything for symptoms.  Pain increases w/ deep breathing.  Pt reports receiving her 2nd shingles vaccine x2 days ago.     Pt reports she had several medications decreased the beginning of last week including prednisone and Lasix.   Pt reports liver biopsy on 11/10.

## 2023-08-25 NOTE — ED Provider Notes (Signed)
[place] EMERGENCY DEPARTMENT AT [facility] Provider Note   CSN: [id number] Arrival date & time: 08/25/23  1149     History  Chief Complaint  Patient presents with   Chest Pain   Shortness of Breath    Angel [name] is a 67 y.o. female.  67 year old female with prior medical history as detailed below presents for evaluation.  Patient complains of epigastric and left-sided chest and upper abdominal discomfort x 2 days.  Patient's pain is worse with deep breathing.  Patient denies fever.  Patient denies cough.  She reports mild increased shortness of breath secondary to the pain.  The history is provided by the patient and medical records.       Home Medications Prior to Admission medications   Medication Sig Start Date End Date Taking? Authorizing Provider  albuterol (VENTOLIN HFA) 108 (90 Base) MCG/ACT inhaler Inhale 2 puffs into the lungs every 6 (six) hours as needed for wheezing or shortness of breath. 12/11/22   Storm Frisk, MD  budesonide-formoterol (SYMBICORT) 80-4.5 MCG/ACT inhaler Inhale 2 puffs into the lungs 2 (two) times daily. 04/16/23   Storm Frisk, MD  folic acid (FOLVITE) 1 MG tablet Take 1 tablet (1 mg total) by mouth daily. 07/24/23   Hoy Register, MD  furosemide (LASIX) 20 MG tablet Take 1 tablet (20 mg total) by mouth daily. For one week then one daily 08/02/23   Jenel Lucks, MD  hydrOXYzine (ATARAX) 25 MG tablet Take 1 tablet (25 mg total) by mouth daily as needed for itching. 07/24/23   Hoy Register, MD  ipratropium-albuterol (DUONEB) 0.5-2.5 (3) MG/3ML SOLN Take 3 mLs by nebulization every 6 (six) hours as needed. 09/18/22   Storm Frisk, MD  lactulose (CHRONULAC) 10 GM/15ML solution Take 15 mLs (10 g total) by mouth 2 (two) times daily. 08/02/23   Jenel Lucks, MD  methocarbamol (ROBAXIN) 500 MG tablet Take 1 tablet (500 mg total) by mouth every 6 (six) hours as needed for muscle spasms. 04/16/23   Storm Frisk, MD  Multiple Vitamins-Minerals (CENTRUM SILVER PO) Take 1 tablet by mouth daily.    [provider]  omeprazole (PRILOSEC) 20 MG capsule Take 1 capsule (20 mg total) by mouth daily. 04/16/23   Storm Frisk, MD  OVER THE COUNTER MEDICATION Apply 1 application  topically 2 (two) times daily as needed (joint pain). Tiger Balm PO    [provider]  OVER THE COUNTER MEDICATION Apply 1 Application topically 2 (two) times daily as needed (joint pain). Frankincense oil    [provider]  predniSONE (DELTASONE) 5 MG tablet Take 1 tablet (5 mg total) by mouth daily. 04/16/23   Storm Frisk, MD  spironolactone (ALDACTONE) 50 MG tablet Take 1 tablet (50 mg total) by mouth daily. 08/02/23   Jenel Lucks, MD  thiamine (VITAMIN B1) 100 MG tablet Take 1 tablet (100 mg total) by mouth daily. 04/24/23   Storm Frisk, MD  Trolamine Salicylate (ASPERCREME EX) Apply 1 Application topically See admin instructions. 1 application 3-4 times a day as needed for joint pain    [provider]  Vitamin D, Ergocalciferol, (DRISDOL) 1.25 MG (50000 UNIT) CAPS capsule Take 1 capsule (50,000 Units total) by mouth every 7 (seven) days. 04/16/23   Storm Frisk, MD      Allergies    Penicillins    Review of Systems   Review of Systems  Physical Exam Updated Vital  Signs BP 139/86   Pulse (!) 105   Temp 98.4 F (36.9 C)   Resp 18   Ht 5\' 7"  (1.702 m)   Wt 74.4 kg   SpO2 96%   BMI 25.69 kg/m  Physical Exam Vitals and nursing note reviewed.  Constitutional:      General: She is not in acute distress.    Appearance: Normal appearance. She is well-developed.  HENT:     Head: Normocephalic and atraumatic.  Eyes:     Conjunctiva/sclera: Conjunctivae normal.     Pupils: Pupils are equal, round, and reactive to light.  Cardiovascular:     Rate and Rhythm: Normal rate and regular rhythm.     Heart sounds: Normal heart sounds.  Pulmonary:     Effort:  Pulmonary effort is normal. No respiratory distress.     Breath sounds: Normal breath sounds.  Abdominal:     General: There is no distension.     Palpations: Abdomen is soft.     Tenderness: There is abdominal tenderness.     Comments: Abdominal tenderness in the epigastrium with palpation  Musculoskeletal:        General: No deformity. Normal range of motion.     Cervical back: Normal range of motion and neck supple.  Skin:    General: Skin is warm and dry.  Neurological:     General: No focal deficit present.     Mental Status: She is alert and oriented to person, place, and time.     ED Results / Procedures / Treatments   Labs (all labs ordered are listed, but only abnormal results are displayed) Labs Reviewed  BASIC METABOLIC PANEL  CBC  HEPATIC FUNCTION PANEL  LIPASE, BLOOD  TROPONIN I (HIGH SENSITIVITY)    EKG EKG Interpretation Date/Time:  Sunday August 25 2023 11:55:24 EST Ventricular Rate:  107 PR Interval:  139 QRS Duration:  100 QT Interval:  327 QTC Calculation: 437 R Axis:   83  Text Interpretation: Sinus tachycardia Borderline right axis deviation Confirmed by Kristine Royal [phone]) on 08/25/2023 12:27:49 PM  Radiology No results found.  Procedures Procedures    Medications Ordered in ED Medications  morphine (PF) 4 MG/ML injection 4 mg (has no administration in time range)  ondansetron (ZOFRAN) injection 4 mg (has no administration in time range)    ED Course/ Medical Decision Making/ A&P                                 Medical Decision Making Amount and/or Complexity of Data Reviewed Labs: ordered. Radiology: ordered.  Risk Prescription drug management.    Medical Screen Complete  This patient presented to the ED with complaint of epigastric abdominal pain, left-sided pleuritic pain.  This complaint involves an extensive number of treatment options. The initial differential diagnosis includes, but is not limited to, PE,  pneumonia, muscular strain, intra-abdominal infection, etc.  This presentation is: Acute, Self-Limited, Previously Undiagnosed, Uncertain Prognosis, Complicated, Systemic Symptoms, and Threat to Life/Bodily Function  Patient presents with complaint of left-sided pleuritic chest pain with associated epigastric discomfort.  Patient does report recent coughing with possible strain of the chest wall.  Patient without significantly abnormal vitals on initial evaluation.  Initial screening labs obtained are without significant abnormality other than elevated white count at 13.6.  Patient's EKG is without evidence of acute ischemia.  Troponin x 2 is without significant elevation or delta.  CT imaging of the chest abdomen pelvis is without evidence of acute pathology such as PE, intra-abdominal infection, etc.  Patient is feeling much improved after ED evaluation.  She was offered admission for observation and pain control.  She declined same.  She desires discharge home.  She does have a plan to follow-up closely with your regular care providers in the outpatient setting tomorrow.  Patient advised that if her pain returns or if she has fever, nausea, shortness of breath she should return immediately to the ED for evaluation. Additional history obtained:  External records from outside sources obtained and reviewed including prior ED visits and prior Inpatient records.    Lab Tests:  I ordered and personally interpreted labs.  The pertinent results include: CBC, BMP, hepatic function panel, lipase, troponin x 2   Imaging Studies ordered:  I ordered imaging studies including chest x-ray, CTA chest, CT abdomen pelvis I independently visualized and interpreted obtained imaging which showed NAD I agree with the radiologist interpretation.   Cardiac Monitoring:  The patient was maintained on a cardiac monitor.  I personally viewed and interpreted the cardiac monitor which showed an  underlying rhythm of: Sinus tach then NSR   Medicines ordered:  I ordered medication including morphine for pain Reevaluation of the patient after these medicines showed that the patient: improved   Problem List / ED Course:  Pleurisy   Reevaluation:  After the interventions noted above, I reevaluated the patient and found that they have: improved   Disposition:  After consideration of the diagnostic results and the patients response to treatment, I feel that the patent would benefit from close outpatient follow-up.          Final Clinical Impression(s) / ED Diagnoses Final diagnoses:  Pleurisy    Rx / DC Orders ED Discharge Orders     None         Wynetta Fines, MD 08/25/23 1517

## 2023-08-25 NOTE — Discharge Instructions (Signed)
Return for any problem.  ?

## 2023-08-28 ENCOUNTER — Other Ambulatory Visit: Payer: Self-pay

## 2023-08-30 ENCOUNTER — Other Ambulatory Visit: Payer: Self-pay

## 2023-09-25 ENCOUNTER — Other Ambulatory Visit: Payer: Self-pay

## 2023-09-25 ENCOUNTER — Other Ambulatory Visit: Payer: Self-pay | Admitting: Critical Care Medicine

## 2023-09-25 MED ORDER — METHOCARBAMOL 500 MG PO TABS
500.0000 mg | ORAL_TABLET | Freq: Four times a day (QID) | ORAL | 0 refills | Status: DC | PRN
  Filled 2023-09-25: qty 60, 15d supply, fill #0

## 2023-09-26 ENCOUNTER — Other Ambulatory Visit: Payer: Self-pay

## 2023-09-27 ENCOUNTER — Other Ambulatory Visit (INDEPENDENT_AMBULATORY_CARE_PROVIDER_SITE_OTHER): Payer: Medicare Other

## 2023-09-27 ENCOUNTER — Encounter: Payer: Self-pay | Admitting: Gastroenterology

## 2023-09-27 ENCOUNTER — Ambulatory Visit: Payer: Medicare Other | Admitting: Gastroenterology

## 2023-09-27 ENCOUNTER — Other Ambulatory Visit: Payer: Self-pay

## 2023-09-27 VITALS — BP 112/70 | HR 88 | Ht 67.0 in | Wt 165.0 lb

## 2023-09-27 DIAGNOSIS — K7031 Alcoholic cirrhosis of liver with ascites: Secondary | ICD-10-CM

## 2023-09-27 DIAGNOSIS — J449 Chronic obstructive pulmonary disease, unspecified: Secondary | ICD-10-CM

## 2023-09-27 DIAGNOSIS — Z800 Family history of malignant neoplasm of digestive organs: Secondary | ICD-10-CM

## 2023-09-27 DIAGNOSIS — L299 Pruritus, unspecified: Secondary | ICD-10-CM | POA: Diagnosis not present

## 2023-09-27 DIAGNOSIS — K754 Autoimmune hepatitis: Secondary | ICD-10-CM

## 2023-09-27 DIAGNOSIS — G4762 Sleep related leg cramps: Secondary | ICD-10-CM

## 2023-09-27 LAB — COMPREHENSIVE METABOLIC PANEL
ALT: 24 U/L (ref 0–35)
AST: 34 U/L (ref 0–37)
Albumin: 4.5 g/dL (ref 3.5–5.2)
Alkaline Phosphatase: 108 U/L (ref 39–117)
BUN: 21 mg/dL (ref 6–23)
CO2: 29 meq/L (ref 19–32)
Calcium: 10 mg/dL (ref 8.4–10.5)
Chloride: 99 meq/L (ref 96–112)
Creatinine, Ser: 1.01 mg/dL (ref 0.40–1.20)
GFR: 58.58 mL/min — ABNORMAL LOW (ref >60.00)
Glucose, Bld: 81 mg/dL (ref 70–99)
Potassium: 4.2 meq/L (ref 3.5–5.1)
Sodium: 138 meq/L (ref 135–145)
Total Bilirubin: 0.8 mg/dL (ref 0.2–1.2)
Total Protein: 8.3 g/dL (ref 6.0–8.3)

## 2023-09-27 LAB — BASIC METABOLIC PANEL
BUN: 21 mg/dL (ref 6–23)
CO2: 29 meq/L (ref 19–32)
Calcium: 10 mg/dL (ref 8.4–10.5)
Chloride: 99 meq/L (ref 96–112)
Creatinine, Ser: 1.01 mg/dL (ref 0.40–1.20)
GFR: 58.58 mL/min — ABNORMAL LOW (ref >60.00)
Glucose, Bld: 81 mg/dL (ref 70–99)
Potassium: 4.2 meq/L (ref 3.5–5.1)
Sodium: 138 meq/L (ref 135–145)

## 2023-09-27 LAB — HEPATIC FUNCTION PANEL
ALT: 24 U/L (ref 0–35)
AST: 34 U/L (ref 0–37)
Albumin: 4.5 g/dL (ref 3.5–5.2)
Alkaline Phosphatase: 108 U/L (ref 39–117)
Bilirubin, Direct: 0.2 mg/dL (ref 0.0–0.3)
Total Bilirubin: 0.8 mg/dL (ref 0.2–1.2)
Total Protein: 8.3 g/dL (ref 6.0–8.3)

## 2023-09-27 LAB — GAMMA GT: GGT: 41 U/L (ref 7–51)

## 2023-09-27 MED ORDER — BACLOFEN 10 MG PO TABS
10.0000 mg | ORAL_TABLET | Freq: Every day | ORAL | 1 refills | Status: DC
  Filled 2023-09-27: qty 30, 30d supply, fill #0
  Filled 2023-10-25: qty 30, 30d supply, fill #1
  Filled 2023-11-22: qty 30, 30d supply, fill #2

## 2023-09-27 NOTE — Progress Notes (Unsigned)
HPI : Angel [name] is a 67 y.o. 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  She was initially seen by me in October this year.  Please see that note for a description of her initial presentation of cirrhosis and initial therapy. At her last visit, she was recommended to undergo a liver biopsy to help identify any evidence of active autoimmune hepatitis to guide further therapy.  She was also deemed to be due for an EGD and colonoscopy, but she had significant wheezing on exam, so it is recommended that we defer these procedures until her pulmonary function status is improved.  Leg cramps, itching   Prednisone every other day since October.     CT Abdomen/Pelvis with contrast Aug 25, 2023 IMPRESSION: 1. Lobulated decreased in size liver, usually associated with cirrhosis. 2. Small amount of peridiaphragmatic ascites. 3. Cholelithiasis without evidence of acute cholecystitis. 4. Aortic atherosclerosis.    Liver biopsy August 15, 2023 FINAL MICROSCOPIC DIAGNOSIS:  A. LIVER, LEFT LOBE, BIOPSY: -  Liver with thin septal bridging fibrosis and nodule formation consistent with early cirrhosis.  Note: The liver parenchyma shows thin bridging septa (best seen on trichrome) with formation of hepatic nodules.  The bridging fibrosis appears predominantly portal-to-portal however there is some central vein fibrosis.  Focally there is a mild amount of PERI sinusoidal/pericellular fibrosis however this is not the prominent overall pattern.  The portal tracts show     chronic inflammatory cells which are predominantly lymphocytes with only rare plasma cells and a few scattered eosinophils.  There is some evidence of bile ductular proliferation consistent with cirrhosis.  There is focal ballooning degeneration and rare Mallory's hyalin; however these are not prominent features.  There is  essentially no steatosis present.  A definitive morphologic etiology for the patient's cirrhosis is not identified. There is no evidence of ongoing steatohepatitis, given the lack of steatosis (history of alcohol use noted).  In addition while there is a chronic inflammatory infiltrate in the portal tracts plasma cells are not a prominent component.  There is ballooning degeneration however this is nonspecific.  There are scattered eosinophils present which could raise the possibility of toxic/drug etiologies.  There is no significant Kupffer or hepatocellular iron deposition.  PAS and reticulin stains were also reviewed.  The overall stage is stage III-IV of 4 consistent with early cirrhosis.  Clinical/serologic correlation necessary.    Component Ref Range & Units (hover) 1 mo ago  Actin (Smooth Muscle) Antibody (IGG) 35 High   Comment: . Reference Range:    <20 U: Negative >or=20 U: Positive . Marland Kitchen Antibodies recognizing actin are the main component of smooth muscle antibodies associated with auto- immune liver disease. Actin antibodies are found in approximately 75% of patients with autoimmune hepatitis (AIH) type 1, approximately 65% of patients with autoimmune cholangitis, approximately 30% of patients with primary biliary cirrhosis and approximately 2% of healthy controls. High values are closely correlated with AIH type 1.   Component Ref Range & Units (hover) 1 mo ago  IgG (Immunoglobin G), Serum 1,339   Component Ref Range & Units (hover) 1 mo ago (08/02/23) 3 mo ago (06/04/23) 5 mo ago (04/16/23) 9 mo ago (12/11/22) 1 yr ago (08/06/22) 1 yr ago (04/19/22) 1 yr ago (04/13/22)  Alkaline Phosphatase 130 High  142 High  156 High  162 High  101 R 202 High  153 High  R  LIVER FRACTION  45        BONE FRACTION 35        INTESTINAL FRAC. 20 High          Component Ref Range & Units (hover) 1 mo ago  GGT 52 High             Component Ref Range & Units (hover) 1 mo  ago (08/25/23) 1 mo ago (08/02/23) 3 mo ago (06/04/23) 5 mo ago (04/16/23) 9 mo ago (12/11/22) 1 yr ago (08/06/22) 1 yr ago (04/19/22)  Total Protein 8.3 High   7.7 R 7.7 R 7.1 R 8.4 High  8.8 High  R  Albumin 3.8  4.3 R 4.3 R 3.7 Low  R 3.3 Low  2.9 Low  R, CM  AST 24  30 R 30 R 35 R 58 High  365 High  R  ALT 25  25 R 26 R 28 R 36 241 High  R  Alkaline Phosphatase 111 130 High  R 142 High  R 156 High  R 162 High  R 101 202 High  R  Total Bilirubin 0.7  0.3 R 0.3 R 0.3 R 0.9 R 1.5 High  R  Bilirubin, Direct 0.1        Indirect Bilirubin 0.6           Past Medical History:  Diagnosis Date   Alcohol use 04/19/2022   Alcoholic cirrhosis (HCC)    Asthma    Bronchiolitis    Cirrhosis (HCC)    COPD (chronic obstructive pulmonary disease) (HCC)    Hypertension    Smoker 12/31/2013     Past Surgical History:  Procedure Laterality Date   ABDOMINAL HYSTERECTOMY     APPENDECTOMY     BACK SURGERY     Family History  Problem Relation Age of Onset   Breast cancer Mother    Colon cancer Mother    CAD Father    Heart disease Father    Breast cancer Sister    Emphysema Paternal Grandfather        never smoker, Theatre stage manager   Liver disease Neg Hx    Esophageal cancer Neg Hx    Social History   Tobacco Use   Smoking status: Former    Current packs/day: 0.00    Average packs/day: 0.5 packs/day for 30.0 years (15.0 ttl pk-yrs)    Types: Cigarettes    Start date: 12/31/1983    Quit date: 12/30/2013    Years since quitting: 9.7   Smokeless tobacco: Never  Vaping Use   Vaping status: Never Used  Substance Use Topics   Alcohol use: Yes    Alcohol/week: 28.0 standard drinks of alcohol    Types: 28 Glasses of wine per week   Drug use: Yes    Frequency: 3.0 times per week    Types: Marijuana   Current Outpatient Medications  Medication Sig Dispense Refill   albuterol (VENTOLIN HFA) 108 (90 Base) MCG/ACT inhaler Inhale 2 puffs into the lungs every 6 (six) hours as needed for  wheezing or shortness of breath. 54 g 1   budesonide-formoterol (SYMBICORT) 80-4.5 MCG/ACT inhaler Inhale 2 puffs into the lungs 2 (two) times daily. 10.2 g 3   folic acid (FOLVITE) 1 MG tablet Take 1 tablet (1 mg total) by mouth daily. 90 tablet 0   furosemide (LASIX) 20 MG tablet Take 1 tablet (20 mg total) by mouth daily. For one week then one daily 30 tablet 3   hydrOXYzine (ATARAX)  25 MG tablet Take 1 tablet (25 mg total) by mouth daily as needed for itching. 90 tablet 0   ipratropium-albuterol (DUONEB) 0.5-2.5 (3) MG/3ML SOLN Take 3 mLs by nebulization every 6 (six) hours as needed. 360 mL 0   lactulose (CHRONULAC) 10 GM/15ML solution Take 15 mLs (10 g total) by mouth 2 (two) times daily. 900 mL 2   methocarbamol (ROBAXIN) 500 MG tablet Take 1 tablet (500 mg total) by mouth every 6 (six) hours as needed for muscle spasms. 60 tablet 0   Multiple Vitamins-Minerals (CENTRUM SILVER PO) Take 1 tablet by mouth daily.     omeprazole (PRILOSEC) 20 MG capsule Take 1 capsule (20 mg total) by mouth daily. 60 capsule 2   OVER THE COUNTER MEDICATION Apply 1 application  topically 2 (two) times daily as needed (joint pain). Tiger Balm PO     OVER THE COUNTER MEDICATION Apply 1 Application topically 2 (two) times daily as needed (joint pain). Frankincense oil     predniSONE (DELTASONE) 5 MG tablet Take 1 tablet (5 mg total) by mouth daily. 90 tablet 1   spironolactone (ALDACTONE) 50 MG tablet Take 1 tablet (50 mg total) by mouth daily. 30 tablet 2   thiamine (VITAMIN B1) 100 MG tablet Take 1 tablet (100 mg total) by mouth daily. 100 tablet 0   Trolamine Salicylate (ASPERCREME EX) Apply 1 Application topically See admin instructions. 1 application 3-4 times a day as needed for joint pain     Vitamin D, Ergocalciferol, (DRISDOL) 1.25 MG (50000 UNIT) CAPS capsule Take 1 capsule (50,000 Units total) by mouth every 7 (seven) days. 12 capsule 5   No current facility-administered medications for this visit.    Allergies  Allergen Reactions   Penicillins Hives     Review of Systems: All systems reviewed and negative except where noted in HPI.    No results found.  Physical Exam: BP 112/70   Pulse 88   Ht 5\' 7"  (1.702 m)   Wt 165 lb (74.8 kg)   BMI 25.84 kg/m  Constitutional: Pleasant,well-developed, Caucasian female in no acute distress. HEENT: Normocephalic and atraumatic. Conjunctivae are normal. No scleral icterus. Neck supple.  Cardiovascular: Normal rate, regular rhythm.  Pulmonary/chest: Effort normal and breath sounds normal. No wheezing, rales or rhonchi. Abdominal: Soft, nondistended, nontender. Bowel sounds active throughout. There are no masses palpable. No hepatomegaly. Extremities: no edema Lymphadenopathy: No cervical adenopathy noted. Neurological: Alert and oriented to person place and time. Skin: Skin is warm and dry. No rashes noted. Psychiatric: Normal mood and affect. Behavior is normal.  CBC    Component Value Date/Time   WBC 13.6 (H) 08/25/2023 1214   RBC 5.22 (H) 08/25/2023 1214   HGB 15.8 (H) 08/25/2023 1214   HGB 15.7 06/04/2023 1411   HCT 47.7 (H) 08/25/2023 1214   HCT 45.1 06/04/2023 1411   PLT 190 08/25/2023 1214   PLT 208 06/04/2023 1411   MCV 91.4 08/25/2023 1214   MCV 87 06/04/2023 1411   MCH 30.3 08/25/2023 1214   MCHC 33.1 08/25/2023 1214   RDW 13.2 08/25/2023 1214   RDW 13.2 06/04/2023 1411   LYMPHSABS 2.5 06/04/2023 1411   MONOABS 0.7 08/06/2022 1735   EOSABS 0.1 06/04/2023 1411   BASOSABS 0.1 06/04/2023 1411    CMP     Component Value Date/Time   NA 137 08/25/2023 1214   NA 141 06/04/2023 1411   K 4.1 08/25/2023 1214   CL 101 08/25/2023 1214   CO2 24  08/25/2023 1214   GLUCOSE 127 (H) 08/25/2023 1214   BUN 20 08/25/2023 1214   BUN 20 06/04/2023 1411   CREATININE 0.99 08/25/2023 1214   CREATININE 0.79 02/19/2014 1226   CALCIUM 9.8 08/25/2023 1214   PROT 8.3 (H) 08/25/2023 1214   PROT 7.7 06/04/2023 1411   ALBUMIN 3.8  08/25/2023 1214   ALBUMIN 4.3 06/04/2023 1411   AST 24 08/25/2023 1214   ALT 25 08/25/2023 1214   ALKPHOS 111 08/25/2023 1214   BILITOT 0.7 08/25/2023 1214   BILITOT 0.3 06/04/2023 1411   GFRNONAA >60 08/25/2023 1214   GFRNONAA 85 02/19/2014 1226   GFRAA >60 04/17/2019 0514   GFRAA >89 02/19/2014 1226       Latest Ref Rng & Units 08/25/2023   12:14 PM 08/15/2023   11:39 AM 06/04/2023    2:11 PM  CBC EXTENDED  WBC 4.0 - 10.5 K/uL 13.6  11.7  10.3   RBC 3.87 - 5.11 MIL/uL 5.22  4.95  5.17   Hemoglobin 12.0 - 15.0 g/dL 35.5  97.4  16.3   HCT 36.0 - 46.0 % 47.7  44.9  45.1   Platelets 150 - 400 K/uL 190  212  208   NEUT# 1.4 - 7.0 x10E3/uL   6.4   Lymph# 0.7 - 3.1 x10E3/uL   2.5       ASSESSMENT AND PLAN:  Storm Frisk, MD

## 2023-09-27 NOTE — Patient Instructions (Addendum)
We have sent a referral to [facility] and they will contact you to make an appointment.  Your provider has requested that you go to the basement level for lab work before leaving today. Press "B" on the elevator. The lab is located at the first door on the left as you exit the elevator.  _______________________________________________________  If your blood pressure at your visit was 140/90 or greater, please contact your primary care physician to follow up on this.  _______________________________________________________  If you are age 66 or older, your body mass index should be between 23-30. Your Body mass index is 25.84 kg/m. If this is out of the aforementioned range listed, please consider follow up with your Primary Care Provider.  If you are age 6 or younger, your body mass index should be between 19-25. Your Body mass index is 25.84 kg/m. If this is out of the aformentioned range listed, please consider follow up with your Primary Care Provider.   ________________________________________________________  The [facility] providers would like to encourage you to use [facility] to communicate with providers for non-urgent requests or questions.  Due to long hold times on the telephone, sending your provider a message by [facility] may be a faster and more efficient way to get a response.  Please allow 48 business hours for a response.  Please remember that this is for non-urgent requests.   It was a pleasure to see you today!  Thank you for trusting me with your gastrointestinal care!    Scott E.Tomasa Rand, MD

## 2023-10-04 LAB — IGG: IgG (Immunoglobin G), Serum: 1314 mg/dL (ref 600–1540)

## 2023-10-04 LAB — ANTI-SMOOTH MUSCLE ANTIBODY, IGG: Actin (Smooth Muscle) Antibody (IGG): 38 U — ABNORMAL HIGH (ref <20)

## 2023-10-08 ENCOUNTER — Other Ambulatory Visit: Payer: Self-pay

## 2023-10-10 ENCOUNTER — Other Ambulatory Visit: Payer: Self-pay

## 2023-10-14 ENCOUNTER — Other Ambulatory Visit: Payer: Self-pay

## 2023-10-14 ENCOUNTER — Encounter: Payer: Self-pay | Admitting: Internal Medicine

## 2023-10-14 ENCOUNTER — Ambulatory Visit: Payer: 59 | Attending: Internal Medicine | Admitting: Internal Medicine

## 2023-10-14 VITALS — BP 130/78 | HR 93 | Wt 164.2 lb

## 2023-10-14 DIAGNOSIS — I10 Essential (primary) hypertension: Secondary | ICD-10-CM | POA: Diagnosis not present

## 2023-10-14 DIAGNOSIS — F322 Major depressive disorder, single episode, severe without psychotic features: Secondary | ICD-10-CM | POA: Insufficient documentation

## 2023-10-14 DIAGNOSIS — F17210 Nicotine dependence, cigarettes, uncomplicated: Secondary | ICD-10-CM

## 2023-10-14 DIAGNOSIS — G629 Polyneuropathy, unspecified: Secondary | ICD-10-CM

## 2023-10-14 DIAGNOSIS — K7030 Alcoholic cirrhosis of liver without ascites: Secondary | ICD-10-CM | POA: Diagnosis not present

## 2023-10-14 DIAGNOSIS — F17200 Nicotine dependence, unspecified, uncomplicated: Secondary | ICD-10-CM

## 2023-10-14 DIAGNOSIS — J449 Chronic obstructive pulmonary disease, unspecified: Secondary | ICD-10-CM

## 2023-10-14 MED ORDER — GABAPENTIN 100 MG PO CAPS
100.0000 mg | ORAL_CAPSULE | Freq: Two times a day (BID) | ORAL | 1 refills | Status: DC
  Filled 2023-10-14: qty 60, 30d supply, fill #0
  Filled 2023-11-12: qty 60, 30d supply, fill #1

## 2023-10-14 NOTE — Progress Notes (Signed)
 Patient ID: Angel [name], female    DOB: 1956-08-13  MRN: [id number]  CC: feet pain and Medical Management of Chronic Issues   Subjective: Angel [name] is a 68 y.o. female who presents for chronic ds management. Her concerns today include:  Patient with history of HTN, aortic atherosclerosis, tobacco dependence, COPD/bronchiectasis, EtOH cirrhosis, HL, vitamin D  deficiency, malnutrition  Discussed the use of AI scribe software for clinical note transcription with the patient, who gave verbal consent to proceed.  History of Present Illness   The patient, with a history of cirrhosis of the liver, has been under the care of a gastroenterologist. She recently had a liver biopsy, which suggested that autoimmune hepatitis is less likely. She is on prednisone , furosemide , spironolactone , and lactulose  for her liver condition.  She reports taking her medicines consistently.  She denies any swelling of the abdomen or lower extremity edema.  She has quit drinking alcohol completely and is trying to quit smoking.  -Gastroenterologist wants to do colonoscopy and EGD.  However he wants her to see pulmonary first given her COPD.  He has submitted the referral.  She has not been called as yet. Reports she has gained about 30 pounds since August of last year because she has been eating sweets a lot since quitting drinking.  She is working on doing better with her eating habits  She has a history of high blood pressure and COPD, for which she uses a nebulizer and Symbicort  once a day.  She has cut back from 1 pack a day to 5 cigarettes a day.  Working on trying to quit.  She is also experiencing new symptoms pain in both feet, described as a burning sensation that shoots up the foot, particularly in the mornings. The pain is severe and has been present for the last month.      Patient Active Problem List   Diagnosis Date Noted   Moderately severe major depression (HCC) 10/14/2023   Benign disease of  right breast 11/06/2022   Aortic atherosclerosis (HCC) 04/19/2022   Vitamin D  deficiency 04/19/2022   Iron overload 04/19/2022   Cirrhosis (HCC) 04/12/2022   Malnutrition of moderate degree 04/12/2022   HTN (hypertension) 04/11/2022   HLD (hyperlipidemia) 04/11/2022   Hypoalbuminemia 04/11/2022   Alcoholic cirrhosis of liver with ascites (HCC) 04/11/2022   Bronchiectasis without acute exacerbation (HCC) 03/06/2014   COPD (chronic obstructive pulmonary disease) (HCC) 02/19/2014   Environmental allergies 02/19/2014     Current Outpatient Medications on File Prior to Visit  Medication Sig Dispense Refill   albuterol  (VENTOLIN  HFA) 108 (90 Base) MCG/ACT inhaler Inhale 2 puffs into the lungs every 6 (six) hours as needed for wheezing or shortness of breath. 54 g 1   baclofen  (LIORESAL ) 10 MG tablet Take 1 tablet (10 mg total) by mouth at bedtime. 90 each 1   budesonide -formoterol  (SYMBICORT ) 80-4.5 MCG/ACT inhaler Inhale 2 puffs into the lungs 2 (two) times daily. 10.2 g 3   folic acid  (FOLVITE ) 1 MG tablet Take 1 tablet (1 mg total) by mouth daily. 90 tablet 0   furosemide  (LASIX ) 20 MG tablet Take 1 tablet (20 mg total) by mouth daily. For one week then one daily 30 tablet 3   hydrOXYzine  (ATARAX ) 25 MG tablet Take 1 tablet (25 mg total) by mouth daily as needed for itching. 90 tablet 0   ipratropium-albuterol  (DUONEB) 0.5-2.5 (3) MG/3ML SOLN Take 3 mLs by nebulization every 6 (six) hours as needed. 360 mL 0  lactulose  (CHRONULAC ) 10 GM/15ML solution Take 15 mLs (10 g total) by mouth 2 (two) times daily. 900 mL 2   methocarbamol  (ROBAXIN ) 500 MG tablet Take 1 tablet (500 mg total) by mouth every 6 (six) hours as needed for muscle spasms. 60 tablet 0   Multiple Vitamins-Minerals (CENTRUM SILVER PO) Take 1 tablet by mouth daily.     omeprazole  (PRILOSEC) 20 MG capsule Take 1 capsule (20 mg total) by mouth daily. 60 capsule 2   OVER THE COUNTER MEDICATION Apply 1 application  topically 2 (two)  times daily as needed (joint pain). Tiger Balm PO     OVER THE COUNTER MEDICATION Apply 1 Application topically 2 (two) times daily as needed (joint pain). Frankincense oil     predniSONE  (DELTASONE ) 5 MG tablet Take 1 tablet (5 mg total) by mouth daily. 90 tablet 1   spironolactone  (ALDACTONE ) 50 MG tablet Take 1 tablet (50 mg total) by mouth daily. 30 tablet 2   thiamine  (VITAMIN B1) 100 MG tablet Take 1 tablet (100 mg total) by mouth daily. 100 tablet 0   Trolamine Salicylate (ASPERCREME EX) Apply 1 Application topically See admin instructions. 1 application 3-4 times a day as needed for joint pain     Vitamin D , Ergocalciferol , (DRISDOL ) 1.25 MG (50000 UNIT) CAPS capsule Take 1 capsule (50,000 Units total) by mouth every 7 (seven) days. 12 capsule 5   No current facility-administered medications on file prior to visit.    Allergies  Allergen Reactions   Penicillins Hives    Social History   Socioeconomic History   Marital status: Single    Spouse name: Not on file   Number of children: 0   Years of education: Not on file   Highest education level: Associate degree: academic program  Occupational History   Occupation: Unemployed  Tobacco Use   Smoking status: Former    Current packs/day: 0.00    Average packs/day: 0.5 packs/day for 30.0 years (15.0 ttl pk-yrs)    Types: Cigarettes    Start date: 12/31/1983    Quit date: 12/30/2013    Years since quitting: 9.7   Smokeless tobacco: Never  Vaping Use   Vaping status: Never Used  Substance and Sexual Activity   Alcohol use: Yes    Alcohol/week: 28.0 standard drinks of alcohol    Types: 28 Glasses of wine per week   Drug use: Yes    Frequency: 3.0 times per week    Types: Marijuana   Sexual activity: Not Currently  Other Topics Concern   Not on file  Social History Narrative   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    Never pregnant, NO husband    Social drinkier    Quite smoking 3/15   Social Drivers of Corporate Investment Banker Strain: Low Risk  (10/14/2023)   Overall Financial Resource Strain (CARDIA)    Difficulty of Paying Living Expenses: Not very hard  Food Insecurity: No Food Insecurity (10/14/2023)   Hunger Vital Sign    Worried About Running Out of Food in the Last Year: Never true    Ran Out of Food in the Last Year: Never true  Recent Concern: Food Insecurity - Food Insecurity Present (10/12/2023)   Hunger Vital Sign    Worried About Running Out of Food in the Last Year: Often true    Ran Out of Food in the Last Year: Sometimes  true  Transportation Needs: No Transportation Needs (10/14/2023)   PRAPARE - Administrator, Civil Service (Medical): No    Lack of Transportation (Non-Medical): No  Physical Activity: Inactive (10/14/2023)   Exercise Vital Sign    Days of Exercise per Week: 0 days    Minutes of Exercise per Session: 0 min  Stress: No Stress Concern Present (10/14/2023)   [employer] of Occupational Health - Occupational Stress Questionnaire    Feeling of Stress : Not at all  Recent Concern: Stress - Stress Concern Present (10/12/2023)   [employer] of Occupational Health - Occupational Stress Questionnaire    Feeling of Stress : Very much  Social Connections: Moderately Isolated (10/14/2023)   Social Connection and Isolation Panel [NHANES]    Frequency of Communication with Friends and Family: More than three times a week    Frequency of Social Gatherings with Friends and Family: Twice a week    Attends Religious Services: Never    Database Administrator or Organizations: Yes    Attends Banker Meetings: Never    Marital Status: Divorced  Catering Manager Violence: Not on file    Family History  Problem Relation Age of Onset   Breast cancer Mother    Colon cancer Mother    CAD Father    Heart disease Father    Breast cancer Sister    Emphysema Paternal Grandfather         never smoker, theatre stage manager   Liver disease Neg Hx    Esophageal cancer Neg Hx     Past Surgical History:  Procedure Laterality Date   ABDOMINAL HYSTERECTOMY     APPENDECTOMY     BACK SURGERY      ROS: Review of Systems Negative except as stated above  PHYSICAL EXAM: BP 130/78   Pulse 93   Wt 164 lb 3.2 oz (74.5 kg)   SpO2 96%   BMI 25.72 kg/m   Wt Readings from Last 3 Encounters:  10/14/23 164 lb 3.2 oz (74.5 kg)  09/27/23 165 lb (74.8 kg)  08/25/23 164 lb (74.4 kg)    Physical Exam  General appearance - alert, well appearing, and in no distress Mental status - normal mood, behavior, speech, dress, motor activity, and thought processes Neck - supple, no significant adenopathy Chest -breath sounds slightly decreased.  No wheezes or crackles heard.   Heart - normal rate, regular rhythm, normal S1, S2, no murmurs, rubs, clicks or gallops Extremities - peripheral pulses normal, no pedal edema, no clubbing or cyanosis Feet: Good dorsalis and posterior tibialis pulses.  Feet warm.  No cyanosis.  Leap exam normal.    10/14/2023    4:12 PM 04/16/2023    2:15 PM 12/11/2022    2:27 PM  Depression screen PHQ 2/9  Decreased Interest 1 3 2   Down, Depressed, Hopeless 1 3 2   PHQ - 2 Score 2 6 4   Altered sleeping 1 3 3   Tired, decreased energy 2 3 3   Change in appetite 2 3 2   Feeling bad or failure about yourself  0 2 1  Trouble concentrating 0 1 1  Moving slowly or fidgety/restless 0 0 0  Suicidal thoughts 0 0 0  PHQ-9 Score 7 18 14        Latest Ref Rng & Units 09/27/2023   12:12 PM 08/25/2023   12:14 PM 08/02/2023   11:06 AM  CMP  Glucose 70 - 99 mg/dL 70 - 99 mg/dL 81  81  127    BUN 6 - 23 mg/dL 6 - 23 mg/dL 21    21  20     Creatinine 0.40 - 1.20 mg/dL 9.59 - 8.79 mg/dL 8.98    8.98  9.00    Sodium 135 - 145 mEq/L 135 - 145 mEq/L 138    138  137    Potassium 3.5 - 5.1 mEq/L 3.5 - 5.1 mEq/L 4.2    4.2  4.1    Chloride 96 - 112 mEq/L 96 - 112 mEq/L 99     99  101    CO2 19 - 32 mEq/L 19 - 32 mEq/L 29    29  24     Calcium  8.4 - 10.5 mg/dL 8.4 - 89.4 mg/dL 89.9    89.9  9.8    Total Protein 6.0 - 8.3 g/dL 6.0 - 8.3 g/dL 8.3    8.3  8.3    Total Bilirubin 0.2 - 1.2 mg/dL 0.2 - 1.2 mg/dL 0.8    0.8  0.7    Alkaline Phos 39 - 117 U/L 39 - 117 U/L 108    108  111  130   AST 0 - 37 U/L 0 - 37 U/L 34    34  24    ALT 0 - 35 U/L 0 - 35 U/L 24    24  25      Lipid Panel     Component Value Date/Time   CHOL 121 04/19/2022 1207   TRIG 62 04/19/2022 1207   HDL 36 (L) 04/19/2022 1207   CHOLHDL 3.4 04/19/2022 1207   CHOLHDL 4.5 02/19/2014 1226   VLDL NOT CALC 02/19/2014 1226   LDLCALC 72 04/19/2022 1207    CBC    Component Value Date/Time   WBC 13.6 (H) 08/25/2023 1214   RBC 5.22 (H) 08/25/2023 1214   HGB 15.8 (H) 08/25/2023 1214   HGB 15.7 06/04/2023 1411   HCT 47.7 (H) 08/25/2023 1214   HCT 45.1 06/04/2023 1411   PLT 190 08/25/2023 1214   PLT 208 06/04/2023 1411   MCV 91.4 08/25/2023 1214   MCV 87 06/04/2023 1411   MCH 30.3 08/25/2023 1214   MCHC 33.1 08/25/2023 1214   RDW 13.2 08/25/2023 1214   RDW 13.2 06/04/2023 1411   LYMPHSABS 2.5 06/04/2023 1411   MONOABS 0.7 08/06/2022 1735   EOSABS 0.1 06/04/2023 1411   BASOSABS 0.1 06/04/2023 1411    ASSESSMENT AND PLAN: 1. Chronic obstructive pulmonary disease, unspecified COPD type (HCC) Stable.  Advised her that the Symbicort  is written as 2 puffs twice a day not once a day.  Encouraged her to continue to work on trying to quit smoking.  2. Alcoholic cirrhosis of liver without ascites (HCC) (Primary) Stable.  Last LFTs had normalized.  Continue furosemide , spironolactone  and lactulose  as prescribed by her gastroenterologist.  3. Tobacco dependence Commended her on cutting back.  Strongly encouraged to quit.  4. Peripheral polyneuropathy We will check a B12 level.  We will try her with low-dose gabapentin  - Vitamin B12; Future - gabapentin  (NEURONTIN ) 100 MG capsule;  Take 1 capsule (100 mg total) by mouth 2 (two) times daily.  Dispense: 60 capsule; Refill: 1  5. Essential hypertension Controlled on furosemide  and spironolactone .    Patient was given the opportunity to ask questions.  Patient verbalized understanding of the plan and was able to repeat key elements of the plan.   This documentation was completed using Paediatric nurse.  Any transcriptional  errors are unintentional.  Orders Placed This Encounter  Procedures   Vitamin B12     Requested Prescriptions   Signed Prescriptions Disp Refills   gabapentin  (NEURONTIN ) 100 MG capsule 60 capsule 1    Sig: Take 1 capsule (100 mg total) by mouth 2 (two) times daily.    Return in about 4 months (around 02/11/2024).  Barnie Louder, MD, FACP

## 2023-10-16 ENCOUNTER — Other Ambulatory Visit: Payer: Self-pay

## 2023-10-16 ENCOUNTER — Encounter: Payer: Self-pay | Admitting: Gastroenterology

## 2023-10-16 ENCOUNTER — Ambulatory Visit: Payer: 59 | Attending: Internal Medicine

## 2023-10-16 DIAGNOSIS — G629 Polyneuropathy, unspecified: Secondary | ICD-10-CM

## 2023-10-16 DIAGNOSIS — K7031 Alcoholic cirrhosis of liver with ascites: Secondary | ICD-10-CM

## 2023-10-17 LAB — VITAMIN B12: Vitamin B-12: 482 pg/mL (ref 232–1245)

## 2023-10-25 ENCOUNTER — Other Ambulatory Visit: Payer: Self-pay | Admitting: Family Medicine

## 2023-10-25 ENCOUNTER — Other Ambulatory Visit: Payer: Self-pay

## 2023-10-25 MED ORDER — FOLIC ACID 1 MG PO TABS
1.0000 mg | ORAL_TABLET | Freq: Every day | ORAL | 1 refills | Status: DC
  Filled 2023-10-25: qty 90, 90d supply, fill #0
  Filled 2024-01-29: qty 90, 90d supply, fill #1

## 2023-10-31 ENCOUNTER — Other Ambulatory Visit: Payer: Self-pay

## 2023-10-31 ENCOUNTER — Ambulatory Visit
Admission: RE | Admit: 2023-10-31 | Discharge: 2023-10-31 | Disposition: A | Discharge status: Home / Self Care | Payer: 59 | Source: Ambulatory Visit | Attending: Critical Care Medicine | Admitting: Critical Care Medicine

## 2023-10-31 DIAGNOSIS — R928 Other abnormal and inconclusive findings on diagnostic imaging of breast: Secondary | ICD-10-CM | POA: Diagnosis not present

## 2023-10-31 DIAGNOSIS — N6489 Other specified disorders of breast: Secondary | ICD-10-CM

## 2023-11-04 ENCOUNTER — Other Ambulatory Visit (INDEPENDENT_AMBULATORY_CARE_PROVIDER_SITE_OTHER): Payer: 59

## 2023-11-04 DIAGNOSIS — K7031 Alcoholic cirrhosis of liver with ascites: Secondary | ICD-10-CM | POA: Diagnosis not present

## 2023-11-04 LAB — HEPATIC FUNCTION PANEL
ALT: 19 U/L (ref 0–35)
AST: 26 U/L (ref 0–37)
Albumin: 4.2 g/dL (ref 3.5–5.2)
Alkaline Phosphatase: 126 U/L — ABNORMAL HIGH (ref 39–117)
Bilirubin, Direct: 0.1 mg/dL (ref 0.0–0.3)
Total Bilirubin: 0.5 mg/dL (ref 0.2–1.2)
Total Protein: 7.8 g/dL (ref 6.0–8.3)

## 2023-11-06 ENCOUNTER — Encounter: Payer: Self-pay | Admitting: Gastroenterology

## 2023-11-08 ENCOUNTER — Encounter: Payer: Self-pay | Admitting: Gastroenterology

## 2023-11-08 ENCOUNTER — Other Ambulatory Visit: Payer: Self-pay

## 2023-11-08 DIAGNOSIS — K7031 Alcoholic cirrhosis of liver with ascites: Secondary | ICD-10-CM

## 2023-11-08 NOTE — Progress Notes (Signed)
Angel [name], Your alkaline phosphatase did increase slightly.  Alkaline phosphatase is not typically an enzyme associated with autoimmune hepatitis (the ALT value is typically the liver enzyme that corresponds with autoimmune hepatitis activity), but I think we need to follow this closely. I do not think we need to restart your steroids based on this one value, but we need to repeat labs again and get some additional labs.  Let's check your alk phos level and GGT again in 2 weeks.  [name], Will you please order a repeat hepatic panel and GGT to be drawn in 2 weeks?

## 2023-11-13 ENCOUNTER — Encounter: Payer: Self-pay | Admitting: Gastroenterology

## 2023-11-13 ENCOUNTER — Encounter: Payer: Self-pay | Admitting: Internal Medicine

## 2023-11-14 ENCOUNTER — Ambulatory Visit: Payer: 59 | Admitting: Internal Medicine

## 2023-11-14 ENCOUNTER — Other Ambulatory Visit: Payer: Self-pay

## 2023-11-14 ENCOUNTER — Ambulatory Visit: Payer: 59 | Attending: Internal Medicine | Admitting: Internal Medicine

## 2023-11-14 VITALS — BP 123/68 | HR 87 | Temp 98.1°F | Ht 67.0 in | Wt 170.0 lb

## 2023-11-14 DIAGNOSIS — L989 Disorder of the skin and subcutaneous tissue, unspecified: Secondary | ICD-10-CM | POA: Diagnosis not present

## 2023-11-14 DIAGNOSIS — R252 Cramp and spasm: Secondary | ICD-10-CM | POA: Diagnosis not present

## 2023-11-14 DIAGNOSIS — G629 Polyneuropathy, unspecified: Secondary | ICD-10-CM | POA: Diagnosis not present

## 2023-11-14 DIAGNOSIS — N289 Disorder of kidney and ureter, unspecified: Secondary | ICD-10-CM

## 2023-11-14 MED ORDER — GABAPENTIN 100 MG PO CAPS
200.0000 mg | ORAL_CAPSULE | Freq: Two times a day (BID) | ORAL | 3 refills | Status: DC
  Filled 2023-11-14 (×2): qty 120, 30d supply, fill #0
  Filled 2023-12-09: qty 120, 30d supply, fill #1
  Filled 2024-01-06: qty 120, 30d supply, fill #2
  Filled 2024-02-10: qty 120, 30d supply, fill #3

## 2023-11-14 NOTE — Patient Instructions (Signed)
 Stop methocarbamol .  Increase gabapentin  to 200 mg twice a day.  This means you will take 2 of the 100 mg tablets twice a day.  Referral has been submitted to dermatology.

## 2023-11-14 NOTE — Progress Notes (Signed)
 Patient ID: Angel [name], female    DOB: 1956-04-14  MRN: [id number]  CC: Leg Cramps (Worsening leg cramps X 4-5 days sometimes radiating to rib cage/No to pap due to hysterectomy)   Subjective: Angel [name] is a 68 y.o. female who presents for UC visit. Her concerns today include:  Patient with history of HTN, aortic atherosclerosis, tobacco dependence, COPD/bronchiectasis, EtOH cirrhosis, HL, vitamin D  deficiency, malnutrition, strong fhx of RA  Discussed the use of AI scribe software for clinical note transcription with the patient, who gave verbal consent to proceed.  History of Present Illness   The patient, with a history of liver disease, presents with worsening nocturnal leg cramps. The cramps are predominantly in the calf but occasionally extend from the groin to the calf, mostly on the right side. Recently, the patient has also experienced cramping under the right rib cage. The patient has been on muscle relaxants (methocarbamol  and baclofen ) for these symptoms, but the cramps have become more severe recently. Prescribed Methocarbamol  by Dr. Newlin in December.  Several days later, she got post patient for baclofen  from her gastroenterologist Dr. Stacia.  She thinks she is taking the methocarbamol  during the day and the baclofen  at nights.she feels the baclofen  works better than the methocarbamol .  She finds that eating a teaspoon of mustard gets rid of the cramps much faster than the muscle relaxants.  She is not on statin therapy.  She is on furosemide  and spironolactone .  She tells me that the dose of the spironolactone  was decreased from 100 mg daily to 50 mg daily on her last visit with Dr. Stacia. -I note that she had developed some renal insufficiency on chemistry done in November and December with GFR in the upper 50s.  Prior to that she had been in the 60s. -She was prescribed gabapentin  on visit with me last month for neuropathy symptoms in her feet.  She finds the  gabapentin  helpful but feels she may need a little higher dose.    The patient has a strong family history of rheumatoid arthritis and is concerned about the possibility of having the same, because of the cramps and the presence of knotty fingers and occasional locking of fingers. However, the patient denies any pain or swelling in the knuckles, PIP, wrist joints.  The patient also mentions new white, scaly spots on the skin, including one above the eye, and requests a referral to a dermatologist. The patient expresses a desire for improved quality of life, as dx of cirrhosis has been depressing      Patient Active Problem List   Diagnosis Date Noted   Moderately severe major depression (HCC) 10/14/2023   Benign disease of right breast 11/06/2022   Aortic atherosclerosis (HCC) 04/19/2022   Vitamin D  deficiency 04/19/2022   Iron overload 04/19/2022   Cirrhosis (HCC) 04/12/2022   Malnutrition of moderate degree 04/12/2022   HTN (hypertension) 04/11/2022   HLD (hyperlipidemia) 04/11/2022   Hypoalbuminemia 04/11/2022   Alcoholic cirrhosis of liver with ascites (HCC) 04/11/2022   Bronchiectasis without acute exacerbation (HCC) 03/06/2014   COPD (chronic obstructive pulmonary disease) (HCC) 02/19/2014   Environmental allergies 02/19/2014     Current Outpatient Medications on File Prior to Visit  Medication Sig Dispense Refill   albuterol  (VENTOLIN  HFA) 108 (90 Base) MCG/ACT inhaler Inhale 2 puffs into the lungs every 6 (six) hours as needed for wheezing or shortness of breath. 54 g 1   baclofen  (LIORESAL ) 10 MG tablet Take 1 tablet (  10 mg total) by mouth at bedtime. 90 each 1   budesonide -formoterol  (SYMBICORT ) 80-4.5 MCG/ACT inhaler Inhale 2 puffs into the lungs 2 (two) times daily. 10.2 g 3   folic acid  (FOLVITE ) 1 MG tablet Take 1 tablet (1 mg total) by mouth daily. 90 tablet 1   furosemide  (LASIX ) 20 MG tablet Take 1 tablet (20 mg total) by mouth daily. For one week then one daily 30  tablet 3   hydrOXYzine  (ATARAX ) 25 MG tablet Take 1 tablet (25 mg total) by mouth daily as needed for itching. 90 tablet 0   ipratropium-albuterol  (DUONEB) 0.5-2.5 (3) MG/3ML SOLN Take 3 mLs by nebulization every 6 (six) hours as needed. 360 mL 0   lactulose  (CHRONULAC ) 10 GM/15ML solution Take 15 mLs (10 g total) by mouth 2 (two) times daily. 900 mL 2   Multiple Vitamins-Minerals (CENTRUM SILVER PO) Take 1 tablet by mouth daily.     omeprazole  (PRILOSEC) 20 MG capsule Take 1 capsule (20 mg total) by mouth daily. 60 capsule 2   OVER THE COUNTER MEDICATION Apply 1 application  topically 2 (two) times daily as needed (joint pain). Tiger Balm PO     spironolactone  (ALDACTONE ) 50 MG tablet Take 1 tablet (50 mg total) by mouth daily. 30 tablet 2   thiamine  (VITAMIN B1) 100 MG tablet Take 1 tablet (100 mg total) by mouth daily. 100 tablet 0   Trolamine Salicylate (ASPERCREME EX) Apply 1 Application topically See admin instructions. 1 application 3-4 times a day as needed for joint pain     Vitamin D , Ergocalciferol , (DRISDOL ) 1.25 MG (50000 UNIT) CAPS capsule Take 1 capsule (50,000 Units total) by mouth every 7 (seven) days. 12 capsule 5   No current facility-administered medications on file prior to visit.    Allergies  Allergen Reactions   Penicillins Hives    Social History   Socioeconomic History   Marital status: Single    Spouse name: Not on file   Number of children: 0   Years of education: Not on file   Highest education level: Associate degree: academic program  Occupational History   Occupation: Unemployed  Tobacco Use   Smoking status: Former    Current packs/day: 0.00    Average packs/day: 0.5 packs/day for 30.0 years (15.0 ttl pk-yrs)    Types: Cigarettes    Start date: 12/31/1983    Quit date: 12/30/2013    Years since quitting: 9.8   Smokeless tobacco: Never  Vaping Use   Vaping status: Never Used  Substance and Sexual Activity   Alcohol use: Yes    Alcohol/week:  28.0 standard drinks of alcohol    Types: 28 Glasses of wine per week   Drug use: Yes    Frequency: 3.0 times per week    Types: Marijuana   Sexual activity: Not Currently  Other Topics Concern   Not on file  Social History Narrative   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    Never pregnant, NO husband   Social drinkier    Quite smoking 3/15   Social Drivers of Health   Financial Resource Strain: Low Risk  (11/14/2023)   Overall Financial Resource Strain (CARDIA)    Difficulty of Paying Living Expenses: Not very hard  Food Insecurity: No Food Insecurity (11/14/2023)   Hunger Vital Sign    Worried About Running Out of Food in the Last Year: Never true  Ran Out of Food in the Last Year: Never true  Recent Concern: Food Insecurity - Food Insecurity Present (10/12/2023)   Hunger Vital Sign    Worried About Running Out of Food in the Last Year: Often true    Ran Out of Food in the Last Year: Sometimes true  Transportation Needs: No Transportation Needs (11/14/2023)   PRAPARE - Administrator, Civil Service (Medical): No    Lack of Transportation (Non-Medical): No  Physical Activity: Inactive (11/14/2023)   Exercise Vital Sign    Days of Exercise per Week: 0 days    Minutes of Exercise per Session: 0 min  Stress: Stress Concern Present (11/14/2023)   [employer] of Occupational Health - Occupational Stress Questionnaire    Feeling of Stress : Rather much  Social Connections: Moderately Isolated (10/14/2023)   Social Connection and Isolation Panel [NHANES]    Frequency of Communication with Friends and Family: More than three times a week    Frequency of Social Gatherings with Friends and Family: Twice a week    Attends Religious Services: Never    Database Administrator or Organizations: Yes    Attends Banker Meetings: Never    Marital Status: Divorced  Catering Manager Violence: Not  At Risk (11/14/2023)   Humiliation, Afraid, Rape, and Kick questionnaire    Fear of Current or Ex-Partner: No    Emotionally Abused: No    Physically Abused: No    Sexually Abused: No    Family History  Problem Relation Age of Onset   Breast cancer Mother    Colon cancer Mother    CAD Father    Heart disease Father    Breast cancer Sister    Emphysema Paternal Grandfather        never smoker, theatre stage manager   Liver disease Neg Hx    Esophageal cancer Neg Hx     Past Surgical History:  Procedure Laterality Date   ABDOMINAL HYSTERECTOMY     APPENDECTOMY     BACK SURGERY      ROS: Review of Systems Negative except as stated above  PHYSICAL EXAM: BP 123/68 (BP Location: Left Arm, Patient Position: Sitting, Cuff Size: Normal)   Pulse 87   Temp 98.1 F (36.7 C) (Oral)   Ht 5' 7 (1.702 m)   Wt 170 lb (77.1 kg)   SpO2 92%   BMI 26.63 kg/m   Physical Exam  General appearance - alert, well appearing, and in no distress Mental status - normal mood, behavior, speech, dress, motor activity, and thought processes Extremities -no lower extremity edema.  Veins are easily outlined in her feet and in the lower extremities.  Dorsalis pedis and posterior tibialis pulses are 3+ bilaterally.  Popliteal and femoral pulses are 3+ bilaterally.  Legs are warm.  No cyanosis noted. Skin -she has small white raised spots scattered on the lower extremities left greater than right.  She has 1 spot that is less than half a centimeter noted above the left eyebrow.     11/14/2023    1:18 PM 10/14/2023    4:12 PM 04/16/2023    2:15 PM  Depression screen PHQ 2/9  Decreased Interest 3 1 3   Down, Depressed, Hopeless 2 1 3   PHQ - 2 Score 5 2 6   Altered sleeping 3 1 3   Tired, decreased energy 3 2 3   Change in appetite 3 2 3   Feeling bad or failure about yourself  0 0 2  Trouble concentrating 1 0 1  Moving slowly or fidgety/restless 0 0 0  Suicidal thoughts 0 0 0  PHQ-9 Score 15 7 18   Difficult doing  work/chores Somewhat difficult         Latest Ref Rng & Units 11/04/2023    3:43 PM 09/27/2023   12:12 PM 08/25/2023   12:14 PM  CMP  Glucose 70 - 99 mg/dL 70 - 99 mg/dL  81    81  872   BUN 6 - 23 mg/dL 6 - 23 mg/dL  21    21  20    Creatinine 0.40 - 1.20 mg/dL 9.59 - 8.79 mg/dL  8.98    8.98  9.00   Sodium 135 - 145 mEq/L 135 - 145 mEq/L  138    138  137   Potassium 3.5 - 5.1 mEq/L 3.5 - 5.1 mEq/L  4.2    4.2  4.1   Chloride 96 - 112 mEq/L 96 - 112 mEq/L  99    99  101   CO2 19 - 32 mEq/L 19 - 32 mEq/L  29    29  24    Calcium  8.4 - 10.5 mg/dL 8.4 - 89.4 mg/dL  89.9    89.9  9.8   Total Protein 6.0 - 8.3 g/dL 7.8  8.3    8.3  8.3   Total Bilirubin 0.2 - 1.2 mg/dL 0.5  0.8    0.8  0.7   Alkaline Phos 39 - 117 U/L 126  108    108  111   AST 0 - 37 U/L 26  34    34  24   ALT 0 - 35 U/L 19  24    24  25     Lipid Panel     Component Value Date/Time   CHOL 121 04/19/2022 1207   TRIG 62 04/19/2022 1207   HDL 36 (L) 04/19/2022 1207   CHOLHDL 3.4 04/19/2022 1207   CHOLHDL 4.5 02/19/2014 1226   VLDL NOT CALC 02/19/2014 1226   LDLCALC 72 04/19/2022 1207    CBC    Component Value Date/Time   WBC 13.6 (H) 08/25/2023 1214   RBC 5.22 (H) 08/25/2023 1214   HGB 15.8 (H) 08/25/2023 1214   HGB 15.7 06/04/2023 1411   HCT 47.7 (H) 08/25/2023 1214   HCT 45.1 06/04/2023 1411   PLT 190 08/25/2023 1214   PLT 208 06/04/2023 1411   MCV 91.4 08/25/2023 1214   MCV 87 06/04/2023 1411   MCH 30.3 08/25/2023 1214   MCHC 33.1 08/25/2023 1214   RDW 13.2 08/25/2023 1214   RDW 13.2 06/04/2023 1411   LYMPHSABS 2.5 06/04/2023 1411   MONOABS 0.7 08/06/2022 1735   EOSABS 0.1 06/04/2023 1411   BASOSABS 0.1 06/04/2023 1411    ASSESSMENT AND PLAN: 1. Muscle cramps (Primary) Of questionable etiology.  Could be due to cirrhosis itself versus diuretics.  Will check chemistry. Advised to stop methocarbamol . Increase baclofen  to 10 mg twice a day.  She still has a lot of left from  the prescription that was written by Dr. Stacia in December. - Basic Metabolic Panel  2. Skin lesion - Ambulatory referral to Dermatology  3. Renal insufficiency Likely due to the fact that she is on lactulose , spironolactone  and furosemide .  Advised to make sure that she is drinking adequate water during the day. - Basic Metabolic Panel  4. Peripheral polyneuropathy Reports some improvement of her symptoms with gabapentin  and would like a higher dose.  We will increase it from 100 mg twice a day to 200 mg twice a day. - gabapentin  (NEURONTIN ) 100 MG capsule; Take 2 capsules (200 mg total) by mouth 2 (two) times daily.  Dispense: 120 capsule; Refill: 3   Patient was given the opportunity to ask questions.  Patient verbalized understanding of the plan and was able to repeat key elements of the plan.   This documentation was completed using Paediatric nurse.  Any transcriptional errors are unintentional.  Orders Placed This Encounter  Procedures   Basic Metabolic Panel   Ambulatory referral to Dermatology     Requested Prescriptions   Signed Prescriptions Disp Refills   gabapentin  (NEURONTIN ) 100 MG capsule 120 capsule 3    Sig: Take 2 capsules (200 mg total) by mouth 2 (two) times daily.    No follow-ups on file.  Barnie Louder, MD, FACP

## 2023-11-15 ENCOUNTER — Encounter: Payer: Self-pay | Admitting: Internal Medicine

## 2023-11-15 LAB — BASIC METABOLIC PANEL
BUN/Creatinine Ratio: 26 (ref 12–28)
BUN: 27 mg/dL (ref 8–27)
CO2: 24 mmol/L (ref 20–29)
Calcium: 9.8 mg/dL (ref 8.7–10.3)
Chloride: 102 mmol/L (ref 96–106)
Creatinine, Ser: 1.05 mg/dL — ABNORMAL HIGH (ref 0.57–1.00)
Glucose: 152 mg/dL — ABNORMAL HIGH (ref 70–99)
Potassium: 5 mmol/L (ref 3.5–5.2)
Sodium: 139 mmol/L (ref 134–144)
eGFR: 59 mL/min/{1.73_m2} — ABNORMAL LOW (ref >59)

## 2023-11-22 ENCOUNTER — Other Ambulatory Visit: Payer: Self-pay | Admitting: Critical Care Medicine

## 2023-11-22 ENCOUNTER — Other Ambulatory Visit: Payer: Self-pay

## 2023-11-22 ENCOUNTER — Other Ambulatory Visit (INDEPENDENT_AMBULATORY_CARE_PROVIDER_SITE_OTHER): Payer: 59

## 2023-11-22 DIAGNOSIS — K7031 Alcoholic cirrhosis of liver with ascites: Secondary | ICD-10-CM | POA: Diagnosis not present

## 2023-11-22 LAB — HEPATIC FUNCTION PANEL
ALT: 16 U/L (ref 0–35)
AST: 21 U/L (ref 0–37)
Albumin: 3.9 g/dL (ref 3.5–5.2)
Alkaline Phosphatase: 146 U/L — ABNORMAL HIGH (ref 39–117)
Bilirubin, Direct: 0 mg/dL (ref 0.0–0.3)
Total Bilirubin: 0.4 mg/dL (ref 0.2–1.2)
Total Protein: 7.6 g/dL (ref 6.0–8.3)

## 2023-11-22 LAB — GAMMA GT: GGT: 36 U/L (ref 7–51)

## 2023-11-22 MED ORDER — OMEPRAZOLE 20 MG PO CPDR
20.0000 mg | DELAYED_RELEASE_CAPSULE | Freq: Every day | ORAL | 2 refills | Status: DC
  Filled 2023-11-22: qty 60, 60d supply, fill #0
  Filled 2024-01-20: qty 60, 60d supply, fill #1
  Filled 2024-03-18 – 2024-04-13 (×3): qty 60, 60d supply, fill #2

## 2023-11-22 NOTE — Telephone Encounter (Signed)
Requested medication (s) are due for refill today: yes  Requested medication (s) are on the active medication list: yes  Last refill:  04/16/23 #60 2 refills  Future visit scheduled: yes in 2 months   Notes to clinic:  last ordered by Dr. Delford Field  04/16/23 do you want to refill Rx?     Requested Prescriptions  Pending Prescriptions Disp Refills   omeprazole (PRILOSEC) 20 MG capsule 60 capsule 2    Sig: Take 1 capsule (20 mg total) by mouth daily.     Gastroenterology: Proton Pump Inhibitors Passed - 11/22/2023 10:40 AM      Passed - Valid encounter within last 12 months    Recent Outpatient Visits           1 week ago Muscle cramps   [facility] [place]. [facility] Jonah Blue B, MD   1 month ago Chronic obstructive pulmonary disease, unspecified COPD type [facility])   [facility] [place]. [facility] Jonah Blue B, MD   5 months ago Alcoholic cirrhosis of liver with ascites [facility])   [facility] [place]. [facility] Storm Frisk, MD   7 months ago Alcoholic cirrhosis of liver with ascites [facility])   [facility] Of [place]. [facility] Storm Frisk, MD   11 months ago Alcoholic cirrhosis of liver with ascites [facility])    [facility] Of . [facility] Storm Frisk, MD       Future Appointments             In 2 months Laural Benes Binnie Rail, MD [facility] - A Dept Of [place] Bridegroom. [facility]

## 2023-11-24 ENCOUNTER — Other Ambulatory Visit: Payer: Self-pay | Admitting: Family Medicine

## 2023-11-25 ENCOUNTER — Other Ambulatory Visit: Payer: Self-pay

## 2023-11-25 MED ORDER — HYDROXYZINE HCL 25 MG PO TABS
25.0000 mg | ORAL_TABLET | Freq: Every day | ORAL | 0 refills | Status: DC | PRN
  Filled 2023-11-25: qty 90, 90d supply, fill #0

## 2023-11-26 ENCOUNTER — Encounter: Payer: Self-pay | Admitting: Gastroenterology

## 2023-11-26 ENCOUNTER — Other Ambulatory Visit: Payer: Self-pay

## 2023-11-26 NOTE — Progress Notes (Signed)
Ms. Nappi,  Your alk phos has crept up some more.  We will probably need to restart medications.  Will discuss further with you at your appointment on Friday.

## 2023-11-29 ENCOUNTER — Ambulatory Visit: Payer: 59 | Admitting: Gastroenterology

## 2023-11-29 ENCOUNTER — Other Ambulatory Visit: Payer: Self-pay

## 2023-11-29 ENCOUNTER — Encounter: Payer: Self-pay | Admitting: Gastroenterology

## 2023-11-29 ENCOUNTER — Other Ambulatory Visit (INDEPENDENT_AMBULATORY_CARE_PROVIDER_SITE_OTHER): Payer: 59

## 2023-11-29 VITALS — BP 124/90 | HR 96 | Ht 63.78 in | Wt 174.0 lb

## 2023-11-29 DIAGNOSIS — Z9189 Other specified personal risk factors, not elsewhere classified: Secondary | ICD-10-CM

## 2023-11-29 DIAGNOSIS — Z87891 Personal history of nicotine dependence: Secondary | ICD-10-CM | POA: Diagnosis not present

## 2023-11-29 DIAGNOSIS — R590 Localized enlarged lymph nodes: Secondary | ICD-10-CM

## 2023-11-29 DIAGNOSIS — K7031 Alcoholic cirrhosis of liver with ascites: Secondary | ICD-10-CM

## 2023-11-29 DIAGNOSIS — K140 Glossitis: Secondary | ICD-10-CM

## 2023-11-29 DIAGNOSIS — Z800 Family history of malignant neoplasm of digestive organs: Secondary | ICD-10-CM

## 2023-11-29 DIAGNOSIS — R252 Cramp and spasm: Secondary | ICD-10-CM

## 2023-11-29 DIAGNOSIS — J449 Chronic obstructive pulmonary disease, unspecified: Secondary | ICD-10-CM

## 2023-11-29 DIAGNOSIS — E559 Vitamin D deficiency, unspecified: Secondary | ICD-10-CM | POA: Diagnosis not present

## 2023-11-29 DIAGNOSIS — F1090 Alcohol use, unspecified, uncomplicated: Secondary | ICD-10-CM

## 2023-11-29 LAB — VITAMIN D 25 HYDROXY (VIT D DEFICIENCY, FRACTURES): VITD: 64.95 ng/mL (ref 30.00–100.00)

## 2023-11-29 MED ORDER — BACLOFEN 10 MG PO TABS
10.0000 mg | ORAL_TABLET | Freq: Every day | ORAL | 3 refills | Status: DC
  Filled 2023-11-29 – 2023-12-09 (×3): qty 180, 180d supply, fill #0
  Filled 2023-12-23: qty 90, 90d supply, fill #0
  Filled 2024-02-10: qty 90, 90d supply, fill #1

## 2023-11-29 NOTE — Progress Notes (Signed)
Discussed the use of AI scribe software for clinical note transcription with the patient, who gave verbal consent to proceed.  HPI : Angel [name] is a 68 y.o. 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  She was initially seen by me in October 2024  Please see that note for a description of her initial presentation of cirrhosis and initial therapy.   She was last seen by me December 2024 following a liver biopsy which showed features of early cirrhosis, but no clear features of autoimmune hepatitis or steatohepatitis.  As the diagnosis of autoimmune hepatitis was not certain given the underlying alcohol liver disease at the time of her presentation, I recommended she stop taking her prednisone at that visit.   We have been following her liver enzymes since then, and her alkaline phosphatase has slowly and slightly increased, going from 108 in December, 11/08/2024 in January to 146 on February 14. Her GGT has decreased however, going from 52 in October, to 41 in December and 36 February 14. She has been off prednisone since December, after being on it for over a year.  We had also started her on baclofen at that visit for bothersome nocturnal cramps.  This did help, but she was still having some symptoms, and her PCP increased her dose to twice daily (but she has been taking 2 tabs at night instead).  This increased dose has worked well for her, and her cramps have been minimal since increasing the dose.  She experiences severe burning and stinging pain in her feet, which persists regardless of position. The pain has intensified over the last few days but shows some improvement today, which she thinks may be due to increasing dose of gabapentin recently.  Her Lasix and Aldactone doses were decreased at her last visit, and she has not noticed any worsening of her lower extremity swelling, and denies any  abdominal swelling.  She has a history of arthritis, with joint pain primarily in her knees and ankles, described as shooting from her toes to the ball of her foot, especially at night, making it difficult to tolerate covers on her feet. She has noticed nodules on her hands and elbows but reports no pain in her hands.  She has a diagnosis of COPD, but has never seen a pulmonologist.  She was referred to pulmonology at her last visit, and she has an appointment next month. She uses Symbicort twice daily and a rescue inhaler as needed for breathing issues. She also uses a breathing machine once or twice daily, which she finds beneficial. She reports a dry mouth and occasional tongue fissures, which she attributes to smoking. She has a history of smoking, currently reduced to two or three cigarettes a day  She reports having enlarged cervical lymph nodes.  She states that these lymph nodes will increase in size and then decrease, and this has been going on for a while.  She recalls having enlarged lymph nodes even as a child.    She continues to completely abstain from alcohol.   Past Medical History:  Diagnosis Date   Alcohol use 04/19/2022   Alcoholic cirrhosis (HCC)    Asthma    Bronchiolitis    Cirrhosis (HCC)    COPD (chronic obstructive pulmonary disease) (HCC)    Hypertension    Smoker 12/31/2013     Past Surgical History:  Procedure Laterality Date   ABDOMINAL HYSTERECTOMY  APPENDECTOMY     BACK SURGERY     Family History  Problem Relation Age of Onset   Breast cancer Mother    Colon cancer Mother    CAD Father    Heart disease Father    Breast cancer Sister    Emphysema Paternal Grandfather        never smoker, Theatre stage manager   Cancer Cousin        blood cancer, unknown type   Liver disease Neg Hx    Esophageal cancer Neg Hx    Social History   Tobacco Use   Smoking status: Former    Current packs/day: 0.00    Average packs/day: 0.5 packs/day for 30.0 years  (15.0 ttl pk-yrs)    Types: Cigarettes    Start date: 12/31/1983    Quit date: 12/30/2013    Years since quitting: 9.9   Smokeless tobacco: Never  Vaping Use   Vaping status: Never Used  Substance Use Topics   Alcohol use: Yes    Alcohol/week: 28.0 standard drinks of alcohol    Types: 28 Glasses of wine per week   Drug use: Yes    Frequency: 3.0 times per week    Types: Marijuana   Current Outpatient Medications  Medication Sig Dispense Refill   albuterol (VENTOLIN HFA) 108 (90 Base) MCG/ACT inhaler Inhale 2 puffs into the lungs every 6 (six) hours as needed for wheezing or shortness of breath. 54 g 1   baclofen (LIORESAL) 10 MG tablet Take 1 tablet (10 mg total) by mouth at bedtime. (Patient taking differently: Take 20 mg by mouth at bedtime.) 90 each 1   budesonide-formoterol (SYMBICORT) 80-4.5 MCG/ACT inhaler Inhale 2 puffs into the lungs 2 (two) times daily. 10.2 g 3   folic acid (FOLVITE) 1 MG tablet Take 1 tablet (1 mg total) by mouth daily. 90 tablet 1   furosemide (LASIX) 20 MG tablet Take 1 tablet (20 mg total) by mouth daily. For one week then one daily 30 tablet 3   gabapentin (NEURONTIN) 100 MG capsule Take 2 capsules (200 mg total) by mouth 2 (two) times daily. 120 capsule 3   hydrOXYzine (ATARAX) 25 MG tablet Take 1 tablet (25 mg total) by mouth daily as needed for itching. 90 tablet 0   ipratropium-albuterol (DUONEB) 0.5-2.5 (3) MG/3ML SOLN Take 3 mLs by nebulization every 6 (six) hours as needed. 360 mL 0   lactulose (CHRONULAC) 10 GM/15ML solution Take 15 mLs (10 g total) by mouth 2 (two) times daily. 900 mL 2   Multiple Vitamins-Minerals (CENTRUM SILVER PO) Take 1 tablet by mouth daily.     omeprazole (PRILOSEC) 20 MG capsule Take 1 capsule (20 mg total) by mouth daily. 60 capsule 2   OVER THE COUNTER MEDICATION Apply 1 application  topically 2 (two) times daily as needed (joint pain). Tiger Balm PO     spironolactone (ALDACTONE) 50 MG tablet Take 1 tablet (50 mg total)  by mouth daily. 30 tablet 2   thiamine (VITAMIN B1) 100 MG tablet Take 1 tablet (100 mg total) by mouth daily. 100 tablet 0   Trolamine Salicylate (ASPERCREME EX) Apply 1 Application topically See admin instructions. 1 application 3-4 times a day as needed for joint pain     Vitamin D, Ergocalciferol, (DRISDOL) 1.25 MG (50000 UNIT) CAPS capsule Take 1 capsule (50,000 Units total) by mouth every 7 (seven) days. 12 capsule 5   No current facility-administered medications for this visit.   Allergies  Allergen  Reactions   Penicillins Hives     Review of Systems: All systems reviewed and negative except where noted in HPI.    MM 3D DIAGNOSTIC MAMMOGRAM BILATERAL BREAST Result Date: 10/31/2023 CLINICAL DATA:  One year interval follow-up of likely benign focal asymmetries in the RIGHT breast without sonographic correlate. Annual evaluation, LEFT breast. EXAM: DIGITAL DIAGNOSTIC BILATERAL MAMMOGRAM WITH TOMOSYNTHESIS AND CAD TECHNIQUE: Bilateral digital diagnostic mammography and breast tomosynthesis was performed. The images were evaluated with computer-aided detection. COMPARISON:  Previous exam(s). ACR Breast Density Category b: There are scattered areas of fibroglandular density. FINDINGS: Full field CC and MLO views of both breasts were obtained. RIGHT: The focal asymmetry in the subareolar location at anterior depth is unchanged. Similarly, the focal asymmetry in the upper breast at middle depth is unchanged. No new or suspicious findings elsewhere. LEFT: No findings suspicious for malignancy. IMPRESSION: 1. Stable likely benign focal asymmetries in the RIGHT breast. 2. No mammographic evidence of malignancy involving the LEFT breast. RECOMMENDATION: BILATERAL diagnostic mammography in 1 year (in order to confirm 2 years of stability of the likely benign RIGHT breast asymmetries). I have discussed the findings and recommendations with the patient. If applicable, a reminder letter will be sent to the  patient regarding the next appointment. BI-RADS CATEGORY  3: Probably benign. Electronically Signed   By: Hulan Saas M.D.   On: 10/31/2023 13:49    Physical Exam: BP (!) 124/90 (BP Location: Left Arm, Patient Position: Sitting, Cuff Size: Normal)   Pulse 96   Ht 5' 3.78" (1.62 m)   Wt 174 lb (78.9 kg)   BMI 30.07 kg/m  Constitutional: Pleasant,well-developed, Caucasian female in no acute distress. HEENT: Normocephalic and atraumatic. Conjunctivae are normal. No scleral icterus. Neck supple.  Cardiovascular: Normal rate, regular rhythm.  Pulmonary/chest: Effort normal.  Bilateral wheezing and rhonchi noted, similar to previous exams. Abdominal: Soft, nondistended, nontender. Bowel sounds active throughout. There are no masses palpable. No hepatomegaly. Extremities: no edema Lymphadenopathy: Bilateral enlarged anterior cervical lymph nodes, rounded, mobile, nontender Neurological: Alert and oriented to person place and time. Skin: Skin is warm and dry. No rashes noted. Psychiatric: Normal mood and affect. Behavior is normal.  CBC    Component Value Date/Time   WBC 13.6 (H) 08/25/2023 1214   RBC 5.22 (H) 08/25/2023 1214   HGB 15.8 (H) 08/25/2023 1214   HGB 15.7 06/04/2023 1411   HCT 47.7 (H) 08/25/2023 1214   HCT 45.1 06/04/2023 1411   PLT 190 08/25/2023 1214   PLT 208 06/04/2023 1411   MCV 91.4 08/25/2023 1214   MCV 87 06/04/2023 1411   MCH 30.3 08/25/2023 1214   MCHC 33.1 08/25/2023 1214   RDW 13.2 08/25/2023 1214   RDW 13.2 06/04/2023 1411   LYMPHSABS 2.5 06/04/2023 1411   MONOABS 0.7 08/06/2022 1735   EOSABS 0.1 06/04/2023 1411   BASOSABS 0.1 06/04/2023 1411    CMP     Component Value Date/Time   NA 139 11/14/2023 1217   K 5.0 11/14/2023 1217   CL 102 11/14/2023 1217   CO2 24 11/14/2023 1217   GLUCOSE 152 (H) 11/14/2023 1217   GLUCOSE 81 09/27/2023 1212   GLUCOSE 81 09/27/2023 1212   BUN 27 11/14/2023 1217   CREATININE 1.05 (H) 11/14/2023 1217    CREATININE 0.79 02/19/2014 1226   CALCIUM 9.8 11/14/2023 1217   PROT 7.6 11/22/2023 1601   PROT 7.7 06/04/2023 1411   ALBUMIN 3.9 11/22/2023 1601   ALBUMIN 4.3 06/04/2023 1411  AST 21 11/22/2023 1601   ALT 16 11/22/2023 1601   ALKPHOS 146 (H) 11/22/2023 1601   BILITOT 0.4 11/22/2023 1601   BILITOT 0.3 06/04/2023 1411   GFRNONAA >60 08/25/2023 1214   GFRNONAA 85 02/19/2014 1226   GFRAA >60 04/17/2019 0514   GFRAA >89 02/19/2014 1226       Latest Ref Rng & Units 08/25/2023   12:14 PM 08/15/2023   11:39 AM 06/04/2023    2:11 PM  CBC EXTENDED  WBC 4.0 - 10.5 K/uL 13.6  11.7  10.3   RBC 3.87 - 5.11 MIL/uL 5.22  4.95  5.17   Hemoglobin 12.0 - 15.0 g/dL 82.9  56.2  13.0   HCT 36.0 - 46.0 % 47.7  44.9  45.1   Platelets 150 - 400 K/uL 190  212  208   NEUT# 1.4 - 7.0 x10E3/uL   6.4   Lymph# 0.7 - 3.1 x10E3/uL   2.5    MELD 3.0: 7 at 06/04/2023  2:11 PM MELD-Na: 6 at 06/04/2023  2:11 PM Calculated from: Serum Creatinine: 0.96 mg/dL (Using min of 1 mg/dL) at 8/65/7846  9:62 PM Serum Sodium: 141 mmol/L (Using max of 137 mmol/L) at 06/04/2023  2:11 PM Total Bilirubin: 0.3 mg/dL (Using min of 1 mg/dL) at 9/52/8413  2:44 PM Serum Albumin: 4.3 g/dL (Using max of 3.5 g/dL) at 0/07/2724  3:66 PM INR(ratio): 1 at 06/04/2023  2:11 PM Age at listing (hypothetical): 63 years Sex: Female at 06/04/2023  2:11 PM     ASSESSMENT AND PLAN:   Recompensated cirrhosis, 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 She was transition to Imuran, but she stopped this due to side effects (nausea) after only a week or two. She had been maintained on low-dose steroids (5-10 mg) for about a year with normalization of liver enzymes, INR and  platelets, until steroids were stopped in Dec 2024. Her MELD is now 7. She does have a strong family history of autoimmune disease, so autoimmune hepatitis is certainly possible. However, given the underlying heavy alcohol use, her cirrhosis could be entirely secondary to alcohol and the ASM antibody could be a false positive. Liver biopsy Nov 2024 did not show definitive features of AIH or PBC and it was recommended she start to wean completely off prednisone with close monitoring of liver enzymes.  Since stopping steroids, she has had a slight increase in her alkaline phosphatase, but her GGT has remained normal, suggesting that the elevated alk phos may not be hepatic (she is having some arthritis symptoms and bony nodules). Discussed potential need for reintroduction of azathioprine or 6-mercaptopurine if liver enzymes worsen, with desire to avoid chronic steroid use. Risks of long-term prednisone reviewed. - Repeat CMP and GGT in 1 month - Order fractionated alkaline phosphatase test at that time - If tests suggestive of hepatic alk phos elevation, will consider restarting therapy for autoimmune hepatitis (although very atypical given alk phos predominance) - Schedule for variceal screening EGD at follow up - Continue complete alcohol abstinence   History of ascites - Continue low dose aldactone and lasix (50 mg, 20 mg respectively)   History of hepatic encephalopathy - Continue lactulose BID for now   Muscle Cramps Significantly improved with baclofen 20 mg at night -Continue baclofen  20 mg at night   COPD Chronic obstructive pulmonary disease vs asthma, uncontrolled. Symptoms include wheezing and dyspnea. Still has active wheezing bilaterally on exam.  Current management includes Symbicort (budesonide/formoterol), albuterol puffer as needed, and albuterol nebulizer once daily.  - Patient to see pulmonology next month - Continue Symbicort daily - Continue albuterol inhaler/nebulizer as  needed - Will reassess for EGD/colonoscopy following pulmonology eval. - Reinforced importance of complete smoking cessation   Family history of colon cancer - Will defer elective colonoscopy again given active wheezing on exam.  Refer to pulmonology for further characterization and management of COPD - Will reassess candidacy for colonoscopy at follow up.  Osteoarthritis Hand/fingers nodules and joint pain, particularly in the knees and ankles.  PCP (Dr. Laural Benes) felt rheumatoid arthritis very unlikely.  This may be contributing to elevated alk phos.  She has been taking NSAIDs, and I recommended she avoid NSAIDs, as this can increase her risk for kidney disease, as well as gastrointestinal bleeding.  I recommended she take acetaminophen as needed for joint pain, with a maximum dose of 2 g/day. -Recommend Tylenol preferentially for musculoskeletal pain, as long as it is less than 2 g/day  Oral Lesion Sore on the tongue, not consistent with candidiasis. Patient uses hydrogen peroxide for relief. No signs of candidiasis observed.  B12 level normal last month.  May be related to smoking.  Will also check zinc level. - Monitor oral lesion - Check zinc level  Cervical lymphadenopathy Prominent lymph nodes noted, but patient states that these wax and wane in size, and have been present for years.  Advised patient to follow these closely, and to follow-up with her PCP to discuss further evaluation such as imaging or biopsy if the lymph nodes do not decrease in size.  Low suspicion for malignancy given the chronicity of these lymph nodes, but the size of the lymph nodes was impressive. -Monitor, consider imaging/biopsy if not resolving within the next few weeks  General Health Maintenance High risk for osteoporosis due to long-term steroid use, smoking, and liver disease.  Questionable family history of osteoporosis. Vitamin D supplementation ongoing, but levels need re-evaluation. - Order bone  density scan  Vitamin D deficiency - Repeat vitamin D level  Follow-up - Schedule liver ultrasound in May 2025 - Plan for colonoscopy and upper endoscopy in summer 2025 - Follow up with primary care physician on Feb 13, 2024.        Storm Frisk, MD

## 2023-11-29 NOTE — Patient Instructions (Signed)
You have been scheduled for a bone density test on  at . Please arrive 15 minutes prior to your scheduled appointment to radiology on the basement floor of  [facility] location for this test. If you need to cancel or reschedule for any reason, please contact radiology at [phone].  Preparation for test is as follows:  If you are taking calcium, discontinue this 24-48 hours prior to your appointment.  Wear pants with an elastic waistband (or without any metal such as a zipper).  Do not wear an underwire bra.  We do have gowns if you are unable to find appropriate clothing without metal.  Please bring a list of all current medications.  Your provider has requested that you go to the basement level for lab work before leaving today. Press "B" on the elevator. The lab is located at the first door on the left as you exit the elevator.  _______________________________________________________  If your blood pressure at your visit was 140/90 or greater, please contact your primary care physician to follow up on this.  _______________________________________________________  If you are age 65 or older, your body mass index should be between 23-30. Your Body mass index is 30.07 kg/m. If this is out of the aforementioned range listed, please consider follow up with your Primary Care Provider.  If you are age 35 or younger, your body mass index should be between 19-25. Your Body mass index is 30.07 kg/m. If this is out of the aformentioned range listed, please consider follow up with your Primary Care Provider.   ________________________________________________________  The  GI providers would like to encourage you to use [facility] to communicate with providers for non-urgent requests or questions.  Due to long hold times on the telephone, sending your provider a message by [facility] may be a faster and more efficient way to get a response.  Please allow 48 business hours for a  response.  Please remember that this is for non-urgent requests.  _______________________________________________________    It was a pleasure to see you today!  Thank you for trusting me with your gastrointestinal care!    Scott E.Tomasa Rand, MD

## 2023-12-03 ENCOUNTER — Other Ambulatory Visit: Payer: Self-pay

## 2023-12-04 LAB — ZINC: Zinc: 70 ug/dL (ref 60–130)

## 2023-12-05 ENCOUNTER — Other Ambulatory Visit: Payer: Self-pay

## 2023-12-05 ENCOUNTER — Ambulatory Visit
Admission: RE | Admit: 2023-12-05 | Discharge: 2023-12-05 | Discharge status: Home / Self Care | Payer: 59 | Source: Ambulatory Visit | Attending: Gastroenterology

## 2023-12-05 DIAGNOSIS — L814 Other melanin hyperpigmentation: Secondary | ICD-10-CM | POA: Diagnosis not present

## 2023-12-05 DIAGNOSIS — L720 Epidermal cyst: Secondary | ICD-10-CM | POA: Diagnosis not present

## 2023-12-05 DIAGNOSIS — L708 Other acne: Secondary | ICD-10-CM | POA: Diagnosis not present

## 2023-12-05 DIAGNOSIS — D225 Melanocytic nevi of trunk: Secondary | ICD-10-CM | POA: Diagnosis not present

## 2023-12-05 DIAGNOSIS — K7031 Alcoholic cirrhosis of liver with ascites: Secondary | ICD-10-CM

## 2023-12-05 DIAGNOSIS — L821 Other seborrheic keratosis: Secondary | ICD-10-CM | POA: Diagnosis not present

## 2023-12-09 ENCOUNTER — Other Ambulatory Visit: Payer: Self-pay

## 2023-12-09 ENCOUNTER — Encounter: Payer: Self-pay | Admitting: Gastroenterology

## 2023-12-09 NOTE — Progress Notes (Signed)
 Angel [name],  Your bone density test showed that you have decreased bone density (osteopenia), but did not meet criteria for osteoporosis.   It is recommended that you have adequate calcium and vitamin D intake.  Your vitamin D levels were well above the lower threshold of normal.  I would recommend taking between 500-1000mg  of calcium and [phone] units vitamin D daily.  This can be achieved with over the counter supplements, but I can also prescribe them if you prefer.  Regular physical exercise and smoking cessation will also help maintain your bone density  I recommend repeating a bone density scan in 2 years.  [name],  Please place reminder for repeat bone density scan in 2 years

## 2023-12-10 ENCOUNTER — Other Ambulatory Visit: Payer: Self-pay | Admitting: Internal Medicine

## 2023-12-10 ENCOUNTER — Other Ambulatory Visit: Payer: Self-pay

## 2023-12-10 NOTE — Telephone Encounter (Signed)
 Last Fill: 11/29/23 This RN attempted to reach pt to advise there were RF left on script, "call cannot be completed as dialed" message.  Last OV: 11/14/23 ACUTE Next OV: 02/13/24  Routing to provider for review/authorization.

## 2023-12-10 NOTE — Telephone Encounter (Signed)
 Copied from CRM [id number]. Topic: Clinical - Medication Refill >> Dec 10, 2023 10:51 AM Angel [name] wrote: Most Recent Primary Care Visit:  Provider: Jonah Blue B  Department: [facility]  Visit Type: OFFICE VISIT  Date: 11/14/2023  Medication: baclofen (LIORESAL) 10 MG tablet  Has the patient contacted their pharmacy? Yes *See message at the end Is this the correct pharmacy for this prescription? Yes If no, delete pharmacy and type the correct one.  This is the patient's preferred pharmacy:  [facility] [address], [address] [place] [address] Phone: [phone] Fax: [phone]   Has the prescription been filled recently? Yes  Is the patient out of the medication? Yes  Has the patient been seen for an appointment in the last year OR does the patient have an upcoming appointment? Yes  Can we respond through MyChart? Yes  Pt states at her last visit Dr Laural Benes told her to take 2 baclofen (LIORESAL) 10 MG tablet at night instead of 1 like the current Rx reads.  Dr Laural Benes has never fillled this for the pt, but pt states it was discussed at her last appt. Pt has been taking 2 baclofen, and has run out.  Pharmacy will not fill b/c it is too early.

## 2023-12-12 ENCOUNTER — Other Ambulatory Visit: Payer: Self-pay

## 2023-12-22 NOTE — Progress Notes (Unsigned)
 Subjective:    Patient ID: Angel [name], female    DOB: 09/07/1956   MRN: [id number]  HPI  25 yowf smoker  with hx asthma/pna as child never really 100% but no need maintenance rx or speciliast eval then started smoking and did fine until 40's with sob/ intermttent cough started using inhalers referred 03/05/2014 to pulmonary clinic by Angel [name]   with 2nd admit to Angel [name] and abd ct Chest 02/24/14   Admit date: 01/21/2014  Discharge date: 01/25/2014  Recommendations for Outpatient Follow-up:  Recommend CXR to denotes clearing PNa and ensure no lung CA as smoker Continue Delsym for cough Given burst of prednisone + to complete this and levaquin to complete 4/25 Will need PCP follow-up as scheduled Limited Rx vicodin given for Chest pain fro cough/pluerisy Discharge Diagnoses:   COPD exacerbation  HCAP (healthcare-associated pneumonia)  Discharge Condition: good  Diet recommendation: reg  Filed Weights    01/21/14 1306   Weight:  69.6 kg (153 lb 7 oz)   History of present illness:  68 y.o. female came to [name] ed 01/21/2014 with cough. Recent hospitlized 3/25-3/28 wioth COPD exacerbation, compliant with therapy, took azithro/prednisone-readmitted with 3-4 day h/o increasing cough, sputum, fever and mianly SOB  Found on CXR to have HCAP R side in a setting of Prior Strep Pneumonia on Sputum culture from last [name] stay  [name] Course:  HCAP-continue empiric coverage-negative strep pneumo/legionella/sputum cult is still pending however-narrowed to PO levaquin for 7 more days on 4/19 as Strep PNA from last [name] was sensitive to this. No WBC no fever or chills COPD exacerbation-continue Steroids Prednisone 60 mg daily for a 5-7 day burst. continue albuterol nebs q 4 prn-given increasing cough and pain added delsym 30 mg bid. Continue atrovent 0.5 q4 prn. NO O2 req now. OOB to chair and ambulate/shower prn Chest pain probably musculoskeletal. Is already on tramadol for this. Added Vicodin and  encouraged to take NSAIDs as OP in between. Will need CXR in 1 month Consultants:  none Procedures:  none Antibiotics:  Aztreonam 4/16-4/18  Vancomycin 4/16-4/18  Levaquin 4/19-4/23      03/05/2014 1st Angel [name]/ Angel [name]/ advair 100 one  Bid  Chief Complaint  Patient presents with   Pulmonary Consult    Referred per Angel [name] for eval of abnormal ct chest. Pt c/o SOB and cough since Feb 2015. She is SOB climbing stairs. Cough is minimal.   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   Please schedule a follow up office [name] in 6 weeks, call sooner if needed with pfts and cxr > did not return as rec     12/24/2023  Re-establish ov/[name]  resumed smoking re: bronchiectasis    maint on symbicort  80 / had been seeing Angel [name] at [facility]  Chief Complaint  Patient presents with   Consult  Dyspnea:  groceries to house/ [employer] 2-3 ailses slow pace Cough: immediately on lying down non-productive x years then quits p albuterol  Sleeping: once a night waking up coughing x months  min mucoid  SABA use: 2-3 days p exercise  02: none    Lung cancer screening :  CTa 08/25/23  1. No pulmonary embolus. 2. Mild diffuse ground-glass attenuation bilaterally is nonspecific, but can be seen in the setting of pulmonary edema or atypical infection. 3. 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 4. Coronary artery disease. 5.  Aortic Atherosclerosis (ICD10-I70.0).   No obvious day to day or daytime variability or assoc   mucus plugs or hemoptysis or cp or chest tightness,   overt sinus or hb symptoms.    Also denies any obvious fluctuation of symptoms with weather or environmental changes or other aggravating or alleviating factors except as outlined above   No unusual exposure hx or knowledge of premature birth.  Current Allergies, Complete Past Medical History, Past Surgical History, Family History, and Social History were reviewed in [name] record.  ROS  The following are not active complaints unless bolded Hoarseness, sore throat, dysphagia, dental problems, itching, sneezing,  nasal congestion or discharge of excess mucus or purulent secretions, ear ache,   fever, chills, sweats, unintended wt loss or wt gain, classically pleuritic or exertional cp,  orthopnea pnd or arm/hand swelling  or leg swelling, presyncope, palpitations, abdominal pain, anorexia, nausea, vomiting, diarrhea  or change in bowel habits or change in bladder habits, change in stools or change in urine, dysuria, hematuria,  rash, arthralgias, visual complaints, headache, numbness, weakness or ataxia or problems with walking or coordination,  change in mood or  memory.        Current Meds  Medication Sig   albuterol (VENTOLIN HFA) 108 (90 Base) MCG/ACT inhaler Inhale 2 puffs into the lungs every 6 (six) hours as needed for wheezing or shortness of breath.   baclofen (LIORESAL) 10 MG tablet Take 1 tablet (10 mg total) by mouth at bedtime.   budesonide-formoterol (SYMBICORT) 80-4.5 MCG/ACT inhaler Inhale 2 puffs into the lungs 2 (two) times daily.   folic acid (FOLVITE) 1 MG tablet Take 1 tablet (1 mg total) by mouth daily.   furosemide (LASIX) 20 MG tablet Take 1 tablet (20 mg total) by mouth daily. For one week then one daily   gabapentin (NEURONTIN) 100 MG capsule Take 2  capsules (200 mg total) by mouth 2 (two) times daily.   hydrOXYzine (ATARAX) 25 MG tablet Take 1 tablet (25 mg total) by mouth daily as needed for itching.   ipratropium-albuterol (DUONEB) 0.5-2.5 (3) MG/3ML SOLN Take 3 mLs by nebulization every 6 (six) hours as needed.   lactulose (CHRONULAC) 10 GM/15ML solution Take 15 mLs (10 g total) by mouth 2 (two) times daily.   Multiple Vitamins-Minerals (CENTRUM SILVER PO) Take 1 tablet by mouth daily.   omeprazole (PRILOSEC) 20 MG capsule Take 1 capsule (20 mg total) by mouth daily.   OVER THE COUNTER MEDICATION Apply 1 application  topically 2 (two) times daily as needed (joint pain). Tiger Balm PO   spironolactone (ALDACTONE) 50 MG tablet Take 1 tablet (50 mg total) by mouth daily.   thiamine (VITAMIN B1) 100 MG tablet Take 1 tablet (100 mg total) by mouth daily.   Trolamine Salicylate (ASPERCREME EX) Apply 1 Application topically See admin instructions. 1 application 3-4 times  a day as needed for joint pain   Vitamin D, Ergocalciferol, (DRISDOL) 1.25 MG (50000 UNIT) CAPS capsule Take 1 capsule (50,000 Units total) by mouth every 7 (seven) days.               Objective:   Physical Exam  Wts  12/24/2023        168   03/05/14 158 lb (71.668 kg)  02/19/14 161 lb 9.6 oz (73.301 kg)  01/21/14 153 lb 7 oz (69.6 kg)       Vital signs reviewed  12/24/2023  - Note at rest 02 sats  95% on RA   General appearance:    amb wf nad    HEENT : Oropharynx  clear   Nasal turbinates nl    NECK :  without  apparent JVD/ palpable Nodes/TM    LUNGS: no acc muscle use,  Min barrel  contour chest wall with bilateral  slightly decreased bs s audible wheeze and  without cough on insp or exp maneuvers and min  Hyperresonant  to  percussion bilaterally    CV:  RRR  no s3 or murmur or increase in P2, and no edema   ABD:  soft and nontender with pos end  insp Hoover's  in the supine position.  No bruits or organomegaly appreciated   MS:  Nl gait/ ext warm  without deformities Or obvious joint restrictions  calf tenderness, cyanosis or clubbing     SKIN: warm and dry without lesions    NEURO:  alert, approp, nl sensorium with  no motor or cerebellar deficits apparent.              Assessment & Plan:

## 2023-12-23 ENCOUNTER — Other Ambulatory Visit: Payer: Self-pay

## 2023-12-24 ENCOUNTER — Other Ambulatory Visit: Payer: Self-pay

## 2023-12-24 ENCOUNTER — Encounter: Payer: Self-pay | Admitting: Internal Medicine

## 2023-12-24 ENCOUNTER — Ambulatory Visit: Payer: 59 | Admitting: Internal Medicine

## 2023-12-24 ENCOUNTER — Encounter: Payer: Self-pay | Admitting: Gastroenterology

## 2023-12-24 VITALS — BP 126/70 | HR 82 | Temp 98.7°F | Ht 66.0 in | Wt 168.2 lb

## 2023-12-24 DIAGNOSIS — J449 Chronic obstructive pulmonary disease, unspecified: Secondary | ICD-10-CM

## 2023-12-24 DIAGNOSIS — Z87891 Personal history of nicotine dependence: Secondary | ICD-10-CM | POA: Diagnosis not present

## 2023-12-24 DIAGNOSIS — J432 Centrilobular emphysema: Secondary | ICD-10-CM

## 2023-12-24 MED ORDER — BREZTRI AEROSPHERE 160-9-4.8 MCG/ACT IN AERO: 2.0000 | INHALATION_SPRAY | Freq: Two times a day (BID) | RESPIRATORY_TRACT | Status: AC

## 2023-12-24 MED ORDER — PREDNISONE 10 MG PO TABS
ORAL_TABLET | ORAL | 0 refills | Status: AC
  Filled 2023-12-24: qty 14, 6d supply, fill #0

## 2023-12-24 MED ORDER — BREZTRI AEROSPHERE 160-9-4.8 MCG/ACT IN AERO
2.0000 | INHALATION_SPRAY | Freq: Two times a day (BID) | RESPIRATORY_TRACT | 11 refills | Status: DC
  Filled 2023-12-24: qty 10.7, 30d supply, fill #0
  Filled 2024-01-29: qty 10.7, 30d supply, fill #1
  Filled 2024-03-09: qty 10.7, 30d supply, fill #2
  Filled 2024-04-29: qty 10.7, 30d supply, fill #3
  Filled 2024-07-30: qty 10.7, 30d supply, fill #4

## 2023-12-24 MED ORDER — ALBUTEROL SULFATE (2.5 MG/3ML) 0.083% IN NEBU
2.5000 mg | INHALATION_SOLUTION | RESPIRATORY_TRACT | 12 refills | Status: AC | PRN
  Filled 2023-12-24: qty 75, 5d supply, fill #0
  Filled 2024-04-29: qty 75, 5d supply, fill #1
  Filled 2024-10-04: qty 75, 5d supply, fill #2

## 2023-12-24 MED ORDER — CLOTRIMAZOLE 10 MG MT TROC
10.0000 mg | Freq: Every day | OROMUCOSAL | 11 refills | Status: DC
  Filled 2023-12-24: qty 30, 6d supply, fill #0
  Filled 2024-01-20: qty 30, 6d supply, fill #1

## 2023-12-24 NOTE — Patient Instructions (Addendum)
 Prednisone 10 mg take  4 each am x 2 days,   2 each am x 2 days,  1 each am x 2 days and stop   Plan A = Automatic = Always=    Breztri Take 2 puffs first thing in am and then another 2 puffs about 12 hours later.   Work on inhaler technique:  relax and gently blow all the way out then take a nice smooth full deep breath back in, triggering the inhaler at same time you start breathing in.  Hold breath in for at least  5 seconds if you can. Blow out breztri  thru nose. Rinse and gargle with water when done.  If mouth or throat bother you at all,  try brushing teeth/gums/tongue with arm and hammer toothpaste/ make a slurry and gargle and spit out.   Plan B = Backup (to supplement plan A, not to replace it) Only use your albuterol inhaler as a rescue medication to be used if you can't catch your breath by resting or doing a relaxed purse lip breathing pattern.  - The less you use it, the better it will work when you need it. - Ok to use the inhaler up to 2 puffs  every 4 hours if you must but call for appointment if use goes up over your usual need - Don't leave home without it !!  (think of it like the spare tire for your car)   Plan C = Crisis (instead of Plan B but only if Plan B stops working) - only use your albuterol nebulizer if you first try Plan B and it fails to help > ok to use the nebulizer up to every 4 hours but if start needing it regularly call for immediate appointment  Prednisone 10 mg take  4 each am x 2 days,   2 each am x 2 days,  1 each am x 2 days and stop   Clotrimazole lozenge as needed   Please schedule a follow up office visit in 4 weeks, sooner if needed pfts next available

## 2023-12-25 NOTE — Assessment & Plan Note (Addendum)
 Quit smoking  2915  - advair 250 one bid started 03/06/2014  - 12/24/2023  After extensive coaching inhaler device,  effectiveness =    75% > trial of breztri pending return with pfts   DDX of  difficult airways management almost all start with A and  include Adherence, Ace Inhibitors, Acid Reflux, Active Sinus Disease, Alpha 1 Antitripsin deficiency, Anxiety masquerading as Airways dz,  ABPA,  Allergy(esp in young), Aspiration (esp in elderly), Adverse effects of meds,  Active smoking or vaping, A bunch of PE's (a small clot burden can't cause this syndrome unless there is already severe underlying pulm or vascular dz with poor reserve) plus two Bs  = Bronchiectasis and Beta blocker use..and one C= CHF   Adherence is always the initial "prime suspect" and is a multilayered concern that requires a "trust but verify" approach in every patient - starting with knowing how to use medications, especially inhalers, correctly, keeping up with refills and understanding the fundamental difference between maintenance and prns vs those medications only taken for a very short course and then stopped and not refilled.  -see hfa teaching  - return with all meds in hand using a trust but verify approach to confirm accurate Medication  Reconciliation The principal here is that until we are certain that the  patients are doing what we've asked, it makes no sense to ask them to do more.   ? Allergy / asthma components  > high dose ICS / Prednisone 10 mg take  4 each am x 2 days,   2 each am x 2 days,  1 each am x 2 days and stop   ? Alpha one AT > needs pfts and alpha one screening on return   ? Bronchiectais > not seen on last CTa though likely still present if did HRCT   F/u  4 weeks with pfts requested          Each maintenance medication was reviewed in detail including emphasizing most importantly the difference between maintenance and prns and under what circumstances the prns are to be triggered using an  action plan format where appropriate.  Total time for H and P, chart review, counseling, reviewing hfa/neb device(s) and generating customized AVS unique to this office visit / same day charting = 38 min with pt not seen in >  3 y

## 2023-12-30 ENCOUNTER — Other Ambulatory Visit: Payer: Self-pay | Admitting: Gastroenterology

## 2023-12-30 ENCOUNTER — Other Ambulatory Visit: Payer: Self-pay

## 2023-12-30 MED ORDER — SPIRONOLACTONE 50 MG PO TABS
50.0000 mg | ORAL_TABLET | Freq: Every day | ORAL | 2 refills | Status: DC
  Filled 2023-12-30: qty 30, 30d supply, fill #0
  Filled 2024-01-29: qty 30, 30d supply, fill #1
  Filled 2024-03-09: qty 30, 30d supply, fill #2

## 2024-01-01 ENCOUNTER — Other Ambulatory Visit: Payer: Self-pay

## 2024-01-01 ENCOUNTER — Other Ambulatory Visit (INDEPENDENT_AMBULATORY_CARE_PROVIDER_SITE_OTHER)

## 2024-01-01 DIAGNOSIS — K7031 Alcoholic cirrhosis of liver with ascites: Secondary | ICD-10-CM | POA: Diagnosis not present

## 2024-01-01 LAB — COMPREHENSIVE METABOLIC PANEL
ALT: 25 U/L (ref 0–35)
AST: 25 U/L (ref 0–37)
Albumin: 4 g/dL (ref 3.5–5.2)
Alkaline Phosphatase: 151 U/L — ABNORMAL HIGH (ref 39–117)
BUN: 15 mg/dL (ref 6–23)
CO2: 29 meq/L (ref 19–32)
Calcium: 9.4 mg/dL (ref 8.4–10.5)
Chloride: 102 meq/L (ref 96–112)
Creatinine, Ser: 0.96 mg/dL (ref 0.40–1.20)
GFR: 62.15 mL/min (ref >60.00)
Glucose, Bld: 146 mg/dL — ABNORMAL HIGH (ref 70–99)
Potassium: 4.1 meq/L (ref 3.5–5.1)
Sodium: 139 meq/L (ref 135–145)
Total Bilirubin: 0.3 mg/dL (ref 0.2–1.2)
Total Protein: 7.5 g/dL (ref 6.0–8.3)

## 2024-01-01 LAB — GAMMA GT: GGT: 36 U/L (ref 7–51)

## 2024-01-05 LAB — ALKALINE PHOSPHATASE, ISOENZYMES
Alkaline Phosphatase: 178 IU/L — ABNORMAL HIGH (ref 44–121)
BONE FRACTION: 34 % (ref 14–68)
INTESTINAL FRAC.: 29 % — ABNORMAL HIGH (ref 0–18)
LIVER FRACTION: 37 % (ref 18–85)

## 2024-01-06 ENCOUNTER — Other Ambulatory Visit: Payer: Self-pay | Admitting: Gastroenterology

## 2024-01-06 ENCOUNTER — Other Ambulatory Visit: Payer: Self-pay

## 2024-01-06 MED ORDER — FUROSEMIDE 20 MG PO TABS
20.0000 mg | ORAL_TABLET | Freq: Every day | ORAL | 3 refills | Status: DC
  Filled 2024-01-06: qty 30, 30d supply, fill #0
  Filled 2024-02-10: qty 30, 30d supply, fill #1
  Filled 2024-03-09: qty 30, 30d supply, fill #2
  Filled 2024-04-13: qty 30, 30d supply, fill #3

## 2024-01-14 ENCOUNTER — Other Ambulatory Visit: Payer: Self-pay

## 2024-01-20 ENCOUNTER — Other Ambulatory Visit: Payer: Self-pay | Admitting: Gastroenterology

## 2024-01-20 ENCOUNTER — Other Ambulatory Visit: Payer: Self-pay

## 2024-01-20 MED ORDER — LACTULOSE 10 GM/15ML PO SOLN
10.0000 g | Freq: Two times a day (BID) | ORAL | 0 refills | Status: DC
Start: 2024-01-20 — End: 2024-05-12
  Filled 2024-01-20: qty 946, 32d supply, fill #0
  Filled 2024-02-24: qty 946, 32d supply, fill #1
  Filled 2024-03-26: qty 808, 27d supply, fill #2
  Filled 2024-04-13: qty 473, 16d supply, fill #2
  Filled 2024-05-12: qty 473, 16d supply, fill #3

## 2024-01-23 ENCOUNTER — Other Ambulatory Visit: Payer: Self-pay

## 2024-01-29 ENCOUNTER — Other Ambulatory Visit: Payer: Self-pay

## 2024-02-04 ENCOUNTER — Other Ambulatory Visit: Payer: Self-pay

## 2024-02-10 ENCOUNTER — Other Ambulatory Visit: Payer: Self-pay

## 2024-02-10 ENCOUNTER — Other Ambulatory Visit: Payer: Self-pay | Admitting: Internal Medicine

## 2024-02-10 MED ORDER — HYDROXYZINE HCL 25 MG PO TABS
25.0000 mg | ORAL_TABLET | Freq: Every day | ORAL | 0 refills | Status: DC | PRN
  Filled 2024-02-10: qty 90, 90d supply, fill #0

## 2024-02-13 ENCOUNTER — Encounter: Payer: Self-pay | Admitting: Internal Medicine

## 2024-02-13 ENCOUNTER — Other Ambulatory Visit: Payer: Self-pay

## 2024-02-13 ENCOUNTER — Ambulatory Visit: Payer: 59 | Attending: Internal Medicine | Admitting: Internal Medicine

## 2024-02-13 VITALS — BP 118/71 | HR 84 | Temp 97.9°F | Ht 66.0 in | Wt 168.0 lb

## 2024-02-13 DIAGNOSIS — Z9181 History of falling: Secondary | ICD-10-CM | POA: Diagnosis not present

## 2024-02-13 DIAGNOSIS — G629 Polyneuropathy, unspecified: Secondary | ICD-10-CM | POA: Diagnosis not present

## 2024-02-13 DIAGNOSIS — K111 Hypertrophy of salivary gland: Secondary | ICD-10-CM

## 2024-02-13 DIAGNOSIS — J449 Chronic obstructive pulmonary disease, unspecified: Secondary | ICD-10-CM | POA: Diagnosis not present

## 2024-02-13 DIAGNOSIS — F17200 Nicotine dependence, unspecified, uncomplicated: Secondary | ICD-10-CM

## 2024-02-13 DIAGNOSIS — R252 Cramp and spasm: Secondary | ICD-10-CM

## 2024-02-13 DIAGNOSIS — M25561 Pain in right knee: Secondary | ICD-10-CM

## 2024-02-13 DIAGNOSIS — M8580 Other specified disorders of bone density and structure, unspecified site: Secondary | ICD-10-CM | POA: Diagnosis not present

## 2024-02-13 DIAGNOSIS — F17210 Nicotine dependence, cigarettes, uncomplicated: Secondary | ICD-10-CM | POA: Diagnosis not present

## 2024-02-13 DIAGNOSIS — R739 Hyperglycemia, unspecified: Secondary | ICD-10-CM | POA: Diagnosis not present

## 2024-02-13 MED ORDER — GABAPENTIN 100 MG PO CAPS
200.0000 mg | ORAL_CAPSULE | Freq: Two times a day (BID) | ORAL | 3 refills | Status: DC
  Filled 2024-02-13 – 2024-03-09 (×2): qty 120, 30d supply, fill #0
  Filled 2024-04-13: qty 120, 30d supply, fill #1
  Filled 2024-05-12 (×2): qty 120, 30d supply, fill #2
  Filled 2024-06-07: qty 120, 30d supply, fill #3

## 2024-02-13 MED ORDER — BACLOFEN 10 MG PO TABS
10.0000 mg | ORAL_TABLET | Freq: Two times a day (BID) | ORAL | 3 refills | Status: AC
  Filled 2024-02-13: qty 180, 90d supply, fill #0
  Filled 2024-04-29: qty 180, 90d supply, fill #1
  Filled 2024-07-30: qty 180, 90d supply, fill #2
  Filled 2024-11-09: qty 180, 90d supply, fill #3

## 2024-02-13 NOTE — Progress Notes (Signed)
 Patient ID: [name], female    DOB: 1956-05-11  MRN: [id number]  CC: Hypertension (HTN f/u. Med refill /Change baclofen  instruction to twice daily/Pain on bilateral feet - increased falls )   Subjective: Angel [name] is a 68 y.o. female who presents for chronic ds management. Her concerns today include:  Patient with history of HTN, aortic atherosclerosis, tobacco dependence, COPD/bronchiectasis, EtOH cirrhosis, HL, vitamin D  deficiency, malnutrition, osteopenia, strong fhx of RA   Discussed the use of AI scribe software for clinical note transcription with the patient, who gave verbal consent to proceed.  History of Present Illness Angel [name] "Angel [name]" is a 68 year old female with neuropathy and cirrhosis who presents with worsening cramps and difficulty walking.  She experiences worsening cramps and difficulty walking due to neuropathy. Baclofen  has been increased to twice daily, providing relief for the cramps. Gabapentin  offers some relief, yet she continues to feel a burning sensation in her feet and unsteadiness, fearing falls. She recalls a fall two days before Angel [name] Patrick's Day, where she felt her legs buckle when standing up from the chair.  She would like to know what causes her neuropathy.  She is not diabetic.  She had history of EtOH use disorder.  B12 levels have been normal.  She had mild elevation in blood sugar on last chemistry done.  Had recent bone density study showing osteopenia.  She takes calcium  and high-dose vitamin D  for osteopenia, with her vitamin D  levels recently found to be high normal in the 60s.  Right knee pain started about a month ago, feeling like it might give out or slip backwards, especially when turning. There is no significant swelling, but she experiences pain and occasional clicking in the knee.  No known injury.  She has cirrhosis with prominent veins in the lower legs and is on furosemide  and spironolactone , with no current swelling in  her legs. She has a history of ascites, which was previously drained but is not currently present.  She has COPD and uses Breztri  daily and albuterol  as needed, with a nebulizer solution changed to as-needed use. She smokes two to three cigarettes a day, despite efforts to quit, and uses popsicles to help manage cravings.  Her gastroenterologist is not concerned about recurrent cervical adenopathy.  Patient states that previous PCP Dr. Brent Cambric had noted the same.  Denies any fever/night sweats or pain.  Patient states it is under the chin on both sides and the swelling comes and goes.  Currently swollen.    Patient Active Problem List   Diagnosis Date Noted   Moderately severe major depression (HCC) 10/14/2023   Benign disease of right breast 11/06/2022   Aortic atherosclerosis (HCC) 04/19/2022   Vitamin D  deficiency 04/19/2022   Iron overload 04/19/2022   Cirrhosis (HCC) 04/12/2022   Malnutrition of moderate degree 04/12/2022   HTN (hypertension) 04/11/2022   HLD (hyperlipidemia) 04/11/2022   Hypoalbuminemia 04/11/2022   Alcoholic cirrhosis of liver with ascites (HCC) 04/11/2022   Bronchiectasis without acute exacerbation (HCC) 03/06/2014   COPD (chronic obstructive pulmonary disease) (HCC) 02/19/2014   Environmental allergies 02/19/2014   Cigarette smoker 12/31/2013     Current Outpatient Medications on File Prior to Visit  Medication Sig Dispense Refill   albuterol  (PROVENTIL ) (2.5 MG/3ML) 0.083% nebulizer solution Take 3 mLs (2.5 mg total) by nebulization every 4 (four) hours as needed. 75 mL 12   albuterol  (VENTOLIN  HFA) 108 (90 Base) MCG/ACT inhaler Inhale 2 puffs into the lungs  every 6 (six) hours as needed for wheezing or shortness of breath. 54 g 1   budeson-glycopyrrolate -formoterol  (BREZTRI  AEROSPHERE) 160-9-4.8 MCG/ACT AERO Inhale 2 puffs into the lungs 2 (two) times daily. 10.7 g 11   budeson-glycopyrrolate -formoterol  (BREZTRI  AEROSPHERE) 160-9-4.8 MCG/ACT AERO Inhale 2  puffs into the lungs in the morning and at bedtime.     clotrimazole  (MYCELEX ) 10 MG troche Take 1 tablet (10 mg total) by mouth 5 (five) times daily. 30 Troche 11   folic acid  (FOLVITE ) 1 MG tablet Take 1 tablet (1 mg total) by mouth daily. 90 tablet 1   furosemide  (LASIX ) 20 MG tablet Take 1 tablet (20 mg total) by mouth daily. 30 tablet 3   hydrOXYzine  (ATARAX ) 25 MG tablet Take 1 tablet (25 mg total) by mouth daily as needed for itching. 90 tablet 0   lactulose  (CHRONULAC ) 10 GM/15ML solution Take 15 mLs (10 g total) by mouth 2 (two) times daily. 2700 mL 0   Multiple Vitamins-Minerals (CENTRUM SILVER PO) Take 1 tablet by mouth daily.     omeprazole  (PRILOSEC) 20 MG capsule Take 1 capsule (20 mg total) by mouth daily. 60 capsule 2   OVER THE COUNTER MEDICATION Apply 1 application  topically 2 (two) times daily as needed (joint pain). Tiger Balm PO     spironolactone  (ALDACTONE ) 50 MG tablet Take 1 tablet (50 mg total) by mouth daily. 30 tablet 2   thiamine  (VITAMIN B1) 100 MG tablet Take 1 tablet (100 mg total) by mouth daily. 100 tablet 0   Trolamine Salicylate (ASPERCREME EX) Apply 1 Application topically See admin instructions. 1 application 3-4 times a day as needed for joint pain     Vitamin D , Ergocalciferol , (DRISDOL ) 1.25 MG (50000 UNIT) CAPS capsule Take 1 capsule (50,000 Units total) by mouth every 7 (seven) days. 12 capsule 5   No current facility-administered medications on file prior to visit.    Allergies  Allergen Reactions   Penicillins Hives    Social History   Socioeconomic History   Marital status: Single    Spouse name: Not on file   Number of children: 0   Years of education: Not on file   Highest education level: Associate degree: academic program  Occupational History   Occupation: Unemployed  Tobacco Use   Smoking status: Former    Current packs/day: 0.00    Average packs/day: 0.5 packs/day for 30.0 years (15.0 ttl pk-yrs)    Types: Cigarettes    Start  date: 12/31/1983    Quit date: 12/30/2013    Years since quitting: 10.1   Smokeless tobacco: Never  Vaping Use   Vaping status: Never Used  Substance and Sexual Activity   Alcohol use: Yes    Alcohol/week: 28.0 standard drinks of alcohol    Types: 28 Glasses of wine per week   Drug use: Yes    Frequency: 3.0 times per week    Types: Marijuana   Sexual activity: Not Currently  Other Topics Concern   Not on file  Social History Narrative   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    Never pregnant, NO husband   Social drinkier    Quite smoking 3/15   Social Drivers of Corporate investment banker Strain: Low Risk  (11/14/2023)   Overall Financial Resource Strain (CARDIA)    Difficulty of Paying Living Expenses: Not very hard  Food Insecurity: No Food Insecurity (  11/14/2023)   Hunger Vital Sign    Worried About Running Out of Food in the Last Year: Never true    Ran Out of Food in the Last Year: Never true  Recent Concern: Food Insecurity - Food Insecurity Present (10/12/2023)   Hunger Vital Sign    Worried About Running Out of Food in the Last Year: Often true    Ran Out of Food in the Last Year: Sometimes true  Transportation Needs: No Transportation Needs (11/14/2023)   PRAPARE - Administrator, Civil Service (Medical): No    Lack of Transportation (Non-Medical): No  Physical Activity: Inactive (11/14/2023)   Exercise Vital Sign    Days of Exercise per Week: 0 days    Minutes of Exercise per Session: 0 min  Stress: Stress Concern Present (11/14/2023)   [employer] of Occupational Health - Occupational Stress Questionnaire    Feeling of Stress : Rather much  Social Connections: Moderately Isolated (10/14/2023)   Social Connection and Isolation Panel [NHANES]    Frequency of Communication with Friends and Family: More than three times a week    Frequency of Social Gatherings with Friends and Family:  Twice a week    Attends Religious Services: Never    Database administrator or Organizations: Yes    Attends Banker Meetings: Never    Marital Status: Divorced  Catering manager Violence: Not At Risk (11/14/2023)   Humiliation, Afraid, Rape, and Kick questionnaire    Fear of Current or Ex-Partner: No    Emotionally Abused: No    Physically Abused: No    Sexually Abused: No    Family History  Problem Relation Age of Onset   Breast cancer Mother    Colon cancer Mother    CAD Father    Heart disease Father    Breast cancer Sister    Emphysema Paternal Grandfather        never smoker, Theatre stage manager   Cancer Cousin        blood cancer, unknown type   Liver disease Neg Hx    Esophageal cancer Neg Hx     Past Surgical History:  Procedure Laterality Date   ABDOMINAL HYSTERECTOMY     APPENDECTOMY     BACK SURGERY      ROS: Review of Systems Negative except as stated above  PHYSICAL EXAM: BP 118/71 (BP Location: Left Arm, Patient Position: Sitting, Cuff Size: Normal)   Pulse 84   Temp 97.9 F (36.6 C) (Oral)   Ht 5\' 6"  (1.676 m)   Wt 168 lb (76.2 kg)   SpO2 96%   BMI 27.12 kg/m   Physical Exam  General appearance - alert, well appearing, and in no distress Mental status - normal mood, behavior, speech, dress, motor activity, and thought processes Neck -no cervical lymphadenopathy appreciated.  No thyromegaly or nodules.  She has fairly large submandibular glands on both sides and appear equal in size.  Nontender to touch. Chest -mild scattered wheezes Heart - normal rate, regular rhythm, normal S1, S2, no murmurs, rubs, clicks or gallops Abdomen - soft, nontender, nondistended, no masses or organomegaly Extremities -no lower extremity edema.  Veins in the legs are quite prominent.  She has some wasting of muscles in the lower third of the legs. MSK: Right knee slightly large joint.  No point tenderness.  She has good range of motion with some crepitus  appreciated.  Gait is stable.     Latest Ref Rng &  Units 01/01/2024    4:30 PM 11/22/2023    4:01 PM 11/14/2023   12:17 PM  CMP  Glucose 70 - 99 mg/dL 629   528   BUN 6 - 23 mg/dL 15   27   Creatinine 4.13 - 1.20 mg/dL 2.44   0.10   Sodium 272 - 145 mEq/L 139   139   Potassium 3.5 - 5.1 mEq/L 4.1   5.0   Chloride 96 - 112 mEq/L 102   102   CO2 19 - 32 mEq/L 29   24   Calcium  8.4 - 10.5 mg/dL 9.4   9.8   Total Protein 6.0 - 8.3 g/dL 7.5  7.6    Total Bilirubin 0.2 - 1.2 mg/dL 0.3  0.4    Alkaline Phos 44 - 121 IU/L 39 - 117 U/L 178    151  146    AST 0 - 37 U/L 25  21    ALT 0 - 35 U/L 25  16     Lipid Panel     Component Value Date/Time   CHOL 121 04/19/2022 1207   TRIG 62 04/19/2022 1207   HDL 36 (L) 04/19/2022 1207   CHOLHDL 3.4 04/19/2022 1207   CHOLHDL 4.5 02/19/2014 1226   VLDL NOT CALC 02/19/2014 1226   LDLCALC 72 04/19/2022 1207    CBC    Component Value Date/Time   WBC 13.6 (H) 08/25/2023 1214   RBC 5.22 (H) 08/25/2023 1214   HGB 15.8 (H) 08/25/2023 1214   HGB 15.7 06/04/2023 1411   HCT 47.7 (H) 08/25/2023 1214   HCT 45.1 06/04/2023 1411   PLT 190 08/25/2023 1214   PLT 208 06/04/2023 1411   MCV 91.4 08/25/2023 1214   MCV 87 06/04/2023 1411   MCH 30.3 08/25/2023 1214   MCHC 33.1 08/25/2023 1214   RDW 13.2 08/25/2023 1214   RDW 13.2 06/04/2023 1411   LYMPHSABS 2.5 06/04/2023 1411   MONOABS 0.7 08/06/2022 1735   EOSABS 0.1 06/04/2023 1411   BASOSABS 0.1 06/04/2023 1411    ASSESSMENT AND PLAN: 1. Peripheral polyneuropathy (Primary) Continue gabapentin .  I suspect this is due to history of prior EtOH use disorder.  She would like to see a neurologist. - gabapentin  (NEURONTIN ) 100 MG capsule; Take 2 capsules (200 mg total) by mouth 2 (two) times daily.  Dispense: 120 capsule; Refill: 3 - Ambulatory referral to Neurology - Basic Metabolic Panel  2. Muscle cramps She will continue baclofen .  I have increased the dose to 10 mg twice a day as was discussed  on previous visit. - baclofen  (LIORESAL ) 10 MG tablet; Take 1 tablet (10 mg total) by mouth 2 (two) times daily.  Dispense: 180 each; Refill: 3  3. Acute pain of right knee - AMB referral to orthopedics  4. History of recent fall Gait overall seems stable and unassisted.  I recommend referral for some physical therapy for gait safety training but patient declined at this time.  5. Osteopenia, unspecified location Advised to decrease the once weekly high dose vitamin D  2 once every other week.  Advised to take calcium  supplement 600 mg twice a day.  Purchase over-the-counter.  6. Chronic obstructive pulmonary disease, unspecified COPD type (HCC) Continue Breztri  and albuterol  as prescribed by her pulmonologist Dr. Waymond Hailey.  She has an upcoming appointment for pulmonary function test.  7. Tobacco dependence Strongly advised to quit smoking.  8. Enlargement of submandibular gland I suspect this enlargement may be due to history of  alcohol use.  However we will get CAT scan to evaluate further.  I do not appreciate any lymphadenopathy. - CT Soft Tissue Neck W Contrast; Future  9. Hyperglycemia - Hemoglobin A1c - Basic Metabolic Panel  Patient was given the opportunity to ask questions.  Patient verbalized understanding of the plan and was able to repeat key elements of the plan.   This documentation was completed using Paediatric nurse.  Any transcriptional errors are unintentional.  Orders Placed This Encounter  Procedures   CT Soft Tissue Neck W Contrast   Hemoglobin A1c   Basic Metabolic Panel   Ambulatory referral to Neurology   AMB referral to orthopedics     Requested Prescriptions   Signed Prescriptions Disp Refills   gabapentin  (NEURONTIN ) 100 MG capsule 120 capsule 3    Sig: Take 2 capsules (200 mg total) by mouth 2 (two) times daily.   baclofen  (LIORESAL ) 10 MG tablet 180 each 3    Sig: Take 1 tablet (10 mg total) by mouth 2 (two) times daily.     Return in about 4 months (around 06/15/2024).  Concetta Dee, MD, FACP

## 2024-02-13 NOTE — Patient Instructions (Signed)
 VISIT SUMMARY:  During your visit, we discussed your worsening cramps and difficulty walking due to neuropathy, right knee pain, and your ongoing management of cirrhosis, COPD, and osteopenia. We also reviewed your smoking habits and the steps you are taking to quit.  YOUR PLAN:  -CIRRHOSIS OF LIVER: Cirrhosis is a condition where the liver is scarred and permanently damaged. We will order a CT scan of your neck to evaluate your soft tissue and salivary glands due to your history of ascites and possible enlarged salivary glands.  -PERIPHERAL NEUROPATHY: Peripheral neuropathy is a condition that results from damage to the nerves outside of the brain and spinal cord, often causing weakness, numbness, and pain. We will refer you to a neurologist for further evaluation and consider physical therapy to help with your balance and prevent falls.  -CHRONIC OBSTRUCTIVE PULMONARY DISEASE (COPD): COPD is a chronic inflammatory lung disease that causes obstructed airflow from the lungs. You are managing this with Breztri  and albuterol , and we have changed your nebulizer solution to as-needed use. It is important to continue efforts to quit smoking as it can worsen your symptoms.  -TOBACCO USE: Smoking can cause a variety of health issues and worsen existing conditions like COPD. We encourage you to continue your efforts to quit smoking.  -OSTEOPENIA: Osteopenia is a condition where bone mineral density is lower than normal, which can lead to fractures. We will reduce your vitamin D  supplementation to twice a month since your recent levels were high normal. Take Calcium  600 mg twice a day.   -KNEE PAIN: Your right knee pain and instability may be due to various causes. We will refer you to an orthopedic specialist for a thorough evaluation.  INSTRUCTIONS:  Please follow up with the neurologist for your neuropathy evaluation and with the orthopedic specialist for your knee pain. Additionally, schedule a CT  scan of your neck as discussed. Continue your efforts to quit smoking and reduce your vitamin D  supplementation to twice a month.

## 2024-02-14 ENCOUNTER — Telehealth: Payer: Self-pay | Admitting: Gastroenterology

## 2024-02-14 ENCOUNTER — Encounter: Payer: Self-pay | Admitting: Internal Medicine

## 2024-02-14 LAB — HEMOGLOBIN A1C
Est. average glucose Bld gHb Est-mCnc: 151 mg/dL
Hgb A1c MFr Bld: 6.9 % — ABNORMAL HIGH (ref 4.8–5.6)

## 2024-02-14 LAB — BASIC METABOLIC PANEL WITH GFR
BUN/Creatinine Ratio: 18 (ref 12–28)
BUN: 17 mg/dL (ref 8–27)
CO2: 24 mmol/L (ref 20–29)
Calcium: 9.9 mg/dL (ref 8.7–10.3)
Chloride: 100 mmol/L (ref 96–106)
Creatinine, Ser: 0.95 mg/dL (ref 0.57–1.00)
Glucose: 101 mg/dL — ABNORMAL HIGH (ref 70–99)
Potassium: 4.5 mmol/L (ref 3.5–5.2)
Sodium: 141 mmol/L (ref 134–144)
eGFR: 66 mL/min/{1.73_m2} (ref >59)

## 2024-02-14 NOTE — Telephone Encounter (Signed)
 Pt states she saw her PCP and had some blood drawn and she has been diagnosed with diabetes. States Dr Cherryl Corona did not want to see her back until she had been cleared by pulmonary. She is having a lung function test in June. Pt wants to know if she needs to have any liver labs checked. Please advise.

## 2024-02-14 NOTE — Telephone Encounter (Signed)
 Inbound call from patient requesting a call to discuss lab work. Patient is unsure if she needs to have labs done. States she recently saw her pcp and was advised to have liver tests done. Requesting a call back. Please advise, thank you.

## 2024-02-17 ENCOUNTER — Telehealth: Payer: Self-pay

## 2024-02-17 NOTE — Telephone Encounter (Signed)
 Copied from CRM [id number]. Topic: Appointments - Scheduling Inquiry for Clinic >> Feb 14, 2024  3:11 PM Ilene Malling wrote: Reason for CRM: Patient [phone] wants to know if she needs to see Dr. Waymond Hailey on 02/19/24 or just wait for 03/19/24 appointment. Patient was under the impression Dr. Waymond Hailey wanted to see her after having PFT done. Please advise and call back. Also, patient wanted Dr. Waymond Hailey to know patient is doing fine.  Spoke with Illiana. Appt for 5/14 has been cancelled since PFT will not be completed then. Pt is aware and is still scheduled for 6/12 for PFT and OV to follow. Nfn

## 2024-02-17 NOTE — Progress Notes (Deleted)
 Subjective:    Patient ID: Angel [name], female    DOB: 1956-06-27   MRN: [id number]  HPI  24 yowf smoker  with hx asthma/pna as child never really 100% but no need maintenance rx or speciliast eval then started smoking and did fine until 40's with sob/ intermttent cough started using inhalers referred 03/05/2014 to pulmonary clinic by Dr Glenis Langdon   with 2nd admit to [facility] and abd ct Chest 02/24/14   Admit date: 01/21/2014  Discharge date: 01/25/2014  Recommendations for Outpatient Follow-up:  Recommend CXR to denotes clearing PNa and ensure no lung CA as smoker Continue Delsym  for cough Given burst of prednisone  + to complete this and levaquin  to complete 4/25 Will need PCP follow-up as scheduled Limited Rx vicodin given for Chest pain fro cough/pluerisy Discharge Diagnoses:   COPD exacerbation  HCAP (healthcare-associated pneumonia)  Discharge Condition: good  Diet recommendation: reg  Filed Weights    01/21/14 1306   Weight:  69.6 kg (153 lb 7 oz)   History of present illness:  68 y.o. female came [facility] ed 01/21/2014 with cough. Recent hospitlized 3/25-3/28 wioth COPD exacerbation, compliant with therapy, took azithro/prednisone -readmitted with 3-4 day h/o increasing cough, sputum, fever and mianly SOB  Found on CXR to have HCAP R side in a setting of Prior Strep Pneumonia on Sputum culture from last hospital stay  Hospital Course:  HCAP-continue empiric coverage-negative strep pneumo/legionella/sputum cult is still pending however-narrowed to PO levaquin  for 7 more days on 4/19 as Strep PNA from last visit was sensitive to this. No WBC no fever or chills COPD exacerbation-continue Steroids Prednisone  60 mg daily for a 5-7 day burst. continue albuterol  nebs q 4 prn-given increasing cough and pain added delsym  30 mg bid. Continue atrovent  0.5 q4 prn. NO O2 req now. OOB to chair and ambulate/shower prn Chest pain probably musculoskeletal. Is already on tramadol  for this. Added Vicodin and  encouraged to take NSAIDs as OP in between. Will need CXR in 1 month Consultants:  none Procedures:  none Antibiotics:  Aztreonam  4/16-4/18  Vancomycin  4/16-4/18  Levaquin  4/19-4/23      03/05/2014 1st [facility] office visit/ [name]/ advair 100 one  Bid  Chief Complaint  Patient presents with   Pulmonary Consult    Referred per Dr. Deepak Avandi for eval of abnormal ct chest. Pt c/o SOB and cough since Feb 2015. She is SOB climbing stairs. Cough is minimal.   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   Please schedule a follow up office visit in 6 weeks, call sooner if needed with pfts and cxr > did not return as rec     12/24/2023  [name]  resumed smoking re: bronchiectasis    maint on symbicort   80 / had been seeing Dr Brent Cambric at [facility]  Chief Complaint  Patient presents with   Consult  Dyspnea:  groceries to house/ [employer] 2-3 ailses slow pace Cough: immediately on lying down non-productive x years then quits p albuterol   Sleeping: once a night waking up coughing x months  min mucoid  SABA use: 2-3 days p exercise  02: none  Rec   02/19/2024  f/u ov/[name] re: ***   maint on ***  No chief complaint on file.   Dyspnea:  *** Cough: *** Sleeping: *** resp cc  SABA use: *** 02: ***  Lung cancer screening :  ***    No obvious day to day or daytime variability or assoc excess/  purulent sputum or mucus plugs or hemoptysis or cp or chest tightness, subjective wheeze or overt sinus or hb symptoms.    Also denies any obvious fluctuation of symptoms with weather or environmental changes or other aggravating or alleviating factors except as outlined above   No unusual exposure hx or h/o childhood pna/ asthma or knowledge of premature birth.  Current Allergies, Complete Past Medical History, Past Surgical History, Family History, and Social History were reviewed in [employer] record.  ROS  The following are not active complaints unless bolded Hoarseness, sore throat, dysphagia, dental problems, itching, sneezing,  nasal congestion or discharge of excess mucus or purulent secretions, ear ache,   fever, chills, sweats, unintended wt loss or wt gain, classically pleuritic or exertional cp,  orthopnea pnd or arm/hand swelling  or leg swelling, presyncope, palpitations, abdominal pain, anorexia, nausea, vomiting, diarrhea  or change in bowel habits or change in bladder habits, change in stools or change in urine, dysuria, hematuria,  rash, arthralgias, visual complaints, headache, numbness, weakness or ataxia or problems with walking or coordination,  change in mood or  memory.        No outpatient medications have been marked as taking for the 02/19/24 encounter (Appointment) with Diamond Formica, MD.                  Objective:   Physical Exam  Wts  02/19/2024         **** 12/24/2023        168   03/05/14 158 lb (71.668 kg)  02/19/14 161 lb 9.6 oz (73.301 kg)  01/21/14 153 lb 7 oz (69.6 kg)      Vital signs reviewed  02/19/2024  - Note at rest 02 sats  ***% on ***   General appearance:    ***    Min barr ***    Lung cancer screening :   CTa 08/25/23  1. No pulmonary embolus. 2. Mild diffuse ground-glass attenuation bilaterally is nonspecific, but can be seen in the setting of pulmonary edema or atypical infection. 3. Nodular  appearance of the liver suggests cirrhosis. 4. Coronary artery disease. 5.  Aortic Atherosclerosis (ICD10-I70.0).    Assessment & Plan:

## 2024-02-18 ENCOUNTER — Ambulatory Visit
Admission: RE | Admit: 2024-02-18 | Discharge: 2024-02-18 | Disposition: A | Discharge status: Home / Self Care | Source: Ambulatory Visit | Attending: Internal Medicine | Admitting: Internal Medicine

## 2024-02-18 DIAGNOSIS — R221 Localized swelling, mass and lump, neck: Secondary | ICD-10-CM | POA: Diagnosis not present

## 2024-02-18 DIAGNOSIS — K111 Hypertrophy of salivary gland: Secondary | ICD-10-CM

## 2024-02-18 MED ORDER — IOPAMIDOL (ISOVUE-300) INJECTION 61%
75.0000 mL | Freq: Once | INTRAVENOUS | Status: AC | PRN
  Administered 2024-02-18: 75 mL via INTRAVENOUS

## 2024-02-19 ENCOUNTER — Ambulatory Visit: Admitting: Internal Medicine

## 2024-02-20 NOTE — Telephone Encounter (Signed)
 Spoke with pt and she is aware of results and recommendations per Dr Cherryl Corona. Letter sent to Dr Waymond Hailey to request pulmonary clearance for egd/colon.

## 2024-02-20 NOTE — Telephone Encounter (Signed)
 Letter sent to Dr. Waymond Hailey for pulmonary clearance.

## 2024-02-21 ENCOUNTER — Ambulatory Visit: Attending: Internal Medicine | Admitting: Internal Medicine

## 2024-02-21 ENCOUNTER — Encounter: Payer: Self-pay | Admitting: Internal Medicine

## 2024-02-21 ENCOUNTER — Other Ambulatory Visit: Payer: Self-pay

## 2024-02-21 ENCOUNTER — Telehealth: Admitting: Internal Medicine

## 2024-02-21 DIAGNOSIS — Z7984 Long term (current) use of oral hypoglycemic drugs: Secondary | ICD-10-CM

## 2024-02-21 DIAGNOSIS — E119 Type 2 diabetes mellitus without complications: Secondary | ICD-10-CM | POA: Diagnosis not present

## 2024-02-21 MED ORDER — ACCU-CHEK SOFTCLIX LANCETS MISC
12 refills | Status: DC
  Filled 2024-02-21: qty 100, 100d supply, fill #0

## 2024-02-21 MED ORDER — ACCU-CHEK GUIDE W/DEVICE KIT
PACK | 0 refills | Status: DC
  Filled 2024-02-21: qty 1, 30d supply, fill #0

## 2024-02-21 MED ORDER — METFORMIN HCL 500 MG PO TABS
500.0000 mg | ORAL_TABLET | Freq: Every day | ORAL | 1 refills | Status: DC
Start: 2024-02-21 — End: 2024-05-08
  Filled 2024-02-21: qty 90, 90d supply, fill #0

## 2024-02-21 MED ORDER — ACCU-CHEK GUIDE TEST VI STRP
ORAL_STRIP | 12 refills | Status: DC
  Filled 2024-02-21: qty 100, 100d supply, fill #0

## 2024-02-21 NOTE — Progress Notes (Signed)
 Patient ID: Angel [name], female   DOB: Nov 10, 1955, 68 y.o.   MRN: [id number] Virtual Visit via Video Note  I connected with Angel [name] on 02/21/2024 at 11:18 AM by a video enabled telemedicine application and verified that I am speaking with the correct person using two identifiers.  Location: Patient: home Provider: Office   I discussed the limitations of evaluation and management by telemedicine and the availability of in person appointments. The patient expressed understanding and agreed to proceed.  History of Present Illness: Patient with history of HTN, aortic atherosclerosis, tobacco dependence, COPD/bronchiectasis, EtOH cirrhosis, HL, vitamin D  deficiency, malnutrition, osteopenia, strong fhx of RA   Discussed the use of AI scribe software for clinical note transcription with the patient, who gave verbal consent to proceed.  History of Present Illness Angel [name] "[name]" is a 68 year old female who presents for follow-up on her recent lab results indicating newly diagnosed type 2 diabetes.  Her recent blood tests show an A1c level of 6.9%, confirming type 2 diabetes. She experiences neuropathy in her hands and feet. She is not currently on any diabetes medication. Her family history includes diabetes on her mother's side, affecting her grandmother, uncles, and aunts.    Outpatient Encounter Medications as of 02/21/2024  Medication Sig   albuterol  (PROVENTIL ) (2.5 MG/3ML) 0.083% nebulizer solution Take 3 mLs (2.5 mg total) by nebulization every 4 (four) hours as needed.   albuterol  (VENTOLIN  HFA) 108 (90 Base) MCG/ACT inhaler Inhale 2 puffs into the lungs every 6 (six) hours as needed for wheezing or shortness of breath.   baclofen  (LIORESAL ) 10 MG tablet Take 1 tablet (10 mg total) by mouth 2 (two) times daily.   budeson-glycopyrrolate -formoterol  (BREZTRI  AEROSPHERE) 160-9-4.8 MCG/ACT AERO Inhale 2 puffs into the lungs 2 (two) times daily.   budeson-glycopyrrolate -formoterol   (BREZTRI  AEROSPHERE) 160-9-4.8 MCG/ACT AERO Inhale 2 puffs into the lungs in the morning and at bedtime.   clotrimazole  (MYCELEX ) 10 MG troche Take 1 tablet (10 mg total) by mouth 5 (five) times daily.   folic acid  (FOLVITE ) 1 MG tablet Take 1 tablet (1 mg total) by mouth daily.   furosemide  (LASIX ) 20 MG tablet Take 1 tablet (20 mg total) by mouth daily.   gabapentin  (NEURONTIN ) 100 MG capsule Take 2 capsules (200 mg total) by mouth 2 (two) times daily.   hydrOXYzine  (ATARAX ) 25 MG tablet Take 1 tablet (25 mg total) by mouth daily as needed for itching.   lactulose  (CHRONULAC ) 10 GM/15ML solution Take 15 mLs (10 g total) by mouth 2 (two) times daily.   Multiple Vitamins-Minerals (CENTRUM SILVER PO) Take 1 tablet by mouth daily.   omeprazole  (PRILOSEC) 20 MG capsule Take 1 capsule (20 mg total) by mouth daily.   OVER THE COUNTER MEDICATION Apply 1 application  topically 2 (two) times daily as needed (joint pain). Tiger Balm PO   spironolactone  (ALDACTONE ) 50 MG tablet Take 1 tablet (50 mg total) by mouth daily.   thiamine  (VITAMIN B1) 100 MG tablet Take 1 tablet (100 mg total) by mouth daily.   Trolamine Salicylate (ASPERCREME EX) Apply 1 Application topically See admin instructions. 1 application 3-4 times a day as needed for joint pain   Vitamin D , Ergocalciferol , (DRISDOL ) 1.25 MG (50000 UNIT) CAPS capsule Take 1 capsule (50,000 Units total) by mouth every 7 (seven) days.   No facility-administered encounter medications on file as of 02/21/2024.      Observations/Objective: Older Caucasian female sitting in chair in NAD  Results  for orders placed or performed in visit on 02/13/24  Hemoglobin A1c   Collection Time: 02/13/24  2:26 PM  Result Value Ref Range   Hgb A1c MFr Bld 6.9 (H) 4.8 - 5.6 %   Est. average glucose Bld gHb Est-mCnc 151 mg/dL  Basic Metabolic Panel   Collection Time: 02/13/24  2:26 PM  Result Value Ref Range   Glucose 101 (H) 70 - 99 mg/dL   BUN 17 8 - 27 mg/dL    Creatinine, Ser 7.25 0.57 - 1.00 mg/dL   eGFR 66 >36 UY/QIH/4.74   BUN/Creatinine Ratio 18 12 - 28   Sodium 141 134 - 144 mmol/L   Potassium 4.5 3.5 - 5.2 mmol/L   Chloride 100 96 - 106 mmol/L   CO2 24 20 - 29 mmol/L   Calcium  9.9 8.7 - 10.3 mg/dL     Assessment and Plan: 1. New onset type 2 diabetes mellitus (HCC) (Primary) Type 2 diabetes mellitus New diagnosis with A1c of 6.9%. Discussed insulin resistance, potential complications, and importance of lifestyle modifications and glycemic control. Explained metformin GI side effects and mitigation strategies. - Prescribe metformin 500 mg once daily. - Educate on dietary modifications: eliminate sugary drinks, incorporate fresh fruits and vegetables, choose lean meats, and opt for whole grain bread. - Advise on portion control for carbohydrates such as rice and potatoes. - Order glucose monitor for home blood sugar monitoring. - Set blood sugar goals: 90-130 mg/dL before meals, less than 180 mg/dL two hours after meals. - Schedule annual eye exam to monitor for diabetic retinopathy. - Monitor kidney function regularly.  - metFORMIN (GLUCOPHAGE) 500 MG tablet; Take 1 tablet (500 mg total) by mouth daily with breakfast.  Dispense: 90 tablet; Refill: 1 - Blood Glucose Monitoring Suppl (ACCU-CHEK GUIDE) w/Device KIT; Use as directed to check blood sugars once a day.  Dispense: 1 kit; Refill: 0 - Accu-Chek Softclix Lancets lancets; Use as directed to check blood sugars once a day.  Dispense: 100 each; Refill: 12 - glucose blood (ACCU-CHEK GUIDE TEST) test strip; Use as directed to check blood sugars once a day.  Dispense: 100 each; Refill: 12   Follow Up Instructions: Keep appt for 06/2024 as scheduled   I discussed the assessment and treatment plan with the patient. The patient was provided an opportunity to ask questions and all were answered. The patient agreed with the plan and demonstrated an understanding of the instructions.   The  patient was advised to call back or seek an in-person evaluation if the symptoms worsen or if the condition fails to improve as anticipated.  I spent 16 minutes dedicated to the care of this patient on the date of this encounter to include previsit review of of my last note and lab test results, face-to-face time with patient discussing diagnosis and management and post visit entering of orders.  This note has been created with Education officer, environmental. Any transcriptional errors are unintentional.  Concetta Dee, MD

## 2024-02-24 ENCOUNTER — Other Ambulatory Visit: Payer: Self-pay | Admitting: Critical Care Medicine

## 2024-02-25 ENCOUNTER — Encounter: Payer: Self-pay | Admitting: Physician Assistant

## 2024-02-25 ENCOUNTER — Other Ambulatory Visit (INDEPENDENT_AMBULATORY_CARE_PROVIDER_SITE_OTHER)

## 2024-02-25 ENCOUNTER — Other Ambulatory Visit: Payer: Self-pay

## 2024-02-25 ENCOUNTER — Ambulatory Visit (INDEPENDENT_AMBULATORY_CARE_PROVIDER_SITE_OTHER): Admitting: Physician Assistant

## 2024-02-25 DIAGNOSIS — M1711 Unilateral primary osteoarthritis, right knee: Secondary | ICD-10-CM

## 2024-02-25 MED ORDER — THIAMINE HCL 100 MG PO TABS
100.0000 mg | ORAL_TABLET | Freq: Every day | ORAL | 0 refills | Status: DC
  Filled 2024-02-25: qty 100, 100d supply, fill #0

## 2024-02-25 MED ORDER — LIDOCAINE HCL 1 % IJ SOLN
2.0000 mL | INTRAMUSCULAR | Status: AC | PRN
  Administered 2024-02-25: 2 mL

## 2024-02-25 MED ORDER — BUPIVACAINE HCL 0.25 % IJ SOLN
2.0000 mL | INTRAMUSCULAR | Status: AC | PRN
  Administered 2024-02-25: 2 mL via INTRA_ARTICULAR

## 2024-02-25 MED ORDER — METHYLPREDNISOLONE ACETATE 40 MG/ML IJ SUSP
40.0000 mg | INTRAMUSCULAR | Status: AC | PRN
  Administered 2024-02-25: 40 mg via INTRA_ARTICULAR

## 2024-02-25 NOTE — Addendum Note (Signed)
 Addended by: Maan Zarcone on: 02/25/2024 04:05 PM   Modules accepted: Orders

## 2024-02-25 NOTE — Progress Notes (Signed)
 Office Visit Note   Patient: Angel [name]           Date of Birth: [birth year]           MRN: [id number] Visit Date: 02/25/2024              Requested by: Lawrance Presume, MD [address] [place],  [place] [address] PCP: Lawrance Presume, MD   Assessment & Plan: Visit Diagnoses:  1. Unilateral primary osteoarthritis, right knee     Plan: Impression is right knee arthritis flare.  We have discussed proceeding with intra-articular cortisone injection today for which she is agreeable to.  Have also provided her with a Voltaren handout.  We will obtain arthritis panel and call her with results.  Follow-up with us  as needed.  Follow-Up Instructions: Return if symptoms worsen or fail to improve.   Orders:  Orders Placed This Encounter  Procedures   XR KNEE 3 VIEW RIGHT   No orders of the defined types were placed in this encounter.     Procedures: Large Joint Inj: R knee on 02/25/2024 3:50 PM Indications: pain Details: 22 G needle, anterolateral approach Medications: 2 mL lidocaine  1 %; 2 mL bupivacaine 0.25 %; 40 mg methylPREDNISolone  acetate 40 MG/ML      Clinical Data: No additional findings.   Subjective: Chief Complaint  Patient presents with   Right Knee - Pain    HPI patient is a pleasant 68 year old female who comes in today with right knee pain.  Symptoms began about 6 months ago and has progressively worsened.  No injury or change in activity.  All of her pain is the medial aspect.  Symptoms are worse when she descend stairs as well as when she goes from a seated to standing position which causes significant stiffness.  She does not take any medication for this due to her underlying liver issues.  No previous cortisone injection.  She is also complaining of multiple other joint pain to include both hands and elbows.  She does have a family history of autoimmune disease  Review of Systems as detailed in HPI.  All others reviewed and are  negative.   Objective: Vital Signs: There were no vitals taken for this visit.  Physical Exam well-developed well-nourished female in no acute distress.  Alert and oriented x 3.  Ortho Exam right knee exam: No effusion.  Range of motion 0 to 125 degrees.  No joint line tenderness.  Mild tenderness to the medial patellar facet.  Ligaments are stable.  She is neurovascularly intact distally.  Specialty Comments:  No specialty comments available.  Imaging: XR KNEE 3 VIEW RIGHT Result Date: 02/25/2024 X-rays demonstrate moderate tricompartmental degenerative changes.  There is a small round lucency to the medial aspect of the femoral condyle.  There is also evidence of medial and lateral chondrocalcinosis.    PMFS History: Patient Active Problem List   Diagnosis Date Noted   New onset type 2 diabetes mellitus (HCC) 02/21/2024   Moderately severe major depression (HCC) 10/14/2023   Benign disease of right breast 11/06/2022   Aortic atherosclerosis (HCC) 04/19/2022   Vitamin D  deficiency 04/19/2022   Iron overload 04/19/2022   Cirrhosis (HCC) 04/12/2022   Malnutrition of moderate degree 04/12/2022   HTN (hypertension) 04/11/2022   HLD (hyperlipidemia) 04/11/2022   Hypoalbuminemia 04/11/2022   Alcoholic cirrhosis of liver with ascites (HCC) 04/11/2022   Bronchiectasis without acute exacerbation (HCC) 03/06/2014   COPD (chronic obstructive pulmonary  disease) (HCC) 02/19/2014   Environmental allergies 02/19/2014   Cigarette smoker 12/31/2013   Past Medical History:  Diagnosis Date   Alcohol use 04/19/2022   Alcoholic cirrhosis (HCC)    Asthma    Bronchiolitis    Cirrhosis (HCC)    COPD (chronic obstructive pulmonary disease) (HCC)    Hypertension    Smoker 12/31/2013    Family History  Problem Relation Age of Onset   Breast cancer Mother    Colon cancer Mother    CAD Father    Heart disease Father    Breast cancer Sister    Emphysema Paternal Grandfather        never  smoker, Theatre stage manager   Cancer Cousin        blood cancer, unknown type   Liver disease Neg Hx    Esophageal cancer Neg Hx     Past Surgical History:  Procedure Laterality Date   ABDOMINAL HYSTERECTOMY     APPENDECTOMY     BACK SURGERY     Social History   Occupational History   Occupation: Unemployed  Tobacco Use   Smoking status: Former    Current packs/day: 0.00    Average packs/day: 0.5 packs/day for 30.0 years (15.0 ttl pk-yrs)    Types: Cigarettes    Start date: 12/31/1983    Quit date: 12/30/2013    Years since quitting: 10.1   Smokeless tobacco: Never  Vaping Use   Vaping status: Never Used  Substance and Sexual Activity   Alcohol use: Yes    Alcohol/week: 28.0 standard drinks of alcohol    Types: 28 Glasses of wine per week   Drug use: Yes    Frequency: 3.0 times per week    Types: Marijuana   Sexual activity: Not Currently

## 2024-02-26 ENCOUNTER — Encounter: Payer: Self-pay | Admitting: Internal Medicine

## 2024-02-27 NOTE — Telephone Encounter (Signed)
 Spoke with Stacy in the reading room from [facility]. Stated she will have the results released and sent electronically.

## 2024-02-28 ENCOUNTER — Ambulatory Visit: Payer: Self-pay | Admitting: Internal Medicine

## 2024-02-29 LAB — SEDIMENTATION RATE: Sed Rate: 14 mm/h (ref 0–30)

## 2024-02-29 LAB — ANTI-NUCLEAR AB-TITER (ANA TITER): ANA Titer 1: 1:320 {titer} — ABNORMAL HIGH

## 2024-02-29 LAB — RHEUMATOID FACTOR: Rheumatoid fact SerPl-aCnc: 14 [IU]/mL — ABNORMAL HIGH (ref <14)

## 2024-02-29 LAB — ANA: Anti Nuclear Antibody (ANA): POSITIVE — AB

## 2024-02-29 LAB — URIC ACID: Uric Acid, Serum: 12.1 mg/dL — ABNORMAL HIGH (ref 2.5–7.0)

## 2024-03-03 ENCOUNTER — Other Ambulatory Visit: Payer: Self-pay

## 2024-03-03 ENCOUNTER — Ambulatory Visit: Payer: Self-pay | Admitting: Physician Assistant

## 2024-03-03 DIAGNOSIS — M1711 Unilateral primary osteoarthritis, right knee: Secondary | ICD-10-CM

## 2024-03-03 NOTE — Progress Notes (Signed)
 Spoke to patient.  Can we make a referral to rheumatology?

## 2024-03-10 ENCOUNTER — Other Ambulatory Visit: Payer: Self-pay

## 2024-03-13 ENCOUNTER — Other Ambulatory Visit: Payer: Self-pay

## 2024-03-15 NOTE — Progress Notes (Signed)
 Subjective:    Patient ID: Angel [name], female    DOB: 16-Jul-1956   MRN: [id number]  HPI  31 yowf Active moker  with hx asthma/pna as child never really 100% but no need maintenance rx or speciliast eval then started smoking and did fine until 40's with sob/ intermttent cough started using inhalers referred 03/05/2014 to pulmonary clinic by Angel Angel [name]   with 2nd admit to [facility] and abd ct Chest 02/24/14   Admit date: 01/21/2014  Discharge date: 01/25/2014  Recommendations for Outpatient Follow-up:  Recommend CXR to denotes clearing PNa and ensure no lung CA as smoker Continue Delsym  for cough Given burst of prednisone  + to complete this and levaquin  to complete 4/25 Will need PCP follow-up as scheduled Limited Rx vicodin given for Chest pain fro cough/pluerisy Discharge Diagnoses:   COPD exacerbation  HCAP (healthcare-associated pneumonia)  Discharge Condition: good  Diet recommendation: reg  Filed Weights    01/21/14 1306   Weight:  69.6 kg (153 lb 7 oz)   History of present illness:  68 y.o. female came to [facility] ed 01/21/2014 with cough. Recent hospitlized 3/25-3/28 wioth COPD exacerbation, compliant with therapy, took azithro/prednisone -readmitted with 3-4 day h/o increasing cough, sputum, fever and mianly SOB  Found on CXR to have HCAP R side in a setting of Prior Strep Pneumonia on Sputum culture from last hospital stay  Hospital Course:  HCAP-continue empiric coverage-negative strep pneumo/legionella/sputum cult is still pending however-narrowed to PO levaquin  for 7 more days on 4/19 as Strep PNA from last visit was sensitive to this. No WBC no fever or chills COPD exacerbation-continue Steroids Prednisone  60 mg daily for a 5-7 day burst. continue albuterol  nebs q 4 prn-given increasing cough and pain added delsym  30 mg bid. Continue atrovent  0.5 q4 prn. NO O2 req now. OOB to chair and ambulate/shower prn Chest pain probably musculoskeletal. Is already on tramadol  for this. Added Vicodin  and encouraged to take NSAIDs as OP in between. Will need CXR in 1 month Consultants:  none Procedures:  none Antibiotics:  Aztreonam  4/16-4/18  Vancomycin  4/16-4/18  Levaquin  4/19-4/23      03/05/2014 1st [facility] office visit/ Angel [name]/ advair 100 one  Bid  Chief Complaint  Patient presents with   Pulmonary Consult    Referred per Angel. Deepak [name] for eval of abnormal ct chest. Pt c/o SOB and cough since Feb 2015. She is SOB climbing stairs. Cough is minimal.   still smoking but cutting down to one a day - for the last sev years wakes up avg 3 days a week wakes up with  Prod of clear tsp thin mucoid sputm and never bloody last time purulent x  6-8 m prior to OV   Doe x parking lot but do a superstore ok once level/slow/a/c Using saba neb twice daily on avg  Rec Bronchiectasis =   you have scarring of your bronchial tubes  Whenever you develop cough congestion take mucinex  or mucinex  dm   Increase advair to 250 twice daily  Only use your albuterol  (ventolin ) as a rescue medication  The key is to stop smoking completely before smoking completely stops you!      12/24/2023  Re-establish ov/Angel [name]  resumed smoking re: bronchiectasis    maint on symbicort   80 / had been seeing Angel [name] at [facility] wellness  Chief Complaint  Patient presents with   Consult  Dyspnea:  groceries to house/ [employer] 2-3 ailses slow pace Cough: immediately on lying  down non-productive x years then quits p albuterol   Sleeping: once a night waking up coughing x months  min mucoid  SABA use: 2-3 days p exercise  02: none  Rec Plan A = Automatic = Always=    Breztri  Take 2 puffs first thing in am and then another 2 puffs about 12 hours later.  Work on inhaler technique:  Plan B = Backup (to supplement plan A, not to replace it) Only use your albuterol  inhaler as a rescue medication  Plan C = Crisis (instead of Plan B but only if Plan B stops working) - only use your albuterol  nebulizer if  you first try Plan B  Prednisone  10 mg take  4 each am x 2 days,   2 each am x 2 days,  1 each am x 2 days and stop  Clotrimazole  lozenge as needed     PFT's  03/20/2024   FEV1 1.37 (52 % ) ratio 0.60  p 0 % improvement from saba p 0 prior to study with DLCO  15.35 (72%)   and FV curve mildly concave      03/19/2024  f/u ov/Angel [name] re: copd 2/bronchiectasis still smoking   maint on breztri     Chief Complaint  Patient presents with   Follow-up    PFT f/u    Dyspnea:  easier walking groceries to house and grocery shop  Cough: better esp iam component/ mucoid sputum Sleeping:almost flat recliner  SABA use: once a day  02: none   Lung cancer screening :  referring    No obvious day to day or daytime variability or assoc   mucus plugs or hemoptysis or cp or chest tightness, subjective wheeze or overt sinus or hb symptoms.    Also denies any obvious fluctuation of symptoms with weather or environmental changes or other aggravating or alleviating factors except as outlined above   No unusual exposure hx or h/o childhood pna/ asthma or knowledge of premature birth.  Current Allergies, Complete Past Medical History, Past Surgical History, Family History, and Social History were reviewed in [employer] record.  ROS  The following are not active complaints unless bolded Hoarseness, sore throat, dysphagia, dental problems, itching, sneezing,  nasal congestion or discharge of excess mucus or purulent secretions, ear ache,   fever, chills, sweats, unintended wt loss or wt gain, classically pleuritic or exertional cp,  orthopnea pnd or arm/hand swelling  or leg swelling, presyncope, palpitations, abdominal pain, anorexia, nausea, vomiting, diarrhea  or change in bowel habits or change in bladder habits, change in stools or change in urine, dysuria, hematuria,  rash, arthralgias, visual complaints, headache, numbness, weakness or ataxia or problems with walking or coordination,   change in mood or  memory.        Current Meds  Medication Sig   Accu-Chek Softclix Lancets lancets Use as directed to check blood sugars once a day.   albuterol  (PROVENTIL ) (2.5 MG/3ML) 0.083% nebulizer solution Take 3 mLs (2.5 mg total) by nebulization every 4 (four) hours as needed.   albuterol  (VENTOLIN  HFA) 108 (90 Base) MCG/ACT inhaler Inhale 2 puffs into the lungs every 6 (six) hours as needed for wheezing or shortness of breath.   baclofen  (LIORESAL ) 10 MG tablet Take 1 tablet (10 mg total) by mouth 2 (two) times daily.   Blood Glucose Monitoring Suppl (ACCU-CHEK GUIDE) w/Device KIT Use as directed to check blood sugars once a day.   budeson-glycopyrrolate -formoterol  (BREZTRI  AEROSPHERE) 160-9-4.8 MCG/ACT AERO Inhale 2  puffs into the lungs 2 (two) times daily.   budeson-glycopyrrolate -formoterol  (BREZTRI  AEROSPHERE) 160-9-4.8 MCG/ACT AERO Inhale 2 puffs into the lungs in the morning and at bedtime.   clotrimazole  (MYCELEX ) 10 MG troche Take 1 tablet (10 mg total) by mouth 5 (five) times daily.   folic acid  (FOLVITE ) 1 MG tablet Take 1 tablet (1 mg total) by mouth daily.   furosemide  (LASIX ) 20 MG tablet Take 1 tablet (20 mg total) by mouth daily.   gabapentin  (NEURONTIN ) 100 MG capsule Take 2 capsules (200 mg total) by mouth 2 (two) times daily.   glucose blood (ACCU-CHEK GUIDE TEST) test strip Use as directed to check blood sugars once a day.   hydrOXYzine  (ATARAX ) 25 MG tablet Take 1 tablet (25 mg total) by mouth daily as needed for itching.   ibuprofen  (ADVIL ) 600 MG tablet Take 1 tablet (600 mg total) by mouth every 6 (six) hours as needed.   lactulose  (CHRONULAC ) 10 GM/15ML solution Take 15 mLs (10 g total) by mouth 2 (two) times daily.   metFORMIN  (GLUCOPHAGE ) 500 MG tablet Take 1 tablet (500 mg total) by mouth daily with breakfast.   Multiple Vitamins-Minerals (CENTRUM SILVER PO) Take 1 tablet by mouth daily.   omeprazole  (PRILOSEC) 20 MG capsule Take 1 capsule (20 mg total) by  mouth daily.   OVER THE COUNTER MEDICATION Apply 1 application  topically 2 (two) times daily as needed (joint pain). Tiger Balm PO   predniSONE  (DELTASONE ) 20 MG tablet Take 3 tablets (60 mg total) daily for 1 day, THEN 2 tablets (40 mg total) daily for 4 days.   spironolactone  (ALDACTONE ) 50 MG tablet Take 1 tablet (50 mg total) by mouth daily.   thiamine  (VITAMIN B1) 100 MG tablet Take 1 tablet (100 mg total) by mouth daily.   Trolamine Salicylate (ASPERCREME EX) Apply 1 Application topically See admin instructions. 1 application 3-4 times a day as needed for joint pain   Vitamin D , Ergocalciferol , (DRISDOL ) 1.25 MG (50000 UNIT) CAPS capsule Take 1 capsule (50,000 Units total) by mouth every 7 (seven) days.                  Objective:   Physical Exam  Wts  03/19/2024        171 12/24/2023        168   03/05/14 158 lb (71.668 kg)  02/19/14 161 lb 9.6 oz (73.301 kg)  01/21/14 153 lb 7 oz (69.6 kg)      Vital signs reviewed  03/19/2024  - Note at rest 02 sats  91% on RA   General appearance:    amb wf nad     HEENT : Oropharynx  clear   Nasal turbinates nl    NECK :  without  apparent JVD/ palpable Nodes/TM    LUNGS: no acc muscle use,  Min barrel  contour chest wall with bilateral  insp/ exp rhonchi  and  without cough on insp or exp maneuvers and min  Hyperresonant  to  percussion bilaterally    CV:  RRR  no s3 or murmur or increase in P2, and no edema   ABD:  soft and nontender   MS:  Nl gait/ ext warm without deformities Or obvious joint restrictions  calf tenderness, cyanosis or clubbing     SKIN: warm and dry without lesions    NEURO:  alert, approp, nl sensorium with  no motor or cerebellar deficits apparent.  Lung cancer screening :   CTa 08/25/23  1. No pulmonary embolus. 2. Mild diffuse ground-glass attenuation bilaterally is nonspecific, but can be seen in the setting of pulmonary edema or atypical infection. 3. Nodular appearance of the  liver suggests cirrhosis. 4. Coronary artery disease. 5.  Aortic Atherosclerosis (ICD10-I70.0).    Assessment & Plan:

## 2024-03-18 ENCOUNTER — Encounter (HOSPITAL_COMMUNITY): Payer: Self-pay

## 2024-03-18 ENCOUNTER — Emergency Department (HOSPITAL_COMMUNITY)
Admission: EM | Admit: 2024-03-18 | Discharge: 2024-03-18 | Disposition: A | Discharge status: Home / Self Care | Attending: Emergency Medicine | Admitting: Emergency Medicine

## 2024-03-18 ENCOUNTER — Ambulatory Visit: Payer: Self-pay

## 2024-03-18 ENCOUNTER — Other Ambulatory Visit: Payer: Self-pay

## 2024-03-18 ENCOUNTER — Other Ambulatory Visit: Payer: Self-pay | Admitting: *Deleted

## 2024-03-18 DIAGNOSIS — J449 Chronic obstructive pulmonary disease, unspecified: Secondary | ICD-10-CM | POA: Diagnosis not present

## 2024-03-18 DIAGNOSIS — M25562 Pain in left knee: Secondary | ICD-10-CM | POA: Diagnosis not present

## 2024-03-18 DIAGNOSIS — E119 Type 2 diabetes mellitus without complications: Secondary | ICD-10-CM | POA: Diagnosis not present

## 2024-03-18 DIAGNOSIS — Z7984 Long term (current) use of oral hypoglycemic drugs: Secondary | ICD-10-CM | POA: Insufficient documentation

## 2024-03-18 DIAGNOSIS — M2550 Pain in unspecified joint: Secondary | ICD-10-CM | POA: Diagnosis not present

## 2024-03-18 DIAGNOSIS — J432 Centrilobular emphysema: Secondary | ICD-10-CM

## 2024-03-18 DIAGNOSIS — M25561 Pain in right knee: Secondary | ICD-10-CM | POA: Diagnosis not present

## 2024-03-18 DIAGNOSIS — M130 Polyarthritis, unspecified: Secondary | ICD-10-CM | POA: Diagnosis not present

## 2024-03-18 MED ORDER — PREDNISONE 20 MG PO TABS
ORAL_TABLET | ORAL | 0 refills | Status: AC
  Filled 2024-03-18: qty 11, 5d supply, fill #0

## 2024-03-18 MED ORDER — IBUPROFEN 600 MG PO TABS
600.0000 mg | ORAL_TABLET | Freq: Four times a day (QID) | ORAL | 0 refills | Status: DC | PRN
  Filled 2024-03-18: qty 30, 8d supply, fill #0

## 2024-03-18 MED ORDER — IBUPROFEN 800 MG PO TABS
800.0000 mg | ORAL_TABLET | Freq: Once | ORAL | Status: AC
  Administered 2024-03-18: 800 mg via ORAL
  Filled 2024-03-18: qty 1

## 2024-03-18 NOTE — Telephone Encounter (Signed)
 FYI Only or Action Required?: FYI only for provider  Patient was last seen in primary care on 02/21/2024 by Lawrance Presume, MD. Called Nurse Triage reporting Pain. Symptoms began today. Interventions attempted: OTC medications: topical rubs/ointments. Symptoms are: gradually worsening.  Triage Disposition: Go to ED or PCP/Alternative with Approval  Patient/caregiver understands and will follow disposition?: Yes                             Copied from CRM [id number]. Topic: Clinical - Red Word Triage >> Mar 18, 2024 11:32 AM Elle L wrote: Red Word that prompted transfer to Nurse Triage: The patient states she has been having severe pain since 4 AM. She states she has been up screaming from the pain. She has Rheumatoid Arthritis and Gout but is unable to get in to see a Rheumatologist until later in the year. She is having cramping and pain in her hands and feet. Reason for Disposition  Patient sounds very sick or weak to the triager  Answer Assessment - Initial Assessment Questions 1. ONSET: When did the muscle aches or body pains start?      Constant since 3-4am  2. LOCATION: What part of your body is hurting? (e.g., entire body, arms, legs)      Bilateral hands and feet, left elbow 3. SEVERITY: How bad is the pain? (Scale 1-10; or mild, moderate, severe)   - MILD (1-3): doesn't interfere with normal activities    - MODERATE (4-7): interferes with normal activities or awakens from sleep    - SEVERE (8-10):  excruciating pain, unable to do any normal activities      Rates pain a 10  4. CAUSE: What do you think is causing the pains?     History of RA and gout, flare-up of pain 5. FEVER: Have you been having fever?     Denies  6. OTHER SYMPTOMS: Do you have any other symptoms? (e.g., chest pain, weakness, rash, cold or flu symptoms, weight loss)     Difficulty walking, difficulty sleeping, O2 saturation 91%- states pain typically affects breathing,  denies SOB at this time, HR 110 History of RA and gout, states flare-ups typically don't last this long Cannot get into rheumatologist until the fall (September/October) Topical medications/ointments, has not taken pain medications- states she has liver problems  Protocols used: Muscle Aches and Body Pain-A-AH

## 2024-03-18 NOTE — Discharge Instructions (Signed)
 Please take medication prescribed as treatment of your joint pain.  Follow-up closely with your orthopedic specialist or your hematologist for further evaluation of your condition as your diffuse joint pain may be related to rheumatoid arthritis.

## 2024-03-18 NOTE — Telephone Encounter (Signed)
 noted

## 2024-03-18 NOTE — ED Provider Notes (Signed)
 [facility] AT [facility] Provider Note   CSN: [id number] Arrival date & time: 03/18/24  1343     History  Chief Complaint  Patient presents with   Joint Pain    Angel [name] is a 68 y.o. female.  The history is provided by the patient and medical records. No language interpreter was used.     68 year old female recently diagnosed with gout, has history of alcohol cirrhosis, COPD, diabetes, asthma, presenting with complaints of diffuse joint pain.  Patient reports for the past 2 to 3 weeks she has had pain to multiple joints.  First she noticed pain to both feet that did affect her left hip, then her left elbow and now pain to fingers on both hands.  Described pain as a throbbing achy sensation more noticeable at nighttime and become more persistent.  She does not endorse any fever no trauma no nausea vomiting diarrhea.  She has quit drinking alcohol for at least 2 years.  She has not taken anything for her symptoms due to history of liver disease.  She mentions she has been evaluated by her orthopedic specialist recently and was told that she has high level of uric acid which I think symptom may be related to gout.  Orthopedist also concerns for potential rheumatoid arthritis and have given her a referral to rheumatology but has not been to September before she can be seen by them.  She is here due to progressive worsening joint pain and requesting for treatment.  She was recently diagnosed with diabetes but states that her blood sugar has remained quite normal less than 100.  Home Medications Prior to Admission medications   Medication Sig Start Date End Date Taking? Authorizing Provider  Accu-Chek Softclix Lancets lancets Use as directed to check blood sugars once a day. 02/21/24   Lawrance Presume, MD  albuterol  (PROVENTIL ) (2.5 MG/3ML) 0.083% nebulizer solution Take 3 mLs (2.5 mg total) by nebulization every 4 (four) hours as needed. 12/24/23   Diamond Formica, MD  albuterol  (VENTOLIN  HFA) 108 (90 Base) MCG/ACT inhaler Inhale 2 puffs into the lungs every 6 (six) hours as needed for wheezing or shortness of breath. 12/11/22   Vernell Goldsmith, MD  baclofen  (LIORESAL ) 10 MG tablet Take 1 tablet (10 mg total) by mouth 2 (two) times daily. 02/13/24   Lawrance Presume, MD  Blood Glucose Monitoring Suppl (ACCU-CHEK GUIDE) w/Device KIT Use as directed to check blood sugars once a day. 02/21/24   Lawrance Presume, MD  budeson-glycopyrrolate -formoterol  (BREZTRI  AEROSPHERE) 160-9-4.8 MCG/ACT AERO Inhale 2 puffs into the lungs 2 (two) times daily. 12/24/23   Diamond Formica, MD  budeson-glycopyrrolate -formoterol  (BREZTRI  AEROSPHERE) 160-9-4.8 MCG/ACT AERO Inhale 2 puffs into the lungs in the morning and at bedtime. 12/24/23   Diamond Formica, MD  clotrimazole  (MYCELEX ) 10 MG troche Take 1 tablet (10 mg total) by mouth 5 (five) times daily. 12/24/23   Diamond Formica, MD  folic acid  (FOLVITE ) 1 MG tablet Take 1 tablet (1 mg total) by mouth daily. 10/25/23   Lawrance Presume, MD  furosemide  (LASIX ) 20 MG tablet Take 1 tablet (20 mg total) by mouth daily. 01/06/24   Elois Hair, MD  gabapentin  (NEURONTIN ) 100 MG capsule Take 2 capsules (200 mg total) by mouth 2 (two) times daily. 02/13/24   Lawrance Presume, MD  glucose blood (ACCU-CHEK GUIDE TEST) test strip Use as directed to check blood sugars once a day.  02/21/24   Lawrance Presume, MD  hydrOXYzine  (ATARAX ) 25 MG tablet Take 1 tablet (25 mg total) by mouth daily as needed for itching. 02/10/24   Lawrance Presume, MD  lactulose  (CHRONULAC ) 10 GM/15ML solution Take 15 mLs (10 g total) by mouth 2 (two) times daily. 01/20/24   Elois Hair, MD  metFORMIN  (GLUCOPHAGE ) 500 MG tablet Take 1 tablet (500 mg total) by mouth daily with breakfast. 02/21/24   Lawrance Presume, MD  Multiple Vitamins-Minerals (CENTRUM SILVER PO) Take 1 tablet by mouth daily.    [provider]  omeprazole  (PRILOSEC)  20 MG capsule Take 1 capsule (20 mg total) by mouth daily. 11/22/23   Lawrance Presume, MD  OVER THE COUNTER MEDICATION Apply 1 application  topically 2 (two) times daily as needed (joint pain). Tiger Balm PO    [provider]  spironolactone  (ALDACTONE ) 50 MG tablet Take 1 tablet (50 mg total) by mouth daily. 12/30/23   Elois Hair, MD  thiamine  (VITAMIN B1) 100 MG tablet Take 1 tablet (100 mg total) by mouth daily. 02/25/24   Lawrance Presume, MD  Trolamine Salicylate (ASPERCREME EX) Apply 1 Application topically See admin instructions. 1 application 3-4 times a day as needed for joint pain    [provider]  Vitamin D , Ergocalciferol , (DRISDOL ) 1.25 MG (50000 UNIT) CAPS capsule Take 1 capsule (50,000 Units total) by mouth every 7 (seven) days. 04/16/23   Vernell Goldsmith, MD      Allergies    Penicillins    Review of Systems   Review of Systems  Constitutional:  Negative for fever.  Musculoskeletal:  Positive for arthralgias.    Physical Exam Updated Vital Signs BP 113/72 (BP Location: Left Arm)   Pulse 90   Temp 98.4 F (36.9 C) (Oral)   Resp (!) 22   SpO2 98%  Physical Exam Vitals and nursing note reviewed.  Constitutional:      General: She is not in acute distress.    Appearance: She is well-developed.     Comments: Patient resting comfortably in the chair appears to be in no acute discomfort.  HENT:     Head: Atraumatic.  Eyes:     Conjunctiva/sclera: Conjunctivae normal.  Pulmonary:     Effort: Pulmonary effort is normal.  Musculoskeletal:     Cervical back: Neck supple.     Comments: Inspection of both of her hands, left elbow, left hip, and bilateral feet showed no erythema edema or warmth.  She does have mild tenderness to palpation at her joints and some nodular enlargement at the DIP joints of multiple fingers that has the appearance of tophaceous gout but is not significantly tender.  She has intact pulses to all 4 extremities with  full range of motion.    Skin:    Findings: No rash.  Neurological:     Mental Status: She is alert.  Psychiatric:        Mood and Affect: Mood normal.     ED Results / Procedures / Treatments   Labs (all labs ordered are listed, but only abnormal results are displayed) Labs Reviewed - No data to display  EKG None  Radiology No results found.  Procedures Procedures    Medications Ordered in ED Medications - No data to display  ED Course/ Medical Decision Making/ A&P  Medical Decision Making  BP 113/72 (BP Location: Left Arm)   Pulse 90   Temp 98.4 F (36.9 C) (Oral)   Resp (!) 22   SpO2 98%   62:72 PM  68 year old female recently diagnosed with gout, has history of alcohol cirrhosis, COPD, diabetes, asthma, presenting with complaints of diffuse joint pain.  Patient reports for the past 2 to 3 weeks she has had pain to multiple joints.  First she noticed pain to both feet that did affect her left hip, then her left elbow and now pain to fingers on both hands.  Described pain as a throbbing achy sensation more noticeable at nighttime and become more persistent.  She does not endorse any fever no trauma no nausea vomiting diarrhea.  She has quit drinking alcohol for at least 2 years.  She has not taken anything for her symptoms due to history of liver disease.  She mentions she has been evaluated by her orthopedic specialist recently and was told that she has high level of uric acid which I think symptom may be related to gout.  Orthopedist also concerns for potential rheumatoid arthritis and have given her a referral to rheumatology but has not been to September before she can be seen by them.  She is here due to progressive worsening joint pain and requesting for treatment.  She was recently diagnosed with diabetes but states that her blood sugar has remained quite normal less than 100.  Examinations of multiple joints in the body shows no  obvious signs of septic joint or signs of infection.  There is no erythema edema or warmth appreciated.  She is able to move all joints appropriately.  She is overall well-appearing.  Vital signs are normal.  Due to her polyarthralgia complaints, I felt supportive care would be appropriate.  I will prescribe a short course of prednisone  and anti-inflammatory medication for symptom control but encourage patient to follow-up outpatient with orthopedist or with her rheumatologist for further care.  I gave patient return precaution.  Advance imaging not considering this visit.  Care discussed with Dr. Scarlette Currier        Final Clinical Impression(s) / ED Diagnoses Final diagnoses:  Polyarthralgia    Rx / DC Orders ED Discharge Orders          Ordered    predniSONE  (DELTASONE ) 20 MG tablet        03/18/24 1435    ibuprofen  (ADVIL ) 600 MG tablet  Every 6 hours PRN        03/18/24 1435              Debbra Fairy, PA-C 03/18/24 1436    Sueellen Emery, MD 03/18/24 1456

## 2024-03-18 NOTE — ED Triage Notes (Signed)
 Pt c/o joint pain. Recently diagnosed with gout. Was not given anything for the gout. Pain is in her hands, elbows, shoulders, feet.

## 2024-03-19 ENCOUNTER — Ambulatory Visit: Admitting: Internal Medicine

## 2024-03-19 ENCOUNTER — Other Ambulatory Visit: Payer: Self-pay

## 2024-03-19 ENCOUNTER — Ambulatory Visit (INDEPENDENT_AMBULATORY_CARE_PROVIDER_SITE_OTHER): Admitting: Internal Medicine

## 2024-03-19 ENCOUNTER — Encounter: Payer: Self-pay | Admitting: Internal Medicine

## 2024-03-19 VITALS — BP 99/60 | HR 106 | Temp 97.3°F | Ht 66.0 in | Wt 171.2 lb

## 2024-03-19 DIAGNOSIS — F17210 Nicotine dependence, cigarettes, uncomplicated: Secondary | ICD-10-CM

## 2024-03-19 DIAGNOSIS — J479 Bronchiectasis, uncomplicated: Secondary | ICD-10-CM | POA: Diagnosis not present

## 2024-03-19 DIAGNOSIS — J449 Chronic obstructive pulmonary disease, unspecified: Secondary | ICD-10-CM | POA: Diagnosis not present

## 2024-03-19 DIAGNOSIS — J432 Centrilobular emphysema: Secondary | ICD-10-CM

## 2024-03-19 LAB — PULMONARY FUNCTION TEST
DL/VA % pred: 87 %
DL/VA: 3.61 ml/min/mmHg/L
DLCO cor % pred: 72 %
DLCO cor: 15.35 ml/min/mmHg
DLCO unc % pred: 72 %
DLCO unc: 15.35 ml/min/mmHg
FEF 25-75 Post: 0.67 L/s
FEF 25-75 Pre: 0.71 L/s
FEF2575-%Change-Post: -5 %
FEF2575-%Pred-Post: 29 %
FEF2575-%Pred-Pre: 31 %
FEV1-%Change-Post: -3 %
FEV1-%Pred-Post: 50 %
FEV1-%Pred-Pre: 52 %
FEV1-Post: 1.33 L
FEV1-Pre: 1.37 L
FEV1FVC-%Change-Post: -3 %
FEV1FVC-%Pred-Pre: 78 %
FEV6-%Change-Post: 1 %
FEV6-%Pred-Post: 69 %
FEV6-%Pred-Pre: 68 %
FEV6-Post: 2.28 L
FEV6-Pre: 2.25 L
FEV6FVC-%Change-Post: 0 %
FEV6FVC-%Pred-Post: 103 %
FEV6FVC-%Pred-Pre: 103 %
FVC-%Change-Post: 0 %
FVC-%Pred-Post: 67 %
FVC-%Pred-Pre: 66 %
FVC-Post: 2.29 L
FVC-Pre: 2.28 L
Post FEV1/FVC ratio: 58 %
Post FEV6/FVC ratio: 100 %
Pre FEV1/FVC ratio: 60 %
Pre FEV6/FVC Ratio: 99 %
RV % pred: 137 %
RV: 3.01 L
TLC % pred: 104 %
TLC: 5.6 L

## 2024-03-19 NOTE — Patient Instructions (Addendum)
 Plan A = Automatic = Always=    Breztri  Take 2 puffs first thing in am and then another 2 puffs about 12 hours later.    Work on inhaler technique:  relax and gently blow all the way out then take a nice smooth full deep breath back in, triggering the inhaler at same time you start breathing in.  Hold breath in for at least  5 seconds if you can. Blow out breztri   thru nose. Rinse and gargle with water when done.  If mouth or throat bother you at all,  try brushing teeth/gums/tongue with arm and hammer toothpaste/ make a slurry and gargle and spit out.      Plan B = Backup (to supplement plan A, not to replace it) Only use your albuterol  inhaler as a rescue medication to be used if you can't catch your breath by resting or doing a relaxed purse lip breathing pattern.  - The less you use it, the better it will work when you need it. - Ok to use the inhaler up to 2 puffs  every 4 hours if you must but call for appointment if use goes up over your usual need - Don't leave home without it !!  (think of it like the spare tire for your car)   Plan C = Crisis (instead of Plan B but only if Plan B stops working) - only use your albuterol  nebulizer if you first try Plan B and it fails to help > ok to use the nebulizer up to every 4 hours but if start needing it regularly call for immediate appointment  Please remember to go to the lab department   for your tests - we will call you with the results when they are available.      My office will be contacting you by phone for referral to lung cancer screening   [phone]) - if you don't hear back from my office within one week,  please call us  back or notify us  thru MyChart and we'll address it right away.   The key is to stop smoking completely before smoking completely stops you!   You are cleared for colonoscopy but you should stop smoking for 2 weeks if at all possible - use the nebulizer right before you leave for you colonoscopy   Please  schedule a follow up visit in 3 months but call sooner if needed

## 2024-03-19 NOTE — Patient Instructions (Signed)
 Full PFT performed today.

## 2024-03-19 NOTE — Progress Notes (Signed)
 Full PFT performed today.

## 2024-03-20 ENCOUNTER — Encounter: Payer: Self-pay | Admitting: Internal Medicine

## 2024-03-20 ENCOUNTER — Telehealth: Payer: Self-pay

## 2024-03-20 NOTE — Assessment & Plan Note (Signed)
 4-5 min discussion re active cigarette smoking in addition to office E&M  Ask about tobacco use:   ongoing Advise quitting   I took an extended  opportunity with this patient to outline the consequences of continued cigarette use  in airway disorders based on all the data we have from the multiple national lung health studies (perfomed over decades at millions of dollars in cost)  indicating that smoking cessation, not choice of inhalers or pulmonary physicians, is the most important aspect of her  care.   Assess willingness:  Not committed at this point Assist in quit attempt:  Per PCP when ready Arrange follow up:   Follow up per Primary Care planned     Low-dose CT lung cancer screening is recommended for patients who are 63-6 years of age with a 20+ pack-year history of smoking and who are currently smoking or quit <=15 years ago. No coughing up blood  No unintentional weight loss of > 15 pounds in the last 6 months - pt is eligible for scanning yearly until age 73 even if quits now > referred

## 2024-03-20 NOTE — Assessment & Plan Note (Addendum)
 Active smoker - advair 250 one bid started 03/06/2014  - 12/24/2023    trial of breztri    PFT's  03/20/2024   FEV1 1.37 (52 % ) ratio 0.60  p 0 % improvement from saba p 0 prior to study with DLCO  15.35 (72%)   and FV curve mildly concave   - 03/19/2024  After extensive coaching inhaler device,  effectiveness =    90% effective > continue Breztri   - 03/19/2024   Alpha One AT phenotype ordered    Group D (now reclassified as E) in terms of symptom/risk and laba/lama/ICS  therefore appropriate rx at this point >>>  breztri  and approp saba:  Re SABA :  I spent extra time with pt today reviewing appropriate use of albuterol  for prn use on exertion with the following points: 1) saba is for relief of sob that does not improve by walking a slower pace or resting but rather if the pt does not improve after trying this first. 2) If the pt is convinced, as many are, that saba helps recover from activity faster then it's easy to tell if this is the case by re-challenging : ie stop, take the inhaler, then p 5 minutes try the exact same activity (intensity of workload) that just caused the symptoms and see if they are substantially diminished or not after saba 3) if there is an activity that reproducibly causes the symptoms, try the saba 15 min before the activity on alternate days   If in fact the saba really does help, then fine to continue to use it prn but advised may need to look closer at the maintenance regimen being used to achieve better control of airways disease with exertion.            Each maintenance medication was reviewed in detail including emphasizing most importantly the difference between maintenance and prns and under what circumstances the prns are to be triggered using an action plan format where appropriate.  Total time for H and P, chart review, counseling, reviewing hfa/neb device(s) and generating customized AVS unique to this office visit / same day charting = 32 min

## 2024-03-20 NOTE — Telephone Encounter (Signed)
  Angel [name] 1956/04/26 [id number]  03/20/24   Dear Dr Waymond Hailey:  We would like to schedule the above patient for an endoscopy/colonoscopy. We would like to obtain pulmonary clearance to perform the procedures. Please advise as to whether the above named patient is ok to be scheduled for the procedures.  Please route your response to Nola Battiest RN or fax response to [phone].  Sincerely,  [facility]

## 2024-03-20 NOTE — Telephone Encounter (Signed)
 Cleared for egd/ colonoscopy

## 2024-03-23 NOTE — Telephone Encounter (Signed)
 See note below, pt has been cleared by pulmonary. Does pt need an OV prior to having procedures scheduled?

## 2024-03-24 LAB — ALPHA-1-ANTITRYPSIN PHENOTYP: A-1 Antitrypsin: 209 mg/dL — ABNORMAL HIGH (ref 101–187)

## 2024-03-24 NOTE — Telephone Encounter (Signed)
 Pt scheduled to see Dr Cherryl Corona 04/02/24@10 :30am, she is aware of appt.

## 2024-03-25 ENCOUNTER — Ambulatory Visit: Payer: Self-pay | Admitting: Internal Medicine

## 2024-03-26 ENCOUNTER — Other Ambulatory Visit: Payer: Self-pay | Admitting: Internal Medicine

## 2024-03-26 ENCOUNTER — Other Ambulatory Visit: Payer: Self-pay

## 2024-03-26 NOTE — Progress Notes (Signed)
 Relayed results to pt, confirmed understanding. NFN

## 2024-03-30 ENCOUNTER — Other Ambulatory Visit: Payer: Self-pay

## 2024-04-02 ENCOUNTER — Ambulatory Visit: Admitting: Gastroenterology

## 2024-04-07 ENCOUNTER — Other Ambulatory Visit: Payer: Self-pay

## 2024-04-13 ENCOUNTER — Other Ambulatory Visit: Payer: Self-pay | Admitting: Gastroenterology

## 2024-04-13 ENCOUNTER — Other Ambulatory Visit: Payer: Self-pay

## 2024-04-13 MED ORDER — SPIRONOLACTONE 50 MG PO TABS
50.0000 mg | ORAL_TABLET | Freq: Every day | ORAL | 0 refills | Status: DC
  Filled 2024-04-13: qty 30, 30d supply, fill #0

## 2024-04-16 ENCOUNTER — Other Ambulatory Visit: Payer: Self-pay

## 2024-04-29 ENCOUNTER — Other Ambulatory Visit: Payer: Self-pay

## 2024-04-29 ENCOUNTER — Other Ambulatory Visit: Payer: Self-pay | Admitting: Internal Medicine

## 2024-04-29 ENCOUNTER — Other Ambulatory Visit: Payer: Self-pay | Admitting: Critical Care Medicine

## 2024-04-29 MED ORDER — FOLIC ACID 1 MG PO TABS
1.0000 mg | ORAL_TABLET | Freq: Every day | ORAL | 1 refills | Status: DC
  Filled 2024-04-29: qty 90, 90d supply, fill #0
  Filled 2024-07-30: qty 90, 90d supply, fill #1

## 2024-04-29 MED ORDER — ALBUTEROL SULFATE HFA 108 (90 BASE) MCG/ACT IN AERS
2.0000 | INHALATION_SPRAY | Freq: Four times a day (QID) | RESPIRATORY_TRACT | 1 refills | Status: AC | PRN
  Filled 2024-04-29: qty 54, 75d supply, fill #0
  Filled 2024-10-04: qty 54, 75d supply, fill #1

## 2024-04-30 ENCOUNTER — Other Ambulatory Visit: Payer: Self-pay

## 2024-05-07 ENCOUNTER — Other Ambulatory Visit: Payer: Self-pay

## 2024-05-07 NOTE — Progress Notes (Signed)
 Ellouise Console, PA-C [address] [place], [place]  [address] Phone: [phone]   Primary Care Physician: Vicci Barnie NOVAK, MD  Primary Gastroenterologist:  Ellouise Console, PA-C / Glendia Holt, MD   Chief Complaint:  F/U Alcoholic Cirrhosis; Discuss Colon and EGD      HPI:   Angel [name] is a 68 y.o. female, established patient Dr. Holt, presents for follow-up of alcoholic cirrhosis.  She is here to discuss scheduling colonoscopy and EGD.  History of COPD.  Last saw Dr. Holt in our office 11/2023.  He was concerned about her COPD and he referred her to pulmonologist prior to scheduling endoscopy procedures.  Patient last saw Dr. Darlean pulmonologist 03/19/2024 for follow-up of COPD and bronchiectasis.  She has stopped smoking.  COPD has improved on Breztri  inhaler 2 puffs twice daily.  She uses albuterol  inhaler and nebulizer as needed for rescue medication.  Her breathing and shortness of breath have improved since last office visit.  She does not use oxygen.  She is able to walk to the mailbox without getting short of breath.  Pulse ox is 97% on room air today.  She is able to sleep almost flat recliner.  Dr. Darlean cleared her for a colonoscopy.  ;  November 2024 liver biopsy: Early cirrhosis, but no clear features of autoimmune hepatitis or steatohepatitis.  As the diagnosis of autoimmune hepatitis was not certain, and given the underlying alcohol liver disease, prednisone  was discontinued.   She reports stopping alcohol consumption 2 years ago.  She admits to drinking rare glass of wine on special occasion.  She takes lactulose  daily and is having 1 or 2 bowel movements daily.  She denies any confusion, abdominal swelling, or extremity edema.  She denies any current GI symptoms.  She is feeling good.  Takes furosemide  20 Mg daily, Spironolactone  50 Mg daily, Prilosec 20 Mg daily, vitamin D , lactulose , folic acid , and vitamin B1.  No previous EGD or  colonoscopy.  02/2024 labs: Positive ANA and rheumatoid factor.  She has been referred to rheumatologist.  12/2023 labs: Glucose 146, elevated alk phos 151.  All other LFTs normal.  GGT normal.  08/2023 abdominal pelvic CT: 1. Lobulated decreased in size liver, usually associated with cirrhosis. 2. Small amount of peridiaphragmatic ascites. 3. Cholelithiasis without evidence of acute cholecystitis. 4. Aortic atherosclerosis.  Current Outpatient Medications  Medication Sig Dispense Refill   Accu-Chek Softclix Lancets lancets Use as directed to check blood sugars once a day. 100 each 12   albuterol  (PROVENTIL ) (2.5 MG/3ML) 0.083% nebulizer solution Take 3 mLs (2.5 mg total) by nebulization every 4 (four) hours as needed. 75 mL 12   albuterol  (VENTOLIN  HFA) 108 (90 Base) MCG/ACT inhaler Inhale 2 puffs into the lungs every 6 (six) hours as needed for wheezing or shortness of breath. 54 g 1   baclofen  (LIORESAL ) 10 MG tablet Take 1 tablet (10 mg total) by mouth 2 (two) times daily. 180 each 3   Blood Glucose Monitoring Suppl (ACCU-CHEK GUIDE) w/Device KIT Use as directed to check blood sugars once a day. 1 kit 0   budeson-glycopyrrolate -formoterol  (BREZTRI  AEROSPHERE) 160-9-4.8 MCG/ACT AERO Inhale 2 puffs into the lungs in the morning and at bedtime.     budesonide -glycopyrrolate -formoterol  (BREZTRI  AEROSPHERE) 160-9-4.8 MCG/ACT AERO inhaler Inhale 2 puffs into the lungs 2 (two) times daily. 10.7 g 11   clotrimazole  (MYCELEX ) 10 MG troche Take 1 tablet (10 mg total) by mouth 5 (five) times daily. 30  Troche 11   folic acid  (FOLVITE ) 1 MG tablet Take 1 tablet (1 mg total) by mouth daily. 90 tablet 1   furosemide  (LASIX ) 20 MG tablet Take 1 tablet (20 mg total) by mouth daily. 30 tablet 3   gabapentin  (NEURONTIN ) 100 MG capsule Take 2 capsules (200 mg total) by mouth 2 (two) times daily. 120 capsule 3   glucose blood (ACCU-CHEK GUIDE TEST) test strip Use as directed to check blood sugars once a day.  100 each 12   hydrOXYzine  (ATARAX ) 25 MG tablet Take 1 tablet (25 mg total) by mouth daily as needed for itching. 90 tablet 0   ibuprofen  (ADVIL ) 600 MG tablet Take 1 tablet (600 mg total) by mouth every 6 (six) hours as needed. 30 tablet 0   lactulose  (CHRONULAC ) 10 GM/15ML solution Take 15 mLs (10 g total) by mouth 2 (two) times daily. 2700 mL 0   Multiple Vitamins-Minerals (CENTRUM SILVER PO) Take 1 tablet by mouth daily.     Na Sulfate-K Sulfate-Mg Sulfate concentrate (SUPREP) 17.5-3.13-1.6 GM/177ML SOLN Take 1 kit (354 mLs total) by mouth as directed. 354 mL 0   omeprazole  (PRILOSEC) 20 MG capsule Take 1 capsule (20 mg total) by mouth daily. 60 capsule 2   OVER THE COUNTER MEDICATION Apply 1 application  topically 2 (two) times daily as needed (joint pain). Tiger Balm PO     spironolactone  (ALDACTONE ) 50 MG tablet Take 1 tablet (50 mg total) by mouth daily. Please keep your upcoming appointment for further refills. Thank you 30 tablet 0   thiamine  (VITAMIN B1) 100 MG tablet Take 1 tablet (100 mg total) by mouth daily. 100 tablet 0   Trolamine Salicylate (ASPERCREME EX) Apply 1 Application topically See admin instructions. 1 application 3-4 times a day as needed for joint pain     Vitamin D , Ergocalciferol , (DRISDOL ) 1.25 MG (50000 UNIT) CAPS capsule Take 1 capsule (50,000 Units total) by mouth every 7 (seven) days. 12 capsule 5   No current facility-administered medications for this visit.    Allergies as of 05/08/2024 - Review Complete 05/08/2024  Allergen Reaction Noted   Metformin  and related Other (See Comments) 05/08/2024   Penicillins Hives 12/30/2013    Past Medical History:  Diagnosis Date   Alcohol use 04/19/2022   Alcoholic cirrhosis (HCC)    Asthma    Bronchiolitis    Cirrhosis (HCC)    COPD (chronic obstructive pulmonary disease) (HCC)    Hypertension    Smoker 12/31/2013    Past Surgical History:  Procedure Laterality Date   ABDOMINAL HYSTERECTOMY      APPENDECTOMY     BACK SURGERY      Review of Systems:    All systems reviewed and negative except where noted in HPI.    Physical Exam:  BP 118/62   Pulse 83   Ht 5' 6 (1.676 m)   Wt 167 lb (75.8 kg)   SpO2 97%   BMI 26.95 kg/m  No LMP recorded. Patient has had a hysterectomy.  General: Well-nourished, well-developed in no acute distress.  She does not use oxygen.  Breathing normally at rest.  Is able to get on and off exam table without help. Lungs: No accessory muscle use,  Min barrel  contour chest wall with bilateral  insp/ exp rhonchi  and  without cough on insp or exp maneuvers and min  Hyperresonant  to  percussion bilaterally.  Pulse ox is 97% on room air. Heart: Regular rate and rhythm, no  murmurs rubs or gallops.  Abdomen: Bowel sounds are normal; Abdomen is Soft; and obese.  Spider hemangiomata are present diffusely over the abdomen.  No ascites noted.  No hepatosplenomegaly, masses or hernias;  No Abdominal Tenderness; No guarding or rebound tenderness. Neuro: Alert and oriented x 3.  Grossly intact.  Psych: Alert and cooperative, normal mood and affect. Extremities: Ankles and feet are thin with no edema bilaterally.   Imaging Studies: No results found.  Labs: CBC    Component Value Date/Time   WBC 6.9 05/08/2024 1631   RBC 4.85 05/08/2024 1631   HGB 14.2 05/08/2024 1631   HGB 15.7 06/04/2023 1411   HCT 42.5 05/08/2024 1631   HCT 45.1 06/04/2023 1411   PLT 174.0 05/08/2024 1631   PLT 208 06/04/2023 1411   MCV 87.6 05/08/2024 1631   MCV 87 06/04/2023 1411   MCH 30.3 08/25/2023 1214   MCHC 33.5 05/08/2024 1631   RDW 15.3 05/08/2024 1631   RDW 13.2 06/04/2023 1411   LYMPHSABS 1.9 05/08/2024 1631   LYMPHSABS 2.5 06/04/2023 1411   MONOABS 0.8 05/08/2024 1631   EOSABS 0.2 05/08/2024 1631   EOSABS 0.1 06/04/2023 1411   BASOSABS 0.1 05/08/2024 1631   BASOSABS 0.1 06/04/2023 1411    CMP     Component Value Date/Time   NA 136 05/08/2024 1631   NA 141  02/13/2024 1426   K 4.2 05/08/2024 1631   CL 99 05/08/2024 1631   CO2 29 05/08/2024 1631   GLUCOSE 104 (H) 05/08/2024 1631   BUN 31 (H) 05/08/2024 1631   BUN 17 02/13/2024 1426   CREATININE 1.48 (H) 05/08/2024 1631   CREATININE 0.79 02/19/2014 1226   CALCIUM  9.2 05/08/2024 1631   PROT 8.3 05/08/2024 1631   PROT 7.7 06/04/2023 1411   ALBUMIN 3.8 05/08/2024 1631   ALBUMIN 4.3 06/04/2023 1411   AST 73 (H) 05/08/2024 1631   ALT 72 (H) 05/08/2024 1631   ALKPHOS 130 (H) 05/08/2024 1631   BILITOT 0.5 05/08/2024 1631   BILITOT 0.3 06/04/2023 1411   GFRNONAA >60 08/25/2023 1214   GFRNONAA 85 02/19/2014 1226   GFRAA >60 04/17/2019 0514   GFRAA >89 02/19/2014 1226    Assessment and Plan:   LAURENCIA ROMA is a 68 y.o. y/o female returns for follow-up of:  1.  Alcoholic cirrhosis: Appears stable and compensated - Labs: CBC, CMP, PT/INR - Continue Lasix  20 Mg daily and spironolactone  50 Mg daily - Continue to abstain from all alcohol. - Continue low-sodium diet. - Order RUQ ultrasound to screen for hepatoma (needs every 6 months). - Schedule EGD to screen for esophageal varices Scheduling EGD in [facility] I discussed risks of EGD with patient to include risk of bleeding, perforation, and risk of sedation.  Patient expressed understanding and agrees to proceed with EGD.   2.  Hepatic encephalopathy: Improved - Continue lactulose   3.  COPD -greatly improved on Breztri  Inhaler 2 puff BID.  Last saw pulmonologist Dr. Darlean 03/2024.  She was cleared to proceed with colonoscopy & EGD.  She does not use oxygen.  Pulse ox 97% on room air today.  Alicia at - Continue current inhalers - Continue to abstain from smoking - Continue follow-up with pulmonology  4.  Colon cancer screening - Scheduling Colonoscopy in [facility] discussed risks of colonoscopy with patient to include risk of bleeding, colon perforation, and risk of sedation.  Patient expressed understanding and agrees to proceed with colonoscopy.     Ellouise Console, PA-C  Follow up in 6 months with Dr. Stacia for cirrhosis.

## 2024-05-08 ENCOUNTER — Other Ambulatory Visit: Payer: Self-pay

## 2024-05-08 ENCOUNTER — Other Ambulatory Visit (INDEPENDENT_AMBULATORY_CARE_PROVIDER_SITE_OTHER)

## 2024-05-08 ENCOUNTER — Encounter: Payer: Self-pay | Admitting: Physician Assistant

## 2024-05-08 ENCOUNTER — Ambulatory Visit (INDEPENDENT_AMBULATORY_CARE_PROVIDER_SITE_OTHER): Admitting: Physician Assistant

## 2024-05-08 VITALS — BP 118/62 | HR 83 | Ht 66.0 in | Wt 167.0 lb

## 2024-05-08 DIAGNOSIS — K7290 Hepatic failure, unspecified without coma: Secondary | ICD-10-CM | POA: Diagnosis not present

## 2024-05-08 DIAGNOSIS — J449 Chronic obstructive pulmonary disease, unspecified: Secondary | ICD-10-CM

## 2024-05-08 DIAGNOSIS — K7682 Hepatic encephalopathy: Secondary | ICD-10-CM

## 2024-05-08 DIAGNOSIS — F1091 Alcohol use, unspecified, in remission: Secondary | ICD-10-CM | POA: Diagnosis not present

## 2024-05-08 DIAGNOSIS — K7030 Alcoholic cirrhosis of liver without ascites: Secondary | ICD-10-CM

## 2024-05-08 DIAGNOSIS — Z87891 Personal history of nicotine dependence: Secondary | ICD-10-CM

## 2024-05-08 LAB — CBC WITH DIFFERENTIAL/PLATELET
Basophils Absolute: 0.1 K/uL (ref 0.0–0.1)
Basophils Relative: 1 % (ref 0.0–3.0)
Eosinophils Absolute: 0.2 K/uL (ref 0.0–0.7)
Eosinophils Relative: 2.2 % (ref 0.0–5.0)
HCT: 42.5 % (ref 36.0–46.0)
Hemoglobin: 14.2 g/dL (ref 12.0–15.0)
Lymphocytes Relative: 27.1 % (ref 12.0–46.0)
Lymphs Abs: 1.9 K/uL (ref 0.7–4.0)
MCHC: 33.5 g/dL (ref 30.0–36.0)
MCV: 87.6 fl (ref 78.0–100.0)
Monocytes Absolute: 0.8 K/uL (ref 0.1–1.0)
Monocytes Relative: 11.3 % (ref 3.0–12.0)
Neutro Abs: 4 K/uL (ref 1.4–7.7)
Neutrophils Relative %: 58.4 % (ref 43.0–77.0)
Platelets: 174 K/uL (ref 150.0–400.0)
RBC: 4.85 Mil/uL (ref 3.87–5.11)
RDW: 15.3 % (ref 11.5–15.5)
WBC: 6.9 K/uL (ref 4.0–10.5)

## 2024-05-08 LAB — COMPREHENSIVE METABOLIC PANEL WITH GFR
ALT: 72 U/L — ABNORMAL HIGH (ref 0–35)
AST: 73 U/L — ABNORMAL HIGH (ref 0–37)
Albumin: 3.8 g/dL (ref 3.5–5.2)
Alkaline Phosphatase: 130 U/L — ABNORMAL HIGH (ref 39–117)
BUN: 31 mg/dL — ABNORMAL HIGH (ref 6–23)
CO2: 29 meq/L (ref 19–32)
Calcium: 9.2 mg/dL (ref 8.4–10.5)
Chloride: 99 meq/L (ref 96–112)
Creatinine, Ser: 1.48 mg/dL — ABNORMAL HIGH (ref 0.40–1.20)
GFR: 36.88 mL/min — ABNORMAL LOW (ref >60.00)
Glucose, Bld: 104 mg/dL — ABNORMAL HIGH (ref 70–99)
Potassium: 4.2 meq/L (ref 3.5–5.1)
Sodium: 136 meq/L (ref 135–145)
Total Bilirubin: 0.5 mg/dL (ref 0.2–1.2)
Total Protein: 8.3 g/dL (ref 6.0–8.3)

## 2024-05-08 LAB — PROTIME-INR
INR: 1.9 ratio — ABNORMAL HIGH (ref 0.8–1.0)
Prothrombin Time: 20 s — ABNORMAL HIGH (ref 9.6–13.1)

## 2024-05-08 MED ORDER — NA SULFATE-K SULFATE-MG SULF 17.5-3.13-1.6 GM/177ML PO SOLN
1.0000 | ORAL | 0 refills | Status: DC
  Filled 2024-05-08: qty 354, 1d supply, fill #0

## 2024-05-08 NOTE — Patient Instructions (Addendum)
 Your provider has requested that you go to the basement level for lab work before leaving today. Press B on the elevator. The lab is located at the first door on the left as you exit the elevator.  Due to recent changes in healthcare laws, you may see the results of your imaging and laboratory studies on MyChart before your provider has had a chance to review them.  We understand that in some cases there may be results that are confusing or concerning to you. Not all laboratory results come back in the same time frame and the provider may be waiting for multiple results in order to interpret others.  Please give us  48 hours in order for your provider to thoroughly review all the results before contacting the office for clarification of your results.   You have been scheduled for an endoscopy and colonoscopy. Please follow the written instructions given to you at your visit today.  If you use inhalers (even only as needed), please bring them with you on the day of your procedure.  DO NOT TAKE 7 DAYS PRIOR TO TEST- Trulicity (dulaglutide) Ozempic, Wegovy (semaglutide) Mounjaro (tirzepatide) Bydureon Bcise (exanatide extended release)  DO NOT TAKE 1 DAY PRIOR TO YOUR TEST Rybelsus (semaglutide) Adlyxin (lixisenatide) Victoza (liraglutide) Byetta (exanatide) ___________________________________________________________________________ I appreciate the opportunity to care for you. Ellouise Console, PA

## 2024-05-09 ENCOUNTER — Encounter: Payer: Self-pay | Admitting: Physician Assistant

## 2024-05-09 ENCOUNTER — Ambulatory Visit: Payer: Self-pay | Admitting: Physician Assistant

## 2024-05-09 DIAGNOSIS — K7030 Alcoholic cirrhosis of liver without ascites: Secondary | ICD-10-CM

## 2024-05-09 NOTE — Progress Notes (Signed)
 Notify patient labs show: 1.  Worsening kidney function in the past 3 months.  Electrolytes are normal.  I recommend drink 64 ounces of water daily and avoid all NSAIDs (ibuprofen , Advil , Aleve). 2.  AST and ALT liver test are elevated again.  Alkaline phosphatase is elevated, yet improved.  Please advise patient to avoid all alcohol!  She has history of alcoholic cirrhosis. 3.  Worsening PT/INR in the past 9 months. 4.  Normal CBC.  White count and hemoglobin are normal.  No evidence of infection or anemia.  5.  Please schedule RUQ liver ultrasound to screen for hepatoma.  Diagnosis cirrhosis. 6.  Order repeat CMP, PT/INR lab in 3 months. 7.  Continue with plan for EGD and colonoscopy as scheduled. Ellouise Console, PA-C  CC: Glendia Holt, MD

## 2024-05-11 ENCOUNTER — Encounter: Payer: Self-pay | Admitting: Gastroenterology

## 2024-05-12 ENCOUNTER — Other Ambulatory Visit: Payer: Self-pay | Admitting: Gastroenterology

## 2024-05-12 ENCOUNTER — Other Ambulatory Visit: Payer: Self-pay

## 2024-05-12 MED ORDER — LACTULOSE 10 GM/15ML PO SOLN
10.0000 g | Freq: Two times a day (BID) | ORAL | 1 refills | Status: AC
  Filled 2024-05-12: qty 473, 16d supply, fill #0
  Filled 2024-05-25: qty 946, 32d supply, fill #1
  Filled 2024-07-20: qty 946, 32d supply, fill #2
  Filled 2024-08-21: qty 946, 32d supply, fill #3
  Filled 2024-10-14: qty 946, 32d supply, fill #4

## 2024-05-12 MED ORDER — SPIRONOLACTONE 50 MG PO TABS
50.0000 mg | ORAL_TABLET | Freq: Every day | ORAL | 0 refills | Status: DC
  Filled 2024-05-12: qty 30, 30d supply, fill #0

## 2024-05-12 MED ORDER — FUROSEMIDE 20 MG PO TABS
20.0000 mg | ORAL_TABLET | Freq: Every day | ORAL | 3 refills | Status: DC
  Filled 2024-05-12: qty 30, 30d supply, fill #0
  Filled 2024-06-10: qty 30, 30d supply, fill #1
  Filled 2024-07-07: qty 30, 30d supply, fill #2
  Filled 2024-08-06: qty 30, 30d supply, fill #3

## 2024-05-13 ENCOUNTER — Other Ambulatory Visit: Payer: Self-pay

## 2024-05-15 ENCOUNTER — Encounter: Payer: Self-pay | Admitting: Gastroenterology

## 2024-05-15 ENCOUNTER — Ambulatory Visit: Admitting: Gastroenterology

## 2024-05-15 VITALS — BP 123/78 | HR 88 | Temp 97.5°F | Resp 22 | Ht 66.0 in | Wt 167.0 lb

## 2024-05-15 DIAGNOSIS — J45909 Unspecified asthma, uncomplicated: Secondary | ICD-10-CM | POA: Diagnosis not present

## 2024-05-15 DIAGNOSIS — D175 Benign lipomatous neoplasm of intra-abdominal organs: Secondary | ICD-10-CM

## 2024-05-15 DIAGNOSIS — Z800 Family history of malignant neoplasm of digestive organs: Secondary | ICD-10-CM

## 2024-05-15 DIAGNOSIS — K7030 Alcoholic cirrhosis of liver without ascites: Secondary | ICD-10-CM

## 2024-05-15 DIAGNOSIS — K31A0 Gastric intestinal metaplasia, unspecified: Secondary | ICD-10-CM | POA: Diagnosis not present

## 2024-05-15 DIAGNOSIS — I8500 Esophageal varices without bleeding: Secondary | ICD-10-CM

## 2024-05-15 DIAGNOSIS — Z1211 Encounter for screening for malignant neoplasm of colon: Secondary | ICD-10-CM

## 2024-05-15 DIAGNOSIS — K635 Polyp of colon: Secondary | ICD-10-CM

## 2024-05-15 DIAGNOSIS — D120 Benign neoplasm of cecum: Secondary | ICD-10-CM

## 2024-05-15 DIAGNOSIS — K621 Rectal polyp: Secondary | ICD-10-CM | POA: Diagnosis not present

## 2024-05-15 DIAGNOSIS — D123 Benign neoplasm of transverse colon: Secondary | ICD-10-CM | POA: Diagnosis not present

## 2024-05-15 DIAGNOSIS — D128 Benign neoplasm of rectum: Secondary | ICD-10-CM | POA: Diagnosis not present

## 2024-05-15 DIAGNOSIS — I10 Essential (primary) hypertension: Secondary | ICD-10-CM | POA: Diagnosis not present

## 2024-05-15 DIAGNOSIS — K3189 Other diseases of stomach and duodenum: Secondary | ICD-10-CM

## 2024-05-15 DIAGNOSIS — D124 Benign neoplasm of descending colon: Secondary | ICD-10-CM

## 2024-05-15 DIAGNOSIS — Q438 Other specified congenital malformations of intestine: Secondary | ICD-10-CM | POA: Diagnosis not present

## 2024-05-15 DIAGNOSIS — J449 Chronic obstructive pulmonary disease, unspecified: Secondary | ICD-10-CM | POA: Diagnosis not present

## 2024-05-15 MED ORDER — SODIUM CHLORIDE 0.9 % IV SOLN: 500.0000 mL | Freq: Once | INTRAVENOUS | Status: DC

## 2024-05-15 NOTE — Progress Notes (Signed)
 [facility] History and Physical   Primary Care Physician:  Vicci Barnie NOVAK, MD   Reason for Procedure:   Colon cancer screening/family history of colon cancer; variceal screening  Plan:    EGD, colonoscopy     HPI: Angel [name] is a 68 y.o. female undergoing initial high risk screening colonoscopy and variceal screening.  Her mother was diagnosed with colon cancer at age 82.  She has no chronic GI symptoms.  She has recompensated alcohol-induced cirrhosis and is undergoing EGD for variceal screning   Past Medical History:  Diagnosis Date   Alcohol use 04/19/2022   Alcoholic cirrhosis (HCC)    Asthma    Bronchiolitis    Cirrhosis (HCC)    COPD (chronic obstructive pulmonary disease) (HCC)    Hypertension    Smoker 12/31/2013    Past Surgical History:  Procedure Laterality Date   ABDOMINAL HYSTERECTOMY     APPENDECTOMY     BACK SURGERY      Prior to Admission medications   Medication Sig Start Date End Date Taking? Authorizing Provider  Accu-Chek Softclix Lancets lancets Use as directed to check blood sugars once a day. 02/21/24  Yes Vicci Barnie NOVAK, MD  albuterol  (PROVENTIL ) (2.5 MG/3ML) 0.083% nebulizer solution Take 3 mLs (2.5 mg total) by nebulization every 4 (four) hours as needed. 12/24/23  Yes Darlean Ozell NOVAK, MD  albuterol  (VENTOLIN  HFA) 108 (90 Base) MCG/ACT inhaler Inhale 2 puffs into the lungs every 6 (six) hours as needed for wheezing or shortness of breath. 04/29/24  Yes Vicci Barnie NOVAK, MD  baclofen  (LIORESAL ) 10 MG tablet Take 1 tablet (10 mg total) by mouth 2 (two) times daily. 02/13/24  Yes Vicci Barnie NOVAK, MD  Blood Glucose Monitoring Suppl (ACCU-CHEK GUIDE) w/Device KIT Use as directed to check blood sugars once a day. 02/21/24  Yes Vicci Barnie NOVAK, MD  budeson-glycopyrrolate -formoterol  (BREZTRI  AEROSPHERE) 160-9-4.8 MCG/ACT AERO Inhale 2 puffs into the lungs in the morning and at bedtime. 12/24/23  Yes Darlean Ozell NOVAK, MD   budesonide -glycopyrrolate -formoterol  (BREZTRI  AEROSPHERE) 160-9-4.8 MCG/ACT AERO inhaler Inhale 2 puffs into the lungs 2 (two) times daily. 12/24/23  Yes Darlean Ozell NOVAK, MD  folic acid  (FOLVITE ) 1 MG tablet Take 1 tablet (1 mg total) by mouth daily. 04/29/24  Yes Vicci Barnie NOVAK, MD  furosemide  (LASIX ) 20 MG tablet Take 1 tablet (20 mg total) by mouth daily. 05/12/24  Yes Stacia Glendia BRAVO, MD  gabapentin  (NEURONTIN ) 100 MG capsule Take 2 capsules (200 mg total) by mouth 2 (two) times daily. 02/13/24  Yes Vicci Barnie NOVAK, MD  hydrOXYzine  (ATARAX ) 25 MG tablet Take 1 tablet (25 mg total) by mouth daily as needed for itching. 02/10/24  Yes Vicci Barnie NOVAK, MD  ibuprofen  (ADVIL ) 600 MG tablet Take 1 tablet (600 mg total) by mouth every 6 (six) hours as needed. 03/18/24  Yes Nivia Colon, PA-C  lactulose  (CHRONULAC ) 10 GM/15ML solution Take 15 mLs (10 g total) by mouth 2 (two) times daily. 05/12/24  Yes Stacia Glendia BRAVO, MD  Multiple Vitamins-Minerals (CENTRUM SILVER PO) Take 1 tablet by mouth daily.   Yes [provider]  omeprazole  (PRILOSEC) 20 MG capsule Take 1 capsule (20 mg total) by mouth daily. 11/22/23  Yes Vicci Barnie NOVAK, MD  OVER THE COUNTER MEDICATION Apply 1 application  topically 2 (two) times daily as needed (joint pain). Tiger Balm PO   Yes [provider]  spironolactone  (ALDACTONE ) 50 MG tablet Take 1 tablet (50 mg total) by  mouth daily. Please keep your upcoming appointment for further refills. Thank you 05/12/24  Yes Stacia Glendia BRAVO, MD  thiamine  (VITAMIN B1) 100 MG tablet Take 1 tablet (100 mg total) by mouth daily. 02/25/24  Yes Vicci Barnie NOVAK, MD  Trolamine Salicylate (ASPERCREME EX) Apply 1 Application topically See admin instructions. 1 application 3-4 times a day as needed for joint pain   Yes [provider]  Vitamin D , Ergocalciferol , (DRISDOL ) 1.25 MG (50000 UNIT) CAPS capsule Take 1 capsule (50,000 Units total) by mouth every 7 (seven)  days. 04/16/23  Yes Brien Belvie BRAVO, MD  clotrimazole  (MYCELEX ) 10 MG troche Take 1 tablet (10 mg total) by mouth 5 (five) times daily. Patient not taking: Reported on 05/15/2024 12/24/23   Darlean Ozell NOVAK, MD  glucose blood (ACCU-CHEK GUIDE TEST) test strip Use as directed to check blood sugars once a day. 02/21/24   Vicci Barnie NOVAK, MD    Current Outpatient Medications  Medication Sig Dispense Refill   Accu-Chek Softclix Lancets lancets Use as directed to check blood sugars once a day. 100 each 12   albuterol  (PROVENTIL ) (2.5 MG/3ML) 0.083% nebulizer solution Take 3 mLs (2.5 mg total) by nebulization every 4 (four) hours as needed. 75 mL 12   albuterol  (VENTOLIN  HFA) 108 (90 Base) MCG/ACT inhaler Inhale 2 puffs into the lungs every 6 (six) hours as needed for wheezing or shortness of breath. 54 g 1   baclofen  (LIORESAL ) 10 MG tablet Take 1 tablet (10 mg total) by mouth 2 (two) times daily. 180 each 3   Blood Glucose Monitoring Suppl (ACCU-CHEK GUIDE) w/Device KIT Use as directed to check blood sugars once a day. 1 kit 0   budeson-glycopyrrolate -formoterol  (BREZTRI  AEROSPHERE) 160-9-4.8 MCG/ACT AERO Inhale 2 puffs into the lungs in the morning and at bedtime.     budesonide -glycopyrrolate -formoterol  (BREZTRI  AEROSPHERE) 160-9-4.8 MCG/ACT AERO inhaler Inhale 2 puffs into the lungs 2 (two) times daily. 10.7 g 11   folic acid  (FOLVITE ) 1 MG tablet Take 1 tablet (1 mg total) by mouth daily. 90 tablet 1   furosemide  (LASIX ) 20 MG tablet Take 1 tablet (20 mg total) by mouth daily. 30 tablet 3   gabapentin  (NEURONTIN ) 100 MG capsule Take 2 capsules (200 mg total) by mouth 2 (two) times daily. 120 capsule 3   hydrOXYzine  (ATARAX ) 25 MG tablet Take 1 tablet (25 mg total) by mouth daily as needed for itching. 90 tablet 0   ibuprofen  (ADVIL ) 600 MG tablet Take 1 tablet (600 mg total) by mouth every 6 (six) hours as needed. 30 tablet 0   lactulose  (CHRONULAC ) 10 GM/15ML solution Take 15 mLs (10 g total) by  mouth 2 (two) times daily. 2365 mL 1   Multiple Vitamins-Minerals (CENTRUM SILVER PO) Take 1 tablet by mouth daily.     omeprazole  (PRILOSEC) 20 MG capsule Take 1 capsule (20 mg total) by mouth daily. 60 capsule 2   OVER THE COUNTER MEDICATION Apply 1 application  topically 2 (two) times daily as needed (joint pain). Tiger Balm PO     spironolactone  (ALDACTONE ) 50 MG tablet Take 1 tablet (50 mg total) by mouth daily. Please keep your upcoming appointment for further refills. Thank you 30 tablet 0   thiamine  (VITAMIN B1) 100 MG tablet Take 1 tablet (100 mg total) by mouth daily. 100 tablet 0   Trolamine Salicylate (ASPERCREME EX) Apply 1 Application topically See admin instructions. 1 application 3-4 times a day as needed for joint pain  Vitamin D , Ergocalciferol , (DRISDOL ) 1.25 MG (50000 UNIT) CAPS capsule Take 1 capsule (50,000 Units total) by mouth every 7 (seven) days. 12 capsule 5   clotrimazole  (MYCELEX ) 10 MG troche Take 1 tablet (10 mg total) by mouth 5 (five) times daily. (Patient not taking: Reported on 05/15/2024) 30 Troche 11   glucose blood (ACCU-CHEK GUIDE TEST) test strip Use as directed to check blood sugars once a day. 100 each 12   Current Facility-Administered Medications  Medication Dose Route Frequency Provider Last Rate Last Admin   0.9 %  sodium chloride  infusion  500 mL Intravenous Once Stacia Glendia BRAVO, MD        Allergies as of 05/15/2024 - Review Complete 05/15/2024  Allergen Reaction Noted   Penicillins Hives 12/30/2013   Metformin  and related Other (See Comments) 05/08/2024    Family History  Problem Relation Age of Onset   Breast cancer Mother    Colon cancer Mother    CAD Father    Heart disease Father    Breast cancer Sister    Emphysema Paternal Grandfather        never smoker, Theatre stage manager   Cancer Cousin        blood cancer, unknown type   Liver disease Neg Hx    Esophageal cancer Neg Hx     Social History   Socioeconomic History   Marital  status: Single    Spouse name: Not on file   Number of children: 0   Years of education: Not on file   Highest education level: Associate degree: academic program  Occupational History   Occupation: Unemployed  Tobacco Use   Smoking status: Every Day    Current packs/day: 0.00    Average packs/day: 0.5 packs/day for 30.0 years (15.0 ttl pk-yrs)    Types: Cigarettes    Start date: 12/31/1983    Last attempt to quit: 12/30/2013    Years since quitting: 10.3   Smokeless tobacco: Never  Vaping Use   Vaping status: Never Used  Substance and Sexual Activity   Alcohol use: Not Currently    Alcohol/week: 28.0 standard drinks of alcohol    Types: 28 Glasses of wine per week   Drug use: Yes    Frequency: 3.0 times per week    Types: Marijuana    Comment: 05/08/2024   Sexual activity: Not Currently  Other Topics Concern   Not on file  Social History Narrative   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   Now has her own Education officer, 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    Never pregnant, NO husband   Social drinkier    Quite smoking 3/15   Social Drivers of Corporate investment banker Strain: Low Risk  (11/14/2023)   Overall Financial Resource Strain (CARDIA)    Difficulty of Paying Living Expenses: Not very hard  Food Insecurity: No Food Insecurity (11/14/2023)   Hunger Vital Sign    Worried About Running Out of Food in the Last Year: Never true    Ran Out of Food in the Last Year: Never true  Recent Concern: Food Insecurity - Food Insecurity Present (10/12/2023)   Hunger Vital Sign    Worried About Running Out of Food in the Last Year: Often true    Ran Out of Food in the Last Year: Sometimes true  Transportation Needs: No Transportation Needs (11/14/2023)   PRAPARE - Administrator, Civil Service (Medical): No    Lack of Transportation (  Non-Medical): No  Physical Activity: Inactive (11/14/2023)   Exercise Vital Sign    Days of Exercise per Week: 0 days    Minutes of  Exercise per Session: 0 min  Stress: Stress Concern Present (11/14/2023)   [employer] of Occupational Health - Occupational Stress Questionnaire    Feeling of Stress : Rather much  Social Connections: Moderately Isolated (10/14/2023)   Social Connection and Isolation Panel    Frequency of Communication with Friends and Family: More than three times a week    Frequency of Social Gatherings with Friends and Family: Twice a week    Attends Religious Services: Never    Database administrator or Organizations: Yes    Attends Banker Meetings: Never    Marital Status: Divorced  Catering manager Violence: Not At Risk (11/14/2023)   Humiliation, Afraid, Rape, and Kick questionnaire    Fear of Current or Ex-Partner: No    Emotionally Abused: No    Physically Abused: No    Sexually Abused: No    Review of Systems:  All other review of systems negative except as mentioned in the HPI.  Physical Exam: Vital signs BP (!) 144/80   Pulse 83   Temp (!) 97.5 F (36.4 C) (Skin)   Ht 5' 6 (1.676 m)   Wt 167 lb (75.8 kg)   SpO2 94%   BMI 26.95 kg/m   General:   Alert,  Well-developed, well-nourished, pleasant and cooperative in NAD Airway:  Mallampati 3 Lungs:  Bilateral wheezing/rhonchi.   Heart:  Regular rate and rhythm; no murmurs, clicks, rubs,  or gallops. Abdomen:  Soft, nontender and nondistended. Normal bowel sounds.   Neuro/Psych:  Normal mood and affect. A and O x 3   Charisma Charlot E. Stacia, MD [facility]

## 2024-05-15 NOTE — Op Note (Signed)
 [facility] Patient Name: Angel [name] Procedure Date: 05/15/2024 11:27 AM MRN: [id number] Endoscopist: Glendia E. Stacia , MD, [id number] Age: 68 Referring MD:  Date of Birth: Aug 22, 1956 Gender: Female Account #: [id number] Procedure:                Upper GI endoscopy Indications:              Cirrhosis rule out esophageal varices Medicines:                Monitored Anesthesia Care Procedure:                Pre-Anesthesia Assessment:                           - Prior to the procedure, a History and Physical                            was performed, and patient medications and                            allergies were reviewed. The patient's tolerance of                            previous anesthesia was also reviewed. The risks                            and benefits of the procedure and the sedation                            options and risks were discussed with the patient.                            All questions were answered, and informed consent                            was obtained. Prior Anticoagulants: The patient has                            taken no anticoagulant or antiplatelet agents. ASA                            Grade Assessment: III - A patient with severe                            systemic disease. After reviewing the risks and                            benefits, the patient was deemed in satisfactory                            condition to undergo the procedure.                           After obtaining informed consent, the endoscope was  passed under direct vision. Throughout the                            procedure, the patient's blood pressure, pulse, and                            oxygen saturations were monitored continuously. The                            Olympus Scope SN [id number] was introduced through the                            mouth, and advanced to the second part of duodenum.                            The  upper GI endoscopy was accomplished without                            difficulty. The patient tolerated the procedure                            well. Scope In: Scope Out: Findings:                 Grade I varices were found in the lower third of                            the esophagus. They were small in size.                           The exam of the esophagus was otherwise normal.                           Mild portal hypertensive gastropathy was found in                            the gastric body. Biopsies were taken with a cold                            forceps for Helicobacter pylori testing. Estimated                            blood loss was minimal.                           The exam of the stomach was otherwise normal.                           Multiple small sessile polyps were found in the                            duodenal bulb. Biopsies were taken with a cold  forceps for histology. Estimated blood loss was                            minimal.                           The exam of the duodenum was otherwise normal. Complications:            No immediate complications. Estimated Blood Loss:     Estimated blood loss was minimal. Impression:               - Grade I esophageal varices. Low risk for                            bleeding. No intervention recommended.                           - Portal hypertensive gastropathy. Biopsied.                           - Multiple duodenal polyps. Biopsied. Recommendation:           - Patient has a contact number available for                            emergencies. The signs and symptoms of potential                            delayed complications were discussed with the                            patient. Return to normal activities tomorrow.                            Written discharge instructions were provided to the                            patient.                           - Resume previous diet.                            - Continue present medications.                           - Await pathology results.                           - Repeat upper endoscopy in 2 years for variceal                            surveillance. Trenity Pha E. Stacia, MD 05/15/2024 12:34:42 PM This report has been signed electronically.

## 2024-05-15 NOTE — Patient Instructions (Addendum)
 Resume previous diet and medications.  Handout provided on polyps.  Education attached.  Biopsy results will be sent via MyChart or letter along with additional recommendations.  Repeat EGD in 2 years and repeat colon (date not yet determined) for surveillance based on pathology results.  YOU HAD AN ENDOSCOPIC PROCEDURE TODAY AT THE [facility]:   Refer to the procedure report that was given to you for any specific questions about what was found during the examination.  If the procedure report does not answer your questions, please call your gastroenterologist to clarify.  If you requested that your care partner not be given the details of your procedure findings, then the procedure report has been included in a sealed envelope for you to review at your convenience later.  YOU SHOULD EXPECT: Some feelings of bloating in the abdomen. Passage of more gas than usual.  Walking can help get rid of the air that was put into your GI tract during the procedure and reduce the bloating. If you had a lower endoscopy (such as a colonoscopy or flexible sigmoidoscopy) you may notice spotting of blood in your stool or on the toilet paper. If you underwent a bowel prep for your procedure, you may not have a normal bowel movement for a few days.  Please Note:  You might notice some irritation and congestion in your nose or some drainage.  This is from the oxygen used during your procedure.  There is no need for concern and it should clear up in a day or so.  SYMPTOMS TO REPORT IMMEDIATELY:  Following lower endoscopy (colonoscopy or flexible sigmoidoscopy):  Excessive amounts of blood in the stool  Significant tenderness or worsening of abdominal pains  Swelling of the abdomen that is new, acute  Fever of 100F or higher  Following upper endoscopy (EGD)  Vomiting of blood or coffee ground material  New chest pain or pain under the shoulder blades  Painful or persistently difficult swallowing  New  shortness of breath  Fever of 100F or higher  Black, tarry-looking stools  For urgent or emergent issues, a gastroenterologist can be reached at any hour by calling [phone]. Do not use MyChart messaging for urgent concerns.    DIET:  We do recommend a small meal at first, but then you may proceed to your regular diet.  Drink plenty of fluids but you should avoid alcoholic beverages for 24 hours.  ACTIVITY:  You should plan to take it easy for the rest of today and you should NOT DRIVE or use heavy machinery until tomorrow (because of the sedation medicines used during the test).    FOLLOW UP: Our staff will call the number listed on your records the next business day following your procedure.  We will call around 7:15- 8:00 am to check on you and address any questions or concerns that you may have regarding the information given to you following your procedure. If we do not reach you, we will leave a message.     If any biopsies were taken you will be contacted by phone or by letter within the next 1-3 weeks.  Please call us  at [phone] if you have not heard about the biopsies in 3 weeks.    SIGNATURES/CONFIDENTIALITY: You and/or your care partner have signed paperwork which will be entered into your electronic medical record.  These signatures attest to the fact that that the information above on your After Visit Summary has been reviewed and is understood.  Full responsibility of the confidentiality of this discharge information lies with you and/or your care-partner.

## 2024-05-15 NOTE — Progress Notes (Signed)
 Called to room to assist during endoscopic procedure.  Patient ID and intended procedure confirmed with present staff. Received instructions for my participation in the procedure from the performing physician.

## 2024-05-15 NOTE — Progress Notes (Signed)
 Pt's states no medical or surgical changes since previsit or office visit.

## 2024-05-15 NOTE — Progress Notes (Signed)
 Report to PACU, RN, vss, BBS= Clear.

## 2024-05-15 NOTE — Op Note (Signed)
 [facility] Patient Name: Tolulope Pinkett Procedure Date: 05/15/2024 11:26 AM MRN: [id number] Endoscopist: Glendia E. Stacia , MD, [id number] Age: 68 Referring MD:  Date of Birth: [birth year] Gender: Female Account #: [id number] Procedure:                Colonoscopy Indications:              Screening in patient at increased risk: Colorectal                            cancer in mother before age 33 Medicines:                Monitored Anesthesia Care Procedure:                Pre-Anesthesia Assessment:                           - Prior to the procedure, a History and Physical                            was performed, and patient medications and                            allergies were reviewed. The patient's tolerance of                            previous anesthesia was also reviewed. The risks                            and benefits of the procedure and the sedation                            options and risks were discussed with the patient.                            All questions were answered, and informed consent                            was obtained. Prior Anticoagulants: The patient has                            taken no anticoagulant or antiplatelet agents. ASA                            Grade Assessment: III - A patient with severe                            systemic disease. After reviewing the risks and                            benefits, the patient was deemed in satisfactory                            condition to undergo the procedure.  After obtaining informed consent, the colonoscope                            was passed under direct vision. Throughout the                            procedure, the patient's blood pressure, pulse, and                            oxygen saturations were monitored continuously. The                            Olympus Scope SN: [id number] was introduced through                            the anus and  advanced to the the cecum, identified                            by appendiceal orifice and ileocecal valve. The                            colonoscopy was somewhat difficult due to a                            tortuous colon. Successful completion of the                            procedure was aided by using manual pressure. The                            patient tolerated the procedure well. The quality                            of the bowel preparation was adequate. The                            ileocecal valve, appendiceal orifice, and rectum                            were photographed. The bowel preparation used was                            SUPREP via split dose instruction. Scope In: 11:53:18 AM Scope Out: 12:28:00 PM Scope Withdrawal Time: 0 hours 27 minutes 56 seconds  Total Procedure Duration: 0 hours 34 minutes 42 seconds  Findings:                 The perianal and digital rectal examinations were                            normal. Pertinent negatives include normal                            sphincter tone and no palpable rectal lesions.  A 5 mm polyp was found in the cecum. The polyp was                            sessile. The polyp was removed with a cold snare.                            Resection and retrieval were complete. Estimated                            blood loss was minimal.                           Three sessile polyps were found in the transverse                            colon. The polyps were 4 to 8 mm in size. These                            polyps were removed with a cold snare. Resection                            and retrieval were complete. Estimated blood loss                            was minimal.                           A 7 mm polyp was found in the descending colon. The                            polyp was sessile. The polyp was removed with a                            cold snare. Resection and retrieval were  complete.                            Estimated blood loss was minimal.                           Two sessile polyps were found in the rectum. The                            polyps were 2 to 3 mm in size. These polyps were                            removed with a cold snare. Resection and retrieval                            were complete. Estimated blood loss was minimal.                           There was a medium-sized lipoma, 15 mm in diameter,  in the ascending colon.                           The exam was otherwise normal throughout the                            examined colon.                           The retroflexed view of the distal rectum and anal                            verge was normal and showed no anal or rectal                            abnormalities. Complications:            No immediate complications. Estimated Blood Loss:     Estimated blood loss was minimal. Impression:               - One 5 mm polyp in the cecum, removed with a cold                            snare. Resected and retrieved.                           - Three 4 to 8 mm polyps in the transverse colon,                            removed with a cold snare. Resected and retrieved.                           - One 7 mm polyp in the descending colon, removed                            with a cold snare. Resected and retrieved.                           - Two 2 to 3 mm polyps in the rectum, removed with                            a cold snare. Resected and retrieved.                           - Medium-sized lipoma in the ascending colon.                           - The distal rectum and anal verge are normal on                            retroflexion view. Recommendation:           - Patient has a contact number available for  emergencies. The signs and symptoms of potential                            delayed complications were discussed with the                             patient. Return to normal activities tomorrow.                            Written discharge instructions were provided to the                            patient.                           - Resume previous diet.                           - Continue present medications.                           - Await pathology results.                           - Repeat colonoscopy (date not yet determined) for                            surveillance based on pathology results. Tannie Koskela E. Stacia, MD 05/15/2024 12:42:50 PM This report has been signed electronically.

## 2024-05-18 ENCOUNTER — Telehealth: Payer: Self-pay | Admitting: *Deleted

## 2024-05-18 NOTE — Telephone Encounter (Signed)
 Unable to leave mess on f/u call

## 2024-05-18 NOTE — Telephone Encounter (Signed)
Unable to leave mess

## 2024-05-19 LAB — SURGICAL PATHOLOGY

## 2024-05-22 ENCOUNTER — Ambulatory Visit: Payer: Self-pay | Admitting: Gastroenterology

## 2024-05-22 DIAGNOSIS — K7030 Alcoholic cirrhosis of liver without ascites: Secondary | ICD-10-CM

## 2024-05-22 DIAGNOSIS — K754 Autoimmune hepatitis: Secondary | ICD-10-CM

## 2024-05-22 NOTE — Progress Notes (Signed)
 Angel [name],  The biopsies of the polypoid growths in your small bowel showed reactive/inflammatory changes typically related to stomach acid exposure.  These are generally not considered precancerous.  Will reassess in 2 years. The biopsies of your stomach were unremarkable.  No evidence of H pylori infection  Three of the seven polyps that I removed from your colon were completely benign but were proven to be pre-cancerous polyps that MAY have grown into cancers if they had not been removed.  The other four polyps were not precancerous.  Studies shows that at least 20% of women over age 57 and 30% of men over age 27 have pre-cancerous polyps.  Based on current nationally recognized surveillance guidelines, I recommend that you have a repeat colonoscopy in 5 years.   Also, I would like to repeat labs given the abrupt worsening of your liver and kidney function noted earlier this year.  Please come to the lab next week for repeat blood tests.  Team,  Please order CMP, GGT, IgG level and INR for next week

## 2024-05-25 ENCOUNTER — Ambulatory Visit (HOSPITAL_COMMUNITY)
Admission: RE | Admit: 2024-05-25 | Discharge: 2024-05-25 | Disposition: A | Discharge status: Home / Self Care | Source: Ambulatory Visit | Attending: Physician Assistant | Admitting: Physician Assistant

## 2024-05-25 ENCOUNTER — Other Ambulatory Visit: Payer: Self-pay

## 2024-05-25 DIAGNOSIS — K7030 Alcoholic cirrhosis of liver without ascites: Secondary | ICD-10-CM | POA: Insufficient documentation

## 2024-05-25 DIAGNOSIS — K8020 Calculus of gallbladder without cholecystitis without obstruction: Secondary | ICD-10-CM | POA: Diagnosis not present

## 2024-05-25 DIAGNOSIS — K7460 Unspecified cirrhosis of liver: Secondary | ICD-10-CM | POA: Diagnosis not present

## 2024-05-26 ENCOUNTER — Encounter: Payer: Self-pay | Admitting: Gastroenterology

## 2024-05-26 ENCOUNTER — Other Ambulatory Visit: Payer: Self-pay | Admitting: Critical Care Medicine

## 2024-05-26 ENCOUNTER — Other Ambulatory Visit: Payer: Self-pay

## 2024-05-28 ENCOUNTER — Other Ambulatory Visit (INDEPENDENT_AMBULATORY_CARE_PROVIDER_SITE_OTHER)

## 2024-05-28 ENCOUNTER — Other Ambulatory Visit: Payer: Self-pay

## 2024-05-28 DIAGNOSIS — K754 Autoimmune hepatitis: Secondary | ICD-10-CM

## 2024-05-28 DIAGNOSIS — K7030 Alcoholic cirrhosis of liver without ascites: Secondary | ICD-10-CM | POA: Diagnosis not present

## 2024-05-28 LAB — GAMMA GT: GGT: 45 U/L (ref 7–51)

## 2024-05-28 LAB — COMPREHENSIVE METABOLIC PANEL WITH GFR
ALT: 63 U/L — ABNORMAL HIGH (ref 0–35)
AST: 65 U/L — ABNORMAL HIGH (ref 0–37)
Albumin: 3.9 g/dL (ref 3.5–5.2)
Alkaline Phosphatase: 145 U/L — ABNORMAL HIGH (ref 39–117)
BUN: 17 mg/dL (ref 6–23)
CO2: 25 meq/L (ref 19–32)
Calcium: 9.4 mg/dL (ref 8.4–10.5)
Chloride: 102 meq/L (ref 96–112)
Creatinine, Ser: 0.9 mg/dL (ref 0.40–1.20)
GFR: 66.96 mL/min (ref >60.00)
Glucose, Bld: 78 mg/dL (ref 70–99)
Potassium: 4.1 meq/L (ref 3.5–5.1)
Sodium: 136 meq/L (ref 135–145)
Total Bilirubin: 0.5 mg/dL (ref 0.2–1.2)
Total Protein: 8.5 g/dL — ABNORMAL HIGH (ref 6.0–8.3)

## 2024-05-28 LAB — PROTIME-INR
INR: 1.2 ratio — ABNORMAL HIGH (ref 0.8–1.0)
Prothrombin Time: 13 s (ref 9.6–13.1)

## 2024-05-29 LAB — IGG: IgG (Immunoglobin G), Serum: 2329 mg/dL — ABNORMAL HIGH (ref 600–1540)

## 2024-06-01 ENCOUNTER — Ambulatory Visit (INDEPENDENT_AMBULATORY_CARE_PROVIDER_SITE_OTHER): Admitting: Diagnostic Neuroimaging

## 2024-06-01 ENCOUNTER — Encounter: Payer: Self-pay | Admitting: Diagnostic Neuroimaging

## 2024-06-01 VITALS — BP 120/80 | HR 82 | Ht 66.0 in | Wt 167.2 lb

## 2024-06-01 DIAGNOSIS — M79672 Pain in left foot: Secondary | ICD-10-CM

## 2024-06-01 DIAGNOSIS — E790 Hyperuricemia without signs of inflammatory arthritis and tophaceous disease: Secondary | ICD-10-CM

## 2024-06-01 DIAGNOSIS — M79671 Pain in right foot: Secondary | ICD-10-CM | POA: Diagnosis not present

## 2024-06-01 DIAGNOSIS — R768 Other specified abnormal immunological findings in serum: Secondary | ICD-10-CM

## 2024-06-01 DIAGNOSIS — E1142 Type 2 diabetes mellitus with diabetic polyneuropathy: Secondary | ICD-10-CM | POA: Diagnosis not present

## 2024-06-01 NOTE — Patient Instructions (Signed)
  Painful feet / cramps / numbness / tingling (since ~2023; related to elevated A1c, elevated uric acid, positive ANA, chronic alcohol abuse [quit in 2023]) - continue treatment of underlying conditions above per PCP and rheumatology - increase gabapentin  as tolerated  Other painful neuropathy treatment options: -duloxetine 30-60mg  daily, amitriptyline 25-50mg  at bedtime, gabapentin  100-300mg  three times a day, pregabalin 75-150mg  twice a day -capsaicin cream, lidocaine  patch / cream, alpha-lipoic acid 600mg  daily

## 2024-06-01 NOTE — Progress Notes (Signed)
 [facility]  PATIENT: Angel [name] DOB: 05/24/1956  REFERRING CLINICIAN: Vicci Barnie NOVAK, MD HISTORY FROM: patient  REASON FOR VISIT: new consult   HISTORICAL  CHIEF COMPLAINT:  Chief Complaint  Patient presents with   New Patient (Initial Visit)    RM 6, Pt alone, referred by PCP for peripheral polyneuropathy. Pt states had labs with ortho and will see rheumatology is Oct.     HISTORY OF PRESENT ILLNESS:   68 year old female with history of alcohol abuse and cirrhosis, COPD, hypertension, tobacco abuse, here for evaluation of neuropathy.  Patient reports cramps, numbness, tingling in the feet and legs since 2023.  Around a time she was also diagnosed with cirrhosis of liver.  She stopped drinking alcohol in July 2023.  Unfortunately symptoms continue to worsen over time.  Has been diagnosed with elevated blood sugar, elevated uric acid, positive ANA and rheumatoid factor.  Has been prescribed gabapentin  20 mg twice a day for pain relief without benefit yet.    REVIEW OF SYSTEMS: Full 14 system review of systems performed and negative with exception of: as per HPI.  ALLERGIES: Allergies  Allergen Reactions   Penicillins Hives   Metformin  And Related Other (See Comments)    Nausea, diarrhea, dizzy    HOME MEDICATIONS: Outpatient Medications Prior to Visit  Medication Sig Dispense Refill   albuterol  (PROVENTIL ) (2.5 MG/3ML) 0.083% nebulizer solution Take 3 mLs (2.5 mg total) by nebulization every 4 (four) hours as needed. 75 mL 12   albuterol  (VENTOLIN  HFA) 108 (90 Base) MCG/ACT inhaler Inhale 2 puffs into the lungs every 6 (six) hours as needed for wheezing or shortness of breath. 54 g 1   baclofen  (LIORESAL ) 10 MG tablet Take 1 tablet (10 mg total) by mouth 2 (two) times daily. 180 each 3   budeson-glycopyrrolate -formoterol  (BREZTRI  AEROSPHERE) 160-9-4.8 MCG/ACT AERO Inhale 2 puffs into the lungs in the morning and at bedtime.      budesonide -glycopyrrolate -formoterol  (BREZTRI  AEROSPHERE) 160-9-4.8 MCG/ACT AERO inhaler Inhale 2 puffs into the lungs 2 (two) times daily. 10.7 g 11   folic acid  (FOLVITE ) 1 MG tablet Take 1 tablet (1 mg total) by mouth daily. 90 tablet 1   furosemide  (LASIX ) 20 MG tablet Take 1 tablet (20 mg total) by mouth daily. 30 tablet 3   gabapentin  (NEURONTIN ) 100 MG capsule Take 2 capsules (200 mg total) by mouth 2 (two) times daily. 120 capsule 3   hydrOXYzine  (ATARAX ) 25 MG tablet Take 1 tablet (25 mg total) by mouth daily as needed for itching. 90 tablet 0   lactulose  (CHRONULAC ) 10 GM/15ML solution Take 15 mLs (10 g total) by mouth 2 (two) times daily. 2365 mL 1   Multiple Vitamins-Minerals (CENTRUM SILVER PO) Take 1 tablet by mouth daily.     omeprazole  (PRILOSEC) 20 MG capsule Take 1 capsule (20 mg total) by mouth daily. 60 capsule 2   OVER THE COUNTER MEDICATION Apply 1 application  topically 2 (two) times daily as needed (joint pain). Tiger Balm PO     spironolactone  (ALDACTONE ) 50 MG tablet Take 1 tablet (50 mg total) by mouth daily. Please keep your upcoming appointment for further refills. Thank you 30 tablet 0   thiamine  (VITAMIN B1) 100 MG tablet Take 1 tablet (100 mg total) by mouth daily. 100 tablet 0   Trolamine Salicylate (ASPERCREME EX) Apply 1 Application topically See admin instructions. 1 application 3-4 times a day as needed for joint pain     Vitamin D , Ergocalciferol , (  DRISDOL ) 1.25 MG (50000 UNIT) CAPS capsule Take 1 capsule (50,000 Units total) by mouth every 7 (seven) days. 12 capsule 5   Accu-Chek Softclix Lancets lancets Use as directed to check blood sugars once a day. (Patient not taking: Reported on 06/01/2024) 100 each 12   Blood Glucose Monitoring Suppl (ACCU-CHEK GUIDE) w/Device KIT Use as directed to check blood sugars once a day. (Patient not taking: Reported on 06/01/2024) 1 kit 0   glucose blood (ACCU-CHEK GUIDE TEST) test strip Use as directed to check blood sugars once  a day. (Patient not taking: Reported on 06/01/2024) 100 each 12   clotrimazole  (MYCELEX ) 10 MG troche Take 1 tablet (10 mg total) by mouth 5 (five) times daily. (Patient not taking: Reported on 05/15/2024) 30 Troche 11   ibuprofen  (ADVIL ) 600 MG tablet Take 1 tablet (600 mg total) by mouth every 6 (six) hours as needed. 30 tablet 0   No facility-administered medications prior to visit.    PAST MEDICAL HISTORY: Past Medical History:  Diagnosis Date   Alcohol use 04/19/2022   Alcoholic cirrhosis (HCC)    Asthma    Bronchiolitis    Cirrhosis (HCC)    COPD (chronic obstructive pulmonary disease) (HCC)    Hypertension    Smoker 12/31/2013    PAST SURGICAL HISTORY: Past Surgical History:  Procedure Laterality Date   ABDOMINAL HYSTERECTOMY     APPENDECTOMY     BACK SURGERY      FAMILY HISTORY: Family History  Problem Relation Age of Onset   Breast cancer Mother    Colon cancer Mother    CAD Father    Heart disease Father    Breast cancer Sister    Emphysema Paternal Grandfather        never smoker, Theatre stage manager   Cancer Cousin        blood cancer, unknown type   Liver disease Neg Hx    Esophageal cancer Neg Hx     SOCIAL HISTORY: Social History   Socioeconomic History   Marital status: Single    Spouse name: Not on file   Number of children: 0   Years of education: Not on file   Highest education level: Associate degree: academic program  Occupational History   Occupation: Unemployed  Tobacco Use   Smoking status: Every Day    Current packs/day: 0.00    Average packs/day: 0.5 packs/day for 30.0 years (15.0 ttl pk-yrs)    Types: Cigarettes    Start date: 12/31/1983    Last attempt to quit: 12/30/2013    Years since quitting: 10.4   Smokeless tobacco: Never  Vaping Use   Vaping status: Never Used  Substance and Sexual Activity   Alcohol use: Not Currently    Alcohol/week: 28.0 standard drinks of alcohol    Types: 28 Glasses of wine per week   Drug use: Yes     Frequency: 3.0 times per week    Types: Marijuana    Comment: 05/08/2024 THC gummies   Sexual activity: Not Currently  Other Topics Concern   Not on file  Social History Narrative   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   Now has her own Education officer, 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    Never pregnant, NO husband   Social drinkier    Quite smoking 3/15   Social Drivers of Health   Financial Resource Strain: Low Risk  (11/14/2023)   Overall Financial Resource Strain (CARDIA)    Difficulty of Paying  Living Expenses: Not very hard  Food Insecurity: No Food Insecurity (11/14/2023)   Hunger Vital Sign    Worried About Running Out of Food in the Last Year: Never true    Ran Out of Food in the Last Year: Never true  Recent Concern: Food Insecurity - Food Insecurity Present (10/12/2023)   Hunger Vital Sign    Worried About Running Out of Food in the Last Year: Often true    Ran Out of Food in the Last Year: Sometimes true  Transportation Needs: No Transportation Needs (11/14/2023)   PRAPARE - Administrator, Civil Service (Medical): No    Lack of Transportation (Non-Medical): No  Physical Activity: Inactive (11/14/2023)   Exercise Vital Sign    Days of Exercise per Week: 0 days    Minutes of Exercise per Session: 0 min  Stress: Stress Concern Present (11/14/2023)   [employer] of Occupational Health - Occupational Stress Questionnaire    Feeling of Stress : Rather much  Social Connections: Moderately Isolated (10/14/2023)   Social Connection and Isolation Panel    Frequency of Communication with Friends and Family: More than three times a week    Frequency of Social Gatherings with Friends and Family: Twice a week    Attends Religious Services: Never    Database administrator or Organizations: Yes    Attends Banker Meetings: Never    Marital Status: Divorced  Catering manager Violence: Not At Risk (11/14/2023)   Humiliation, Afraid, Rape, and Kick  questionnaire    Fear of Current or Ex-Partner: No    Emotionally Abused: No    Physically Abused: No    Sexually Abused: No     PHYSICAL EXAM  GENERAL EXAM/CONSTITUTIONAL: Vitals:  Vitals:   06/01/24 0907  BP: 120/80  Pulse: 82  SpO2: 95%  Weight: 167 lb 3.2 oz (75.8 kg)  Height: 5' 6 (1.676 m)   Body mass index is 26.99 kg/m. Wt Readings from Last 3 Encounters:  06/01/24 167 lb 3.2 oz (75.8 kg)  05/15/24 167 lb (75.8 kg)  05/08/24 167 lb (75.8 kg)   Patient is in no distress; well developed, nourished and groomed; neck is supple  CARDIOVASCULAR: Examination of carotid arteries is normal; no carotid bruits Regular rate and rhythm, no murmurs Examination of peripheral vascular system by observation and palpation is normal  EYES: Ophthalmoscopic exam of optic discs and posterior segments is normal; no papilledema or hemorrhages No results found.  MUSCULOSKELETAL: Gait, strength, tone, movements noted in Neurologic exam below  NEUROLOGIC: MENTAL STATUS:      No data to display         awake, alert, oriented to person, place and time recent and remote memory intact normal attention and concentration language fluent, comprehension intact, naming intact fund of knowledge appropriate  CRANIAL NERVE:  2nd - no papilledema on fundoscopic exam 2nd, 3rd, 4th, 6th - pupils equal and reactive to light, visual fields full to confrontation, extraocular muscles intact, no nystagmus 5th - facial sensation symmetric 7th - facial strength symmetric 8th - hearing intact 9th - palate elevates symmetrically, uvula midline 11th - shoulder shrug symmetric 12th - tongue protrusion midline  MOTOR:  normal bulk and tone, full strength in the BUE, BLE  SENSORY:  normal and symmetric to light touch, temperature, vibration; EXCEPT DECR IN LEFT FOOT  COORDINATION:  finger-nose-finger, fine finger movements normal  REFLEXES:  deep tendon reflexes TRACE and  symmetric  GAIT/STATION:  narrow based  gait     DIAGNOSTIC DATA (LABS, IMAGING, TESTING) - I reviewed patient records, labs, notes, testing and imaging myself where available.  Lab Results  Component Value Date   WBC 6.9 05/08/2024   HGB 14.2 05/08/2024   HCT 42.5 05/08/2024   MCV 87.6 05/08/2024   PLT 174.0 05/08/2024      Component Value Date/Time   NA 136 05/28/2024 1544   NA 141 02/13/2024 1426   K 4.1 05/28/2024 1544   CL 102 05/28/2024 1544   CO2 25 05/28/2024 1544   GLUCOSE 78 05/28/2024 1544   BUN 17 05/28/2024 1544   BUN 17 02/13/2024 1426   CREATININE 0.90 05/28/2024 1544   CREATININE 0.79 02/19/2014 1226   CALCIUM  9.4 05/28/2024 1544   PROT 8.5 (H) 05/28/2024 1544   PROT 7.7 06/04/2023 1411   ALBUMIN 3.9 05/28/2024 1544   ALBUMIN 4.3 06/04/2023 1411   AST 65 (H) 05/28/2024 1544   ALT 63 (H) 05/28/2024 1544   ALKPHOS 145 (H) 05/28/2024 1544   BILITOT 0.5 05/28/2024 1544   BILITOT 0.3 06/04/2023 1411   GFRNONAA >60 08/25/2023 1214   GFRNONAA 85 02/19/2014 1226   GFRAA >60 04/17/2019 0514   GFRAA >89 02/19/2014 1226   Lab Results  Component Value Date   CHOL 121 04/19/2022   HDL 36 (L) 04/19/2022   LDLCALC 72 04/19/2022   TRIG 62 04/19/2022   CHOLHDL 3.4 04/19/2022   Lab Results  Component Value Date   HGBA1C 6.9 (H) 02/13/2024   Lab Results  Component Value Date   VITAMINB12 482 10/16/2023   Lab Results  Component Value Date   TSH 3.230 04/19/2022   Lab Results  Component Value Date   LABURIC 12.1 (H) 02/25/2024   Lab Results  Component Value Date   ANA POSITIVE (A) 02/25/2024     ASSESSMENT AND PLAN  68 y.o. year old female here with:  Dx:  1. Diabetic polyneuropathy associated with type 2 diabetes mellitus (HCC)   2. Elevated uric acid in blood   3. Positive ANA (antinuclear antibody)   4. Bilateral foot pain     PLAN:  Painful feet / cramps / numbness / tingling (since ~2023; related to elevated A1c, elevated uric  acid, positive ANA, chronic alcohol abuse [quit in 2023]) - continue treatment of underlying conditions above per PCP and rheumatology - increase gabapentin  as tolerated  Other painful neuropathy treatment options: -duloxetine 30-60mg  daily, amitriptyline 25-50mg  at bedtime, gabapentin  100-300mg  three times a day, pregabalin 75-150mg  twice a day -capsaicin cream, lidocaine  patch / cream, alpha-lipoic acid 600mg  daily  Return for return to PCP.    EDUARD FABIENE HANLON, MD 06/01/2024, 10:03 AM Certified in Neurology, Neurophysiology and Neuroimaging  [facility] [address] [place], [place] [address] [phone]

## 2024-06-02 ENCOUNTER — Ambulatory Visit: Payer: Self-pay | Admitting: Physician Assistant

## 2024-06-07 ENCOUNTER — Other Ambulatory Visit: Payer: Self-pay | Admitting: Gastroenterology

## 2024-06-07 ENCOUNTER — Other Ambulatory Visit: Payer: Self-pay | Admitting: Internal Medicine

## 2024-06-08 MED ORDER — OMEPRAZOLE 20 MG PO CPDR
20.0000 mg | DELAYED_RELEASE_CAPSULE | Freq: Every day | ORAL | 0 refills | Status: DC
  Filled 2024-06-08: qty 30, 30d supply, fill #0

## 2024-06-08 MED ORDER — THIAMINE HCL 100 MG PO TABS
100.0000 mg | ORAL_TABLET | Freq: Every day | ORAL | 0 refills | Status: DC
  Filled 2024-06-08: qty 30, 30d supply, fill #0

## 2024-06-08 MED ORDER — HYDROXYZINE HCL 25 MG PO TABS
25.0000 mg | ORAL_TABLET | Freq: Every day | ORAL | 0 refills | Status: DC | PRN
  Filled 2024-06-08: qty 30, 30d supply, fill #0

## 2024-06-09 ENCOUNTER — Other Ambulatory Visit: Payer: Self-pay

## 2024-06-09 MED ORDER — SPIRONOLACTONE 50 MG PO TABS
50.0000 mg | ORAL_TABLET | Freq: Every day | ORAL | 2 refills | Status: DC
  Filled 2024-06-09: qty 30, 30d supply, fill #0
  Filled 2024-07-16: qty 30, 30d supply, fill #1
  Filled 2024-08-21: qty 30, 30d supply, fill #2

## 2024-06-12 ENCOUNTER — Telehealth: Payer: Self-pay | Admitting: Internal Medicine

## 2024-06-12 ENCOUNTER — Other Ambulatory Visit: Payer: Self-pay

## 2024-06-12 NOTE — Telephone Encounter (Signed)
 Called patient to confirm upcoming appointment 06/15/2024. Patient appointment has been successfully confirmed

## 2024-06-15 ENCOUNTER — Encounter: Payer: Self-pay | Admitting: Internal Medicine

## 2024-06-15 ENCOUNTER — Other Ambulatory Visit: Payer: Self-pay

## 2024-06-15 ENCOUNTER — Ambulatory Visit: Admitting: Neurology

## 2024-06-15 ENCOUNTER — Ambulatory Visit: Attending: Internal Medicine | Admitting: Internal Medicine

## 2024-06-15 VITALS — BP 115/67 | HR 79 | Temp 97.9°F | Ht 66.0 in | Wt 166.0 lb

## 2024-06-15 DIAGNOSIS — G629 Polyneuropathy, unspecified: Secondary | ICD-10-CM | POA: Diagnosis not present

## 2024-06-15 DIAGNOSIS — Z794 Long term (current) use of insulin: Secondary | ICD-10-CM | POA: Diagnosis not present

## 2024-06-15 DIAGNOSIS — Z23 Encounter for immunization: Secondary | ICD-10-CM

## 2024-06-15 DIAGNOSIS — E118 Type 2 diabetes mellitus with unspecified complications: Secondary | ICD-10-CM | POA: Diagnosis not present

## 2024-06-15 DIAGNOSIS — F17200 Nicotine dependence, unspecified, uncomplicated: Secondary | ICD-10-CM

## 2024-06-15 DIAGNOSIS — Z1331 Encounter for screening for depression: Secondary | ICD-10-CM

## 2024-06-15 DIAGNOSIS — Z79899 Other long term (current) drug therapy: Secondary | ICD-10-CM | POA: Diagnosis not present

## 2024-06-15 DIAGNOSIS — I8510 Secondary esophageal varices without bleeding: Secondary | ICD-10-CM | POA: Insufficient documentation

## 2024-06-15 DIAGNOSIS — J449 Chronic obstructive pulmonary disease, unspecified: Secondary | ICD-10-CM

## 2024-06-15 DIAGNOSIS — K7030 Alcoholic cirrhosis of liver without ascites: Secondary | ICD-10-CM

## 2024-06-15 DIAGNOSIS — Z789 Other specified health status: Secondary | ICD-10-CM | POA: Diagnosis not present

## 2024-06-15 LAB — POCT GLYCOSYLATED HEMOGLOBIN (HGB A1C): HbA1c, POC (controlled diabetic range): 6.9 % (ref 0.0–7.0)

## 2024-06-15 LAB — GLUCOSE, POCT (MANUAL RESULT ENTRY): POC Glucose: 156 mg/dL — AB (ref 70–99)

## 2024-06-15 MED ORDER — CAPSAICIN 0.035 % EX CREA
TOPICAL_CREAM | CUTANEOUS | 0 refills | Status: DC
  Filled 2024-06-15 – 2024-07-20 (×3): qty 100, fill #0

## 2024-06-15 MED ORDER — OMEPRAZOLE 20 MG PO CPDR
20.0000 mg | DELAYED_RELEASE_CAPSULE | Freq: Every day | ORAL | 1 refills | Status: DC
  Filled 2024-06-15 – 2024-07-07 (×2): qty 90, 90d supply, fill #0
  Filled 2024-10-03: qty 90, 90d supply, fill #1

## 2024-06-15 MED ORDER — HYDROXYZINE HCL 25 MG PO TABS
25.0000 mg | ORAL_TABLET | Freq: Every day | ORAL | 0 refills | Status: DC | PRN
  Filled 2024-06-15 – 2024-07-07 (×2): qty 30, 30d supply, fill #0

## 2024-06-15 MED ORDER — GABAPENTIN 100 MG PO CAPS
200.0000 mg | ORAL_CAPSULE | Freq: Two times a day (BID) | ORAL | 3 refills | Status: DC
  Filled 2024-06-15 – 2024-07-07 (×2): qty 120, 30d supply, fill #0
  Filled 2024-07-30: qty 120, 30d supply, fill #1
  Filled 2024-08-28: qty 120, 30d supply, fill #2

## 2024-06-15 NOTE — Progress Notes (Signed)
 Patient ID: Angel [name], female    DOB: 1955-10-16  MRN: [id number]  CC: Diabetes (DM & HTN f/u. /On-going pain with bilateral leg & feet - neurologist to send suggestions to PCP/Reports unable to take warfarin due to nausea & dizziness /Brown spot on R side of chest, growing in size Janiece to flu vax)   Subjective: Angel [name] is a 68 y.o. female who presents for chronic ds management. Her concerns today include:  Patient with history of HTN, DM,aortic atherosclerosis, osteopenia, tobacco dependence, COPD/bronchiectasis, EtOH cirrhosis, HL, vitamin D  deficiency, malnutrition, osteopenia, strong fhx of RA   Discussed the use of AI scribe software for clinical note transcription with the patient, who gave verbal consent to proceed.  History of Present Illness Angel [name] is a 68 year old female with diabetes and COPD who presents for a follow-up visit.  DM: Results for orders placed or performed in visit on 06/15/24  POCT glucose (manual entry)   Collection Time: 06/15/24  1:57 PM  Result Value Ref Range   POC Glucose 156 (A) 70 - 99 mg/dl  POCT glycosylated hemoglobin (Hb A1C)   Collection Time: 06/15/24  2:00 PM  Result Value Ref Range   Hemoglobin A1C     HbA1c POC (<> result, manual entry)     HbA1c, POC (prediabetic range)     HbA1c, POC (controlled diabetic range) 6.9 0.0 - 7.0 %  She discontinued metformin  due to severe side effects including nausea, dizziness, and vomiting. She monitors her blood sugar every three days, with readings typically around 100 mg/dL before meals. Her last A1c was 6.9% four months ago. She has made dietary changes, reducing sugary drinks and popsicles, and thinks she has lost some weight but weight has remained stable by our scale. She finds it challenging to exercise due neuropathy symptoms in her feet.   COPD/tob: She has COPD and uses Breztri  inhaler consistently. She has a follow-up with her pulmonologist in three weeks. She has  reduced her smoking but has not quit completely, smoking up to six to eight cigarettes a day. Not drinking alcohol has helped reduce her smoking frequency.  ETOH Cirrhosis: Found to have some esophageal varices on EGD done last month.  She also had colonoscopy where several polyps were removed 6 of which were precancerous.  Due for colonoscopy again in 5 years.  She continues to take furosemide , spironolactone , and lactulose , reporting one to two bowel movements daily. She experiences occasional swelling in her feet but not in her abdomen.  She experiences neuropathy, particularly in her feet at night, and is currently on gabapentin  200 mg twice daily.  She saw the neurologist Dr. Margaret.  He gave some recommendations of medications that can be tried besides the gabapentin  including Lyrica, Elavil, Cymbalta, capsaicin  cream.  She has tried CBD cream which helps some. She has fallen a couple of times due to her feet 'just gone' when she gets up at night.  She is unsure if she has received the hepatitis A and B vaccines. She has a history of alcohol-induced cirrhosis and avoids certain medications that may affect her liver.  Depression screen was mildly positive on last visit.  However patient states that this is not a major issue for her now.    Patient Active Problem List   Diagnosis Date Noted   Esophageal varices in cirrhosis (HCC) 06/15/2024   New onset type 2 diabetes mellitus (HCC) 02/21/2024   Benign disease of right breast  11/06/2022   Aortic atherosclerosis (HCC) 04/19/2022   Vitamin D  deficiency 04/19/2022   Iron overload 04/19/2022   Cirrhosis (HCC) 04/12/2022   Malnutrition of moderate degree 04/12/2022   HTN (hypertension) 04/11/2022   HLD (hyperlipidemia) 04/11/2022   Hypoalbuminemia 04/11/2022   Alcoholic cirrhosis of liver with ascites (HCC) 04/11/2022   Bronchiectasis without acute exacerbation (HCC) 03/06/2014   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 02/19/2014    Environmental allergies 02/19/2014   Cigarette smoker 12/31/2013     Current Outpatient Medications on File Prior to Visit  Medication Sig Dispense Refill   Accu-Chek Softclix Lancets lancets Use as directed to check blood sugars once a day. 100 each 12   albuterol  (PROVENTIL ) (2.5 MG/3ML) 0.083% nebulizer solution Take 3 mLs (2.5 mg total) by nebulization every 4 (four) hours as needed. 75 mL 12   albuterol  (VENTOLIN  HFA) 108 (90 Base) MCG/ACT inhaler Inhale 2 puffs into the lungs every 6 (six) hours as needed for wheezing or shortness of breath. 54 g 1   baclofen  (LIORESAL ) 10 MG tablet Take 1 tablet (10 mg total) by mouth 2 (two) times daily. 180 each 3   Blood Glucose Monitoring Suppl (ACCU-CHEK GUIDE) w/Device KIT Use as directed to check blood sugars once a day. 1 kit 0   budeson-glycopyrrolate -formoterol  (BREZTRI  AEROSPHERE) 160-9-4.8 MCG/ACT AERO Inhale 2 puffs into the lungs in the morning and at bedtime.     budesonide -glycopyrrolate -formoterol  (BREZTRI  AEROSPHERE) 160-9-4.8 MCG/ACT AERO inhaler Inhale 2 puffs into the lungs 2 (two) times daily. 10.7 g 11   folic acid  (FOLVITE ) 1 MG tablet Take 1 tablet (1 mg total) by mouth daily. 90 tablet 1   furosemide  (LASIX ) 20 MG tablet Take 1 tablet (20 mg total) by mouth daily. 30 tablet 3   glucose blood (ACCU-CHEK GUIDE TEST) test strip Use as directed to check blood sugars once a day. 100 each 12   lactulose  (CHRONULAC ) 10 GM/15ML solution Take 15 mLs (10 g total) by mouth 2 (two) times daily. 2365 mL 1   Multiple Vitamins-Minerals (CENTRUM SILVER PO) Take 1 tablet by mouth daily.     OVER THE COUNTER MEDICATION Apply 1 application  topically 2 (two) times daily as needed (joint pain). Tiger Balm PO     spironolactone  (ALDACTONE ) 50 MG tablet Take 1 tablet (50 mg total) by mouth daily. Please keep your upcoming appointment for further refills. Thank you 30 tablet 2   thiamine  (VITAMIN B1) 100 MG tablet Take 1 tablet (100 mg total) by  mouth daily. 30 tablet 0   Trolamine Salicylate (ASPERCREME EX) Apply 1 Application topically See admin instructions. 1 application 3-4 times a day as needed for joint pain     Vitamin D , Ergocalciferol , (DRISDOL ) 1.25 MG (50000 UNIT) CAPS capsule Take 1 capsule (50,000 Units total) by mouth every 7 (seven) days. 12 capsule 5   No current facility-administered medications on file prior to visit.    Allergies  Allergen Reactions   Penicillins Hives   Metformin  And Related Other (See Comments)    Nausea, diarrhea, dizzy    Social History   Socioeconomic History   Marital status: Single    Spouse name: Not on file   Number of children: 0   Years of education: Not on file   Highest education level: Associate degree: academic program  Occupational History   Occupation: Unemployed  Tobacco Use   Smoking status: Every Day    Current packs/day: 0.00    Average packs/day: 0.5  packs/day for 30.0 years (15.0 ttl pk-yrs)    Types: Cigarettes    Start date: 12/31/1983    Last attempt to quit: 12/30/2013    Years since quitting: 10.4   Smokeless tobacco: Never  Vaping Use   Vaping status: Never Used  Substance and Sexual Activity   Alcohol use: Not Currently    Alcohol/week: 28.0 standard drinks of alcohol    Types: 28 Glasses of wine per week   Drug use: Yes    Frequency: 3.0 times per week    Types: Marijuana    Comment: 05/08/2024 THC gummies   Sexual activity: Not Currently  Other Topics Concern   Not on file  Social History Narrative   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   Now has her own Education officer, 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    Never pregnant, NO husband   Social drinkier    Quite smoking 3/15   Social Drivers of Corporate investment banker Strain: Low Risk  (06/11/2024)   Overall Financial Resource Strain (CARDIA)    Difficulty of Paying Living Expenses: Not hard at all  Food Insecurity: No Food Insecurity (06/11/2024)   Hunger Vital Sign    Worried  About Running Out of Food in the Last Year: Never true    Ran Out of Food in the Last Year: Never true  Transportation Needs: No Transportation Needs (06/11/2024)   PRAPARE - Administrator, Civil Service (Medical): No    Lack of Transportation (Non-Medical): No  Physical Activity: Inactive (06/11/2024)   Exercise Vital Sign    Days of Exercise per Week: 0 days    Minutes of Exercise per Session: Not on file  Stress: No Stress Concern Present (06/11/2024)   [employer] of Occupational Health - Occupational Stress Questionnaire    Feeling of Stress: Only a little  Social Connections: Socially Isolated (06/11/2024)   Social Connection and Isolation Panel    Frequency of Communication with Friends and Family: More than three times a week    Frequency of Social Gatherings with Friends and Family: Twice a week    Attends Religious Services: Never    Database administrator or Organizations: No    Attends Engineer, structural: Not on file    Marital Status: Divorced  Intimate Partner Violence: Not At Risk (11/14/2023)   Humiliation, Afraid, Rape, and Kick questionnaire    Fear of Current or Ex-Partner: No    Emotionally Abused: No    Physically Abused: No    Sexually Abused: No    Family History  Problem Relation Age of Onset   Breast cancer Mother    Colon cancer Mother    CAD Father    Heart disease Father    Breast cancer Sister    Emphysema Paternal Grandfather        never smoker, Theatre stage manager   Cancer Cousin        blood cancer, unknown type   Liver disease Neg Hx    Esophageal cancer Neg Hx     Past Surgical History:  Procedure Laterality Date   ABDOMINAL HYSTERECTOMY     APPENDECTOMY     BACK SURGERY      ROS: Review of Systems Negative except as stated above  PHYSICAL EXAM: BP 115/67 (BP Location: Left Arm, Patient Position: Sitting, Cuff Size: Normal)   Pulse 79   Temp 97.9 F (36.6 C) (Oral)   Ht 5' 6 (1.676 m)  Wt 166 lb (75.3 kg)    SpO2 94%   BMI 26.79 kg/m   Wt Readings from Last 3 Encounters:  06/15/24 166 lb (75.3 kg)  06/01/24 167 lb 3.2 oz (75.8 kg)  05/15/24 167 lb (75.8 kg)    Physical Exam  General appearance - alert, well appearing, older caucasian female and in no distress Mental status -patient is alert and oriented and very talkative. Neck - supple, no significant adenopathy Chest - clear to auscultation, no wheezes, rales or rhonchi, symmetric air entry Heart - normal rate, regular rhythm, normal S1, S2, no murmurs, rubs, clicks or gallops Abdomen -abdomen is nondistended.  She has superficial veins noted extended from the umbilicus Extremities -no lower extremity edema     02/13/2024    1:42 PM 11/14/2023    1:18 PM 10/14/2023    4:12 PM  Depression screen PHQ 2/9  Decreased Interest 1 3 1   Down, Depressed, Hopeless 0 2 1  PHQ - 2 Score 1 5 2   Altered sleeping 1 3 1   Tired, decreased energy 1 3 2   Change in appetite 1 3 2   Feeling bad or failure about yourself  1 0 0  Trouble concentrating 1 1 0  Moving slowly or fidgety/restless 0 0 0  Suicidal thoughts 0 0 0  PHQ-9 Score 6 15 7   Difficult doing work/chores Very difficult Somewhat difficult        Latest Ref Rng & Units 05/28/2024    3:44 PM 05/08/2024    4:31 PM 02/13/2024    2:26 PM  CMP  Glucose 70 - 99 mg/dL 78  895  898   BUN 6 - 23 mg/dL 17  31  17    Creatinine 0.40 - 1.20 mg/dL 9.09  8.51  9.04   Sodium 135 - 145 mEq/L 136  136  141   Potassium 3.5 - 5.1 mEq/L 4.1  4.2  4.5   Chloride 96 - 112 mEq/L 102  99  100   CO2 19 - 32 mEq/L 25  29  24    Calcium  8.4 - 10.5 mg/dL 9.4  9.2  9.9   Total Protein 6.0 - 8.3 g/dL 8.5  8.3    Total Bilirubin 0.2 - 1.2 mg/dL 0.5  0.5    Alkaline Phos 39 - 117 U/L 145  130    AST 0 - 37 U/L 65  73    ALT 0 - 35 U/L 63  72     Lipid Panel     Component Value Date/Time   CHOL 121 04/19/2022 1207   TRIG 62 04/19/2022 1207   HDL 36 (L) 04/19/2022 1207   CHOLHDL 3.4 04/19/2022 1207   CHOLHDL  4.5 02/19/2014 1226   VLDL NOT CALC 02/19/2014 1226   LDLCALC 72 04/19/2022 1207    CBC    Component Value Date/Time   WBC 6.9 05/08/2024 1631   RBC 4.85 05/08/2024 1631   HGB 14.2 05/08/2024 1631   HGB 15.7 06/04/2023 1411   HCT 42.5 05/08/2024 1631   HCT 45.1 06/04/2023 1411   PLT 174.0 05/08/2024 1631   PLT 208 06/04/2023 1411   MCV 87.6 05/08/2024 1631   MCV 87 06/04/2023 1411   MCH 30.3 08/25/2023 1214   MCHC 33.5 05/08/2024 1631   RDW 15.3 05/08/2024 1631   RDW 13.2 06/04/2023 1411   LYMPHSABS 1.9 05/08/2024 1631   LYMPHSABS 2.5 06/04/2023 1411   MONOABS 0.8 05/08/2024 1631   EOSABS 0.2 05/08/2024 1631  EOSABS 0.1 06/04/2023 1411   BASOSABS 0.1 05/08/2024 1631   BASOSABS 0.1 06/04/2023 1411    ASSESSMENT AND PLAN: 1. Controlled type 2 diabetes mellitus with complication, with long-term current use of insulin (HCC) (Primary) Patient did not tolerate metformin  so will have her d/c.  Her A1c is still at goal.  Discussed and encourage to continue healthy eating habits. - POCT glycosylated hemoglobin (Hb A1C) - POCT glucose (manual entry) - Microalbumin / creatinine urine ratio - Ambulatory referral to Ophthalmology  2. Peripheral polyneuropathy She will continue the gabapentin  200 mg twice a day.  We will try her with capsaicin  cream to use on the feet at bedtime.  Looks like Cymbalta is not recommended in people with cirrhosis. - gabapentin  (NEURONTIN ) 100 MG capsule; Take 2 capsules (200 mg total) by mouth 2 (two) times daily.  Dispense: 120 capsule; Refill: 3 - Capsaicin  0.035 % CREA; Apply to affected areas of feet daily at bedtime  Dispense: 100 g; Refill: 0  3. Chronic obstructive pulmonary disease, unspecified COPD type (HCC) Stable.  Strongly advised to quit smoking.  Continue Breztri  inhaler.  Keep upcoming appointment with her pulmonologist.  4. Tobacco dependence Strongly encouraged her to quit smoking.  5. Alcoholic cirrhosis of liver without ascites  (HCC) Patient to continue furosemide , spironolactone  and lactulose . She has mild elevation in liver function tests but maintains that she has not resumed consumption of alcoholic beverages.  6. Esophageal varices in cirrhosis (HCC) Monitored by gastroenterology  7. Not immune to hepatitis B virus Patient not sure whether she was immunized for hepatitis A and B.  She is agreeable for me to check titers to see if she has had the hep B.  If not based on lab results, I would recommend that she gets the Twinrix series -- Hepatitis B surface antibody,quantitative  8. Positive depression screening Positive on previous visit.  Patient states this is not a major issue for her at this time.  9. Immunization due - Flu vaccine HIGH DOSE PF(Fluzone Trivalent)   Patient was given the opportunity to ask questions.  Patient verbalized understanding of the plan and was able to repeat key elements of the plan.   This documentation was completed using Paediatric nurse.  Any transcriptional errors are unintentional.  Orders Placed This Encounter  Procedures   Flu vaccine HIGH DOSE PF(Fluzone Trivalent)   Microalbumin / creatinine urine ratio   Hepatitis B surface antibody,quantitative   Ambulatory referral to Ophthalmology   POCT glycosylated hemoglobin (Hb A1C)   POCT glucose (manual entry)     Requested Prescriptions   Signed Prescriptions Disp Refills   gabapentin  (NEURONTIN ) 100 MG capsule 120 capsule 3    Sig: Take 2 capsules (200 mg total) by mouth 2 (two) times daily.   hydrOXYzine  (ATARAX ) 25 MG tablet 30 tablet 0    Sig: Take 1 tablet (25 mg total) by mouth daily as needed for itching.   omeprazole  (PRILOSEC) 20 MG capsule 90 capsule 1    Sig: Take 1 capsule (20 mg total) by mouth daily.   Capsaicin  0.035 % CREA 100 g 0    Sig: Apply to affected areas of feet daily at bedtime    Return in about 4 months (around 10/15/2024) for Medicare Wellness Visit in 1-6   wks  with LPN.  Barnie Louder, MD, FACP

## 2024-06-15 NOTE — Patient Instructions (Signed)
 VISIT SUMMARY:  Today, we reviewed your diabetes, COPD, liver health, and other ongoing health concerns. We discussed your current medications, recent changes, and lifestyle modifications. We also planned for some follow-up tests and appointments to ensure your conditions are well-managed.  YOUR PLAN:  -TYPE 2 DIABETES MELLITUS: Type 2 diabetes is a condition where your body does not use insulin properly, leading to high blood sugar levels. Your blood sugar levels are generally well-controlled, and you have made positive dietary changes. Please check your blood sugars daily, alternating between pre-breakfast and pre-dinner readings. Continue with your dietary modifications to reduce sugar intake. We will order a urine microalbumin test annually and submit a referral for your annual eye exam.  -CHRONIC OBSTRUCTIVE PULMONARY DISEASE (COPD): COPD is a chronic lung disease that makes it hard to breathe. You are managing it with the Breztri  inhaler. Please continue using your inhaler as prescribed and try to quit smoking. You have a follow-up with your pulmonologist in three weeks.  -ALCOHOLIC CIRRHOSIS OF LIVER WITH GRADE 1 ESOPHAGEAL VARICES: Alcoholic cirrhosis is severe liver damage caused by long-term alcohol use, and esophageal varices are swollen veins in the esophagus. You are taking furosemide , spironolactone , and lactulose  to manage your symptoms. Please monitor for swelling in your feet and abdomen. We will consider starting the hepatitis A and B vaccination series and order a blood test to check your immunity status for these infections.  -PERIPHERAL NEUROPATHY: Peripheral neuropathy is nerve damage that causes pain and numbness, often in the feet and hands. You are taking gabapentin  and using CBD cream for relief. We will prescribe capsaicin  cream for you to use at bedtime. If your insurance does not cover it, consider over-the-counter options.  -HISTORY OF COLONIC POLYPS, REMOVED: Colonic  polyps are growths in the colon that can become cancerous. Your recent colonoscopy removed ten polyps, six of which were precancerous. We will schedule a repeat colonoscopy in five years.  -TOBACCO USE: Smoking can worsen many health conditions, including COPD and liver disease. Please continue your efforts to quit smoking, and we will monitor your smoking habits.  -GENERAL HEALTH MAINTENANCE: For your overall health, we recommend getting a flu shot and considering the hepatitis A and B vaccination series due to your liver condition. We will order a blood test to check your immunity status for hepatitis A and B.  INSTRUCTIONS:  Please follow up with your pulmonologist in three weeks. We will submit a referral for your annual eye exam and order a urine microalbumin test annually.

## 2024-06-16 LAB — MICROALBUMIN / CREATININE URINE RATIO
Creatinine, Urine: 147 mg/dL
Microalb/Creat Ratio: 8 mg/g{creat} (ref 0–29)
Microalbumin, Urine: 12 ug/mL

## 2024-06-16 LAB — HEPATITIS B SURFACE ANTIBODY, QUANTITATIVE: Hepatitis B Surf Ab Quant: <3.5 m[IU]/mL — ABNORMAL LOW

## 2024-06-17 ENCOUNTER — Other Ambulatory Visit: Payer: Self-pay

## 2024-06-17 ENCOUNTER — Ambulatory Visit: Payer: Self-pay | Admitting: Internal Medicine

## 2024-06-17 ENCOUNTER — Other Ambulatory Visit: Payer: Self-pay | Admitting: Internal Medicine

## 2024-06-17 MED ORDER — HEPATITIS A-HEP B RECOMB VAC 720-20 ELU-MCG/ML IM SUSY
1.0000 mL | PREFILLED_SYRINGE | Freq: Once | INTRAMUSCULAR | 0 refills | Status: DC
  Filled 2024-06-17 – 2024-07-23 (×3): qty 1, 1d supply, fill #0

## 2024-07-07 ENCOUNTER — Other Ambulatory Visit: Payer: Self-pay

## 2024-07-07 ENCOUNTER — Telehealth: Payer: Self-pay

## 2024-07-07 ENCOUNTER — Other Ambulatory Visit: Payer: Self-pay | Admitting: Internal Medicine

## 2024-07-07 DIAGNOSIS — K754 Autoimmune hepatitis: Secondary | ICD-10-CM

## 2024-07-07 DIAGNOSIS — K7030 Alcoholic cirrhosis of liver without ascites: Secondary | ICD-10-CM

## 2024-07-07 NOTE — Telephone Encounter (Signed)
-----   Message from Glendia FORBES Holt sent at 07/07/2024 12:23 PM EDT ----- Regarding: Repeat labs Alan,  Please contact Ms. Berland and let her know that I would like to repeat her liver labs again.  The numbers were going up last  month and I want to see if they are improving or getting worse.  If still elevated, I would recommend referral to a hepatologist to help determine whether therapy for autoimmune hepatitis is warranted given the uncertainty of the diagnosis and underlying cirrhosis.  Please order: CMP, total IgG level, anti-smooth muscle antibody  Thanks

## 2024-07-07 NOTE — Telephone Encounter (Signed)
 Informed patient that Dr. Stacia would like her to have repeat liver labs to see if her numbers are still elevated. Also, informed patient that depending on the lab results, Dr. Stacia may refer her to a hepatologist to help determine whether therapy for autoimmune hepatitis is warranted given the uncertainty of the diagnosis and underlying cirrhosis. Patient verbalized understanding and states she will come to our lab.

## 2024-07-08 ENCOUNTER — Other Ambulatory Visit: Payer: Self-pay

## 2024-07-09 ENCOUNTER — Other Ambulatory Visit: Payer: Self-pay

## 2024-07-10 NOTE — Progress Notes (Signed)
 Angel [name]                                          MRN: [id number]   07/10/2024   The [facility] Quality Team Specialist reviewed this patient medical record for the purposes of chart review for care gap closure. The following were reviewed: chart review for care gap closure-diabetic eye exam.    [facility] Quality Team

## 2024-07-16 ENCOUNTER — Other Ambulatory Visit: Payer: Self-pay

## 2024-07-16 ENCOUNTER — Encounter: Payer: Self-pay | Admitting: Gastroenterology

## 2024-07-16 ENCOUNTER — Other Ambulatory Visit: Payer: Self-pay | Admitting: Internal Medicine

## 2024-07-16 MED ORDER — THIAMINE HCL 100 MG PO TABS
100.0000 mg | ORAL_TABLET | Freq: Every day | ORAL | 0 refills | Status: DC
  Filled 2024-07-16: qty 90, 90d supply, fill #0

## 2024-07-17 ENCOUNTER — Other Ambulatory Visit: Payer: Self-pay

## 2024-07-17 ENCOUNTER — Other Ambulatory Visit (INDEPENDENT_AMBULATORY_CARE_PROVIDER_SITE_OTHER)

## 2024-07-17 DIAGNOSIS — K7030 Alcoholic cirrhosis of liver without ascites: Secondary | ICD-10-CM

## 2024-07-17 DIAGNOSIS — K754 Autoimmune hepatitis: Secondary | ICD-10-CM

## 2024-07-17 LAB — COMPREHENSIVE METABOLIC PANEL WITH GFR
ALT: 147 U/L — ABNORMAL HIGH (ref 0–35)
AST: 147 U/L — ABNORMAL HIGH (ref 0–37)
Albumin: 3.7 g/dL (ref 3.5–5.2)
Alkaline Phosphatase: 149 U/L — ABNORMAL HIGH (ref 39–117)
BUN: 13 mg/dL (ref 6–23)
CO2: 29 meq/L (ref 19–32)
Calcium: 9.2 mg/dL (ref 8.4–10.5)
Chloride: 103 meq/L (ref 96–112)
Creatinine, Ser: 0.86 mg/dL (ref 0.40–1.20)
GFR: 70.65 mL/min (ref >60.00)
Glucose, Bld: 60 mg/dL — ABNORMAL LOW (ref 70–99)
Potassium: 4.2 meq/L (ref 3.5–5.1)
Sodium: 138 meq/L (ref 135–145)
Total Bilirubin: 0.5 mg/dL (ref 0.2–1.2)
Total Protein: 8.3 g/dL (ref 6.0–8.3)

## 2024-07-20 ENCOUNTER — Other Ambulatory Visit: Payer: Self-pay

## 2024-07-21 ENCOUNTER — Other Ambulatory Visit: Payer: Self-pay

## 2024-07-22 ENCOUNTER — Other Ambulatory Visit: Payer: Self-pay

## 2024-07-23 ENCOUNTER — Other Ambulatory Visit: Payer: Self-pay

## 2024-07-23 LAB — ANTI-SMOOTH MUSCLE ANTIBODY, IGG: Actin (Smooth Muscle) Antibody (IGG): 73 U — ABNORMAL HIGH (ref <20)

## 2024-07-23 LAB — IGG: IgG (Immunoglobin G), Serum: 2523 mg/dL — ABNORMAL HIGH (ref 600–1540)

## 2024-07-30 ENCOUNTER — Other Ambulatory Visit: Payer: Self-pay

## 2024-07-30 ENCOUNTER — Ambulatory Visit: Payer: Self-pay | Admitting: Gastroenterology

## 2024-07-30 DIAGNOSIS — K7030 Alcoholic cirrhosis of liver without ascites: Secondary | ICD-10-CM

## 2024-07-30 DIAGNOSIS — K754 Autoimmune hepatitis: Secondary | ICD-10-CM

## 2024-07-30 MED ORDER — PREDNISONE 10 MG PO TABS
10.0000 mg | ORAL_TABLET | Freq: Every day | ORAL | 1 refills | Status: DC
  Filled 2024-07-30: qty 30, 30d supply, fill #0
  Filled 2024-08-28: qty 30, 30d supply, fill #1

## 2024-07-30 NOTE — Progress Notes (Signed)
 Ms. Mutch,  Your liver enzymes and antibody levels have continued to rise.  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 I would like to refer you to a liver specialist for assistance in managing your disease. I think we should probably restart you on your prednisone , and continue to watch your liver enzymes closely. I would like to start you out on a low-dose and see if there is some improvement, to avoid unnecessary higher doses.  Please start taking prednisone  10 mg daily, and plan to repeat liver enzymes in 1 month.  Alan, Please place a prescription for prednisone  10 mg p.o. daily #30 RF 1 Please place orders for PT hepatic panel in 1 month Please place referral to [facility]/Dawn Drasek for further management of autoimmune hepatitis/cirrhosis

## 2024-07-31 ENCOUNTER — Other Ambulatory Visit: Payer: Self-pay

## 2024-08-03 ENCOUNTER — Other Ambulatory Visit: Payer: Self-pay

## 2024-08-04 ENCOUNTER — Encounter: Payer: Self-pay | Admitting: Internal Medicine

## 2024-08-04 ENCOUNTER — Other Ambulatory Visit: Payer: Self-pay

## 2024-08-04 ENCOUNTER — Ambulatory Visit: Admitting: Internal Medicine

## 2024-08-04 VITALS — BP 117/74 | HR 81 | Temp 98.1°F | Ht 66.0 in | Wt 165.0 lb

## 2024-08-04 DIAGNOSIS — J479 Bronchiectasis, uncomplicated: Secondary | ICD-10-CM

## 2024-08-04 DIAGNOSIS — B370 Candidal stomatitis: Secondary | ICD-10-CM | POA: Diagnosis not present

## 2024-08-04 DIAGNOSIS — J449 Chronic obstructive pulmonary disease, unspecified: Secondary | ICD-10-CM | POA: Diagnosis not present

## 2024-08-04 DIAGNOSIS — F17210 Nicotine dependence, cigarettes, uncomplicated: Secondary | ICD-10-CM | POA: Diagnosis not present

## 2024-08-04 MED ORDER — CLOTRIMAZOLE 10 MG MT TROC
OROMUCOSAL | 2 refills | Status: AC
  Filled 2024-08-04: qty 70, 14d supply, fill #0
  Filled 2024-08-21: qty 70, 14d supply, fill #1
  Filled 2024-10-15: qty 70, 14d supply, fill #2

## 2024-08-04 MED ORDER — FAMOTIDINE 20 MG PO TABS
20.0000 mg | ORAL_TABLET | Freq: Every day | ORAL | 11 refills | Status: AC
  Filled 2024-08-04: qty 30, 30d supply, fill #0
  Filled 2024-08-30: qty 30, 30d supply, fill #1
  Filled 2024-10-03: qty 30, 30d supply, fill #2
  Filled 2024-11-09: qty 30, 30d supply, fill #3

## 2024-08-04 NOTE — Patient Instructions (Addendum)
 Pepcid 20 mg  one after supper   Keep head up 30 degrees if possible   For cough/ congestion > mucinex  or mucinex  dm  up to maximum of  1200 mg every 12 hours and use the flutter valve as much as you can   GERD (REFLUX)  is an extremely common cause of respiratory symptoms just like yours , many times with no obvious heartburn at all.    It can be treated with medication, but also with lifestyle changes including elevation of the head of your bed (ideally with 6 -8inch blocks under the headboard of your bed - or sleep in recliner),  Smoking cessation, avoidance of late meals, excessive alcohol, and avoid fatty foods, chocolate, peppermint, colas, red wine, and acidic juices such as orange juice.  NO MINT OR MENTHOL PRODUCTS SO NO COUGH DROPS  USE SUGARLESS CANDY INSTEAD (Jolley ranchers or Stover's or Life Savers) or even ice chips will also do - the key is to swallow to prevent all throat clearing. NO OIL BASED VITAMINS - use powdered substitutes.  Avoid fish oil when coughing.   The key is to stop smoking completely before smoking completely stops you!   Please schedule a follow up visit in 12  months but call sooner if needed

## 2024-08-04 NOTE — Assessment & Plan Note (Addendum)
 Clotromtrizole troche 10 mg up to 5 x daily prn 08/04/2024 >>>    Each maintenance medication was reviewed in detail including emphasizing most importantly the difference between maintenance and prns and under what circumstances the prns are to be triggered using an action plan format where appropriate.  Total time for H and P, chart review, counseling, reviewing hfa/ flutter device(s) and generating customized AVS unique to this office visit / same day charting = 32 min

## 2024-08-04 NOTE — Assessment & Plan Note (Addendum)
 Active smoker / already on mucinex  but not in max doses  > >> max mucinex  plus flutter valve/ training  08/04/2024

## 2024-08-04 NOTE — Assessment & Plan Note (Addendum)
 Active smoker /MM - advair 250 one bid started 03/06/2014  - 12/24/2023    trial of breztri    PFT's  03/20/2024   FEV1 1.37 (52 % ) ratio 0.60  p 0 % improvement from saba p 0 prior to study with DLCO  15.35 (72%)   and FV curve mildly concave   - 03/19/2024   Alpha One AT phenotype>>> MM  level 209  - 08/04/2024  After extensive coaching inhaler device,  effectiveness =    90% hfa > continue breztri     Group E in terms of symptoms/risk so  laba/lama/ICS  therefore appropriate rx at this point  >>>  breztri    and now on approp SABA prn.  >>> add pepcid 20 mg p supper for noct cough /sob when flat    Please schedule a follow up visit in 12  months but call sooner if needed

## 2024-08-04 NOTE — Assessment & Plan Note (Addendum)
 Referred to  [facility] program   Counseled re importance of smoking cessation but did not meet time criteria for separate billing

## 2024-08-04 NOTE — Progress Notes (Signed)
 Subjective:    Patient ID: Angel [name], female    DOB: [birth year]   MRN: [id number]  HPI  57 yowf Active smoker/MM with hx asthma/pna as child never really 100% but no need maintenance rx or speciliast eval then started smoking and did fine until 40's with sob/ intermttent cough started using inhalers referred 03/05/2014 to pulmonary clinic by Dr Colton   with 2nd admit to [facility] and abd ct Chest 02/24/14   Admit date: 01/21/2014  Discharge date: 01/25/2014  Recommendations for Outpatient Follow-up:  Recommend CXR to denotes clearing PNa and ensure no lung CA as smoker Continue Delsym  for cough Given burst of prednisone  + to complete this and levaquin  to complete 4/25 Will need PCP follow-up as scheduled Limited Rx vicodin given for Chest pain fro cough/pluerisy Discharge Diagnoses:   COPD exacerbation  HCAP (healthcare-associated pneumonia)  Discharge Condition: good  Diet recommendation: reg  Filed Weights    01/21/14 1306   Weight:  69.6 kg (153 lb 7 oz)   History of present illness:  68 y.o. female came to [facility] ed 01/21/2014 with cough. Recent hospitlized 3/25-3/28 wioth COPD exacerbation, compliant with therapy, took azithro/prednisone -readmitted with 3-4 day h/o increasing cough, sputum, fever and mianly SOB  Found on CXR to have HCAP R side in a setting of Prior Strep Pneumonia on Sputum culture from last hospital stay  Hospital Course:  HCAP-continue empiric coverage-negative strep pneumo/legionella/sputum cult is still pending however-narrowed to PO levaquin  for 7 more days on 4/19 as Strep PNA from last visit was sensitive to this. No WBC no fever or chills COPD exacerbation-continue Steroids Prednisone  60 mg daily for a 5-7 day burst. continue albuterol  nebs q 4 prn-given increasing cough and pain added delsym  30 mg bid. Continue atrovent  0.5 q4 prn. NO O2 req now. OOB to chair and ambulate/shower prn Chest pain probably musculoskeletal. Is already on tramadol  for this. Added  Vicodin and encouraged to take NSAIDs as OP in between. Will need CXR in 1 month Consultants:  none Procedures:  none Antibiotics:  Aztreonam  4/16-4/18  Vancomycin  4/16-4/18  Levaquin  4/19-4/23      03/05/2014 1st [facility] office visit/ Tuan Tippin/ advair 100 one  Bid  Chief Complaint  Patient presents with   Pulmonary Consult    Referred per Dr. Deepak Avandi for eval of abnormal ct chest. Pt c/o SOB and cough since Feb 2015. She is SOB climbing stairs. Cough is minimal.   still smoking but cutting down to one a day - for the last sev years wakes up avg 3 days a week wakes up with  Prod of clear tsp thin mucoid sputm and never bloody last time purulent x  6-8 m prior to OV   Doe x parking lot but do a superstore ok once level/slow/a/c Using saba neb twice daily on avg  Rec Bronchiectasis =   you have scarring of your bronchial tubes  Whenever you develop cough congestion take mucinex  or mucinex  dm   Increase advair to 250 twice daily  Only use your albuterol  (ventolin ) as a rescue medication  The key is to stop smoking completely before smoking completely stops you!      PFT's  03/20/2024   FEV1 1.37 (52 % ) ratio 0.60  p 0 % improvement from saba p 0 prior to study with DLCO  15.35 (72%)   and FV curve mildly concave      03/19/2024  f/u ov/Magdelene Ruark re: copd 2/bronchiectasis still smoking   maint  on breztri     Chief Complaint  Patient presents with   Follow-up    PFT f/u    Dyspnea:  easier walking groceries to house and grocery shop  Cough: better esp iam component/ mucoid sputum Sleeping:almost flat recliner  SABA use: once a day  02: none Lung cancer screening :  referring  Rec Plan A = Automatic = Always=    Breztri    Work on inhaler technique:   Plan B = Backup (to supplement plan A, not to replace it) Only use your albuterol  inhaler as a rescue medication  Plan C = Crisis (instead of Plan B but only if Plan B stops working) My office will be contacting you by  phone for referral to lung cancer screening   ([phone])> not done as of 08/04/2024       08/04/2024  f/u ov/Laquan Beier re:   COPD 2 / bronchiectasis   maint on breztri  /  still smoking  Chief Complaint  Patient presents with   COPD    Patient's breathing is good.   Dyspnea:  limited more by feet than breathing  Cough: Mucoid/ worse in AMs just takes a couple good coughs to clear  Sleeping: recliner s  resp cc but can't lie falt due to cough  SABA use: rarely hfa / neb maybe once a week  02: none  Lung cancer screening :  referred again    No obvious day to day or daytime variability or assoc excess/ purulent sputum or mucus plugs or hemoptysis or cp or chest tightness, subjective wheeze or overt sinus or hb symptoms.    Also denies any obvious fluctuation of symptoms with weather or environmental changes or other aggravating or alleviating factors except as outlined above   No unusual exposure hx or h/o childhood  asthma or knowledge of premature birth.  Current Allergies, Complete Past Medical History, Past Surgical History, Family History, and Social History were reviewed in [employer] record.  ROS  The following are not active complaints unless bolded Hoarseness, sore throat, dysphagia, dental problems, itching, sneezing,  nasal congestion or discharge of excess mucus or purulent secretions, ear ache,   fever, chills, sweats, unintended wt loss or wt gain, classically pleuritic or exertional cp,  orthopnea pnd or arm/hand swelling  or leg swelling, presyncope, palpitations, abdominal pain, anorexia, nausea, vomiting, diarrhea  or change in bowel habits or change in bladder habits, change in stools or change in urine, dysuria, hematuria,  rash, arthralgias, visual complaints, headache, numbness, weakness or ataxia or problems with walking or coordination,  change in mood or  memory.        Current Meds  Medication Sig   albuterol  (PROVENTIL ) (2.5 MG/3ML)  0.083% nebulizer solution Take 3 mLs (2.5 mg total) by nebulization every 4 (four) hours as needed.   albuterol  (VENTOLIN  HFA) 108 (90 Base) MCG/ACT inhaler Inhale 2 puffs into the lungs every 6 (six) hours as needed for wheezing or shortness of breath.   baclofen  (LIORESAL ) 10 MG tablet Take 1 tablet (10 mg total) by mouth 2 (two) times daily.   budeson-glycopyrrolate -formoterol  (BREZTRI  AEROSPHERE) 160-9-4.8 MCG/ACT AERO Inhale 2 puffs into the lungs in the morning and at bedtime.   clotrimazole  (MYCELEX ) 10 MG troche Use 1 troche up to 5 times daily as needed   famotidine (PEPCID) 20 MG tablet Take 1 tablet (20 mg total) by mouth daily after supper.   folic acid  (FOLVITE ) 1 MG tablet Take 1 tablet (1 mg  total) by mouth daily.   furosemide  (LASIX ) 20 MG tablet Take 1 tablet (20 mg total) by mouth daily.   gabapentin  (NEURONTIN ) 100 MG capsule Take 2 capsules (200 mg total) by mouth 2 (two) times daily.   hydrOXYzine  (ATARAX ) 25 MG tablet Take 1 tablet (25 mg total) by mouth daily as needed for itching.   lactulose  (CHRONULAC ) 10 GM/15ML solution Take 15 mLs (10 g total) by mouth 2 (two) times daily.   Multiple Vitamins-Minerals (CENTRUM SILVER PO) Take 1 tablet by mouth daily.   omeprazole  (PRILOSEC) 20 MG capsule Take 1 capsule (20 mg total) by mouth daily.   OVER THE COUNTER MEDICATION Apply 1 application  topically 2 (two) times daily as needed (joint pain). Tiger Balm PO   predniSONE  (DELTASONE ) 10 MG tablet Take 1 tablet (10 mg total) by mouth daily with breakfast.   spironolactone  (ALDACTONE ) 50 MG tablet Take 1 tablet (50 mg total) by mouth daily. Please keep your upcoming appointment for further refills. Thank you   thiamine  (VITAMIN B1) 100 MG tablet Take 1 tablet (100 mg total) by mouth daily.   Trolamine Salicylate (ASPERCREME EX) Apply 1 Application topically See admin instructions. 1 application 3-4 times a day as needed for joint pain   Vitamin D , Ergocalciferol , (DRISDOL ) 1.25 MG  (50000 UNIT) CAPS capsule Take 1 capsule (50,000 Units total) by mouth every 7 (seven) days.                    Objective:   Physical Exam  Wts  08/04/2024       96  03/19/2024        171 12/24/2023        168   03/05/14 158 lb (71.668 kg)  02/19/14 161 lb 9.6 oz (73.301 kg)  01/21/14 153 lb 7 oz (69.6 kg)      Vital signs reviewed  08/04/2024  - Note at rest 02 sats  96% on RA    General appearance:    amb wf / very harsh cough/minimally congested sounding      HEENT : Oropharynx  mild thrush    NECK :  without  apparent JVD/ palpable Nodes/TM    LUNGS: no acc muscle use,  Min barrel  contour chest wall with bilateral insp /exp rhonchi  and  without cough on insp or exp maneuvers and min  Hyperresonant  to  percussion bilaterally    CV:  RRR  no s3 or murmur or increase in P2, and no edema   ABD:  soft and nontender    MS:  Nl gait/ ext warm without deformities Or obvious joint restrictions  calf tenderness, cyanosis or clubbing     SKIN: warm and dry without lesions    NEURO:  alert, approp, nl sensorium with  no motor or cerebellar deficits apparent.           Assessment & Plan:   Assessment & Plan Cigarette smoker Referred to  [facility] program   Counseled re importance of smoking cessation but did not meet time criteria for separate billing     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 Active smoker /MM - advair 250 one bid started 03/06/2014  - 12/24/2023    trial of breztri    PFT's  03/20/2024   FEV1 1.37 (52 % ) ratio 0.60  p 0 % improvement from saba p 0 prior to study with DLCO  15.35 (72%)   and FV curve mildly concave   -  03/19/2024   Alpha One AT phenotype>>> MM  level 209  - 08/04/2024  After extensive coaching inhaler device,  effectiveness =    90% hfa > continue breztri     Group E in terms of symptoms/risk so  laba/lama/ICS  therefore appropriate rx at this point  >>>  breztri    and now on approp SABA prn.  >>> add pepcid 20 mg p supper  for noct cough /sob when flat    Please schedule a follow up visit in 12  months but call sooner if needed    Bronchiectasis without acute exacerbation (HCC) Active smoker / already on mucinex  but not in max doses  > >> max mucinex  plus flutter valve/ training  08/04/2024   Oral candidiasis Clotromtrizole troche 10 mg up to 5 x daily prn 08/04/2024 >>>    Each maintenance medication was reviewed in detail including emphasizing most importantly the difference between maintenance and prns and under what circumstances the prns are to be triggered using an action plan format where appropriate.  Total time for H and P, chart review, counseling, reviewing hfa/ flutter device(s) and generating customized AVS unique to this office visit / same day charting = 32 min     AVS  Patient Instructions  Pepcid 20 mg  one after supper   Keep head up 30 degrees if possible   For cough/ congestion > mucinex  or mucinex  dm  up to maximum of  1200 mg every 12 hours and use the flutter valve as much as you can   GERD (REFLUX)  is an extremely common cause of respiratory symptoms just like yours , many times with no obvious heartburn at all.    It can be treated with medication, but also with lifestyle changes including elevation of the head of your bed (ideally with 6 -8inch blocks under the headboard of your bed - or sleep in recliner),  Smoking cessation, avoidance of late meals, excessive alcohol, and avoid fatty foods, chocolate, peppermint, colas, red wine, and acidic juices such as orange juice.  NO MINT OR MENTHOL PRODUCTS SO NO COUGH DROPS  USE SUGARLESS CANDY INSTEAD (Jolley ranchers or Stover's or Life Savers) or even ice chips will also do - the key is to swallow to prevent all throat clearing. NO OIL BASED VITAMINS - use powdered substitutes.  Avoid fish oil when coughing.   The key is to stop smoking completely before smoking completely stops you!   Please schedule a follow up visit in 12   months but call sooner if needed        Ozell America, MD 08/04/2024

## 2024-08-05 ENCOUNTER — Other Ambulatory Visit: Payer: Self-pay

## 2024-08-06 ENCOUNTER — Ambulatory Visit

## 2024-08-06 ENCOUNTER — Ambulatory Visit: Attending: Internal Medicine | Admitting: Internal Medicine

## 2024-08-06 ENCOUNTER — Other Ambulatory Visit: Payer: Self-pay

## 2024-08-06 ENCOUNTER — Encounter: Payer: Self-pay | Admitting: Internal Medicine

## 2024-08-06 ENCOUNTER — Other Ambulatory Visit: Payer: Self-pay | Admitting: Internal Medicine

## 2024-08-06 ENCOUNTER — Telehealth: Payer: Self-pay | Admitting: *Deleted

## 2024-08-06 VITALS — BP 107/61 | HR 79 | Temp 97.5°F | Resp 17 | Ht 64.5 in | Wt 165.6 lb

## 2024-08-06 DIAGNOSIS — R7689 Other specified abnormal immunological findings in serum: Secondary | ICD-10-CM | POA: Diagnosis not present

## 2024-08-06 DIAGNOSIS — E790 Hyperuricemia without signs of inflammatory arthritis and tophaceous disease: Secondary | ICD-10-CM | POA: Diagnosis not present

## 2024-08-06 DIAGNOSIS — R252 Cramp and spasm: Secondary | ICD-10-CM

## 2024-08-06 DIAGNOSIS — R29898 Other symptoms and signs involving the musculoskeletal system: Secondary | ICD-10-CM | POA: Diagnosis not present

## 2024-08-06 DIAGNOSIS — M19042 Primary osteoarthritis, left hand: Secondary | ICD-10-CM

## 2024-08-06 DIAGNOSIS — M19041 Primary osteoarthritis, right hand: Secondary | ICD-10-CM

## 2024-08-06 MED ORDER — HYDROXYZINE HCL 25 MG PO TABS
25.0000 mg | ORAL_TABLET | Freq: Every day | ORAL | 0 refills | Status: DC | PRN
  Filled 2024-08-06: qty 90, 90d supply, fill #0

## 2024-08-06 MED ORDER — MAGNESIUM GLYCINATE 100 MG PO CAPS
1.0000 | ORAL_CAPSULE | Freq: Two times a day (BID) | ORAL | 2 refills | Status: DC | PRN
  Filled 2024-08-06: qty 60, 30d supply, fill #0
  Filled 2024-09-13: qty 60, 30d supply, fill #1
  Filled 2024-10-13: qty 60, 30d supply, fill #2

## 2024-08-06 NOTE — Telephone Encounter (Signed)
 Left VM for pt to call regarding lung screening referral.

## 2024-08-06 NOTE — Progress Notes (Signed)
 Office Visit Note  Patient: Angel [name]             Date of Birth: 1956-03-07           MRN: [id number]             PCP: Vicci Barnie NOVAK, MD Referring: Jule Ronal LITTIE DEVONNA Visit Date: 08/06/2024 Occupation: Data Unavailable  Subjective:  New Patient (Initial Visit), Pain (Legs, feet, hands ), and Abnormal Lab   Discussed the use of AI scribe software for clinical note transcription with the patient, who gave verbal consent to proceed.  History of Present Illness   Aunna Snooks is a 68 year old female with autoimmune hepatitis who presents with severe muscle cramping and joint swelling for evaluation with positive ANA, RF, and hyperurecemia.  She experiences severe muscle cramping, primarily at night, affecting her feet, the back of her legs, and her groin muscle. The cramping is debilitating, causing instability and a fear of falling. It is unpredictable, often occurring when she is lying down, and is not linked to any specific food or activity. She uses THC and CBD ointments for relief and takes gabapentin  and baclofen  regularly, but still experiences cramping.  She has a history of autoimmune hepatitis and has been on prednisone , which she finds helpful. Recently, she was put back on prednisone  due to a flare-up. She was on low dose prednisone  for a long time previously with some associated chronic bruising, diabetes, candidiasis possible related.  She has elevated uric acid levels, with a recent measurement of 12.1, which is significantly above normal. She avoids alcohol and shellfish, which are known to exacerbate gout, and primarily consumes chicken and white fish. She does not report episodes of definite joint swelling except occasionally in her hands and wrists and knees, but often without much associated pain. Her family history includes rheumatoid arthritis. Her fingers occasionally lock up, and she has noticed swelling in her elbows.  She reports chronic pain in  her feet, which has been worsening over the past six months. The pain is constant, even when not cramping, and she describes it as 'hurting all the time.' No numbness, but the pain affects her stability and mobility.  She has a history of back surgery and reports the development of lipomas in the area since the surgery.  She has not undergone physical therapy for her symptoms but received a knee injection from an orthopedist about six to eight months ago, which provided significant relief. Xrays had indicated osteoarthritis as well as joint effusion and chondrocalcinosis.     Labs reviewed 07/2024 ASMA 73 IgG 2,523 AST 147 ALT 147  02/2024 ANA 1:320 homogenous Uric acid 12.1 RF 14 ESR 14  Activities of Daily Living:  Patient reports morning stiffness for 2 hours.   Patient Reports nocturnal pain.  Difficulty dressing/grooming: Denies Difficulty climbing stairs: Reports Difficulty getting out of chair: Reports Difficulty using hands for taps, buttons, cutlery, and/or writing: Reports  Review of Systems  Constitutional:  Positive for fatigue.  HENT:  Negative for mouth sores and mouth dryness.   Eyes:  Negative for dryness.  Respiratory:  Positive for shortness of breath.   Cardiovascular:  Negative for chest pain and palpitations.  Gastrointestinal:  Negative for blood in stool, constipation and diarrhea.  Endocrine: Negative for increased urination.  Genitourinary:  Negative for involuntary urination.  Musculoskeletal:  Positive for joint pain, gait problem, joint pain, joint swelling, myalgias, muscle weakness, morning stiffness, muscle tenderness and  myalgias.  Skin:  Positive for sensitivity to sunlight. Negative for color change, rash and hair loss.  Allergic/Immunologic: Negative for susceptible to infections.  Neurological:  Negative for dizziness and headaches.  Hematological:  Positive for swollen glands.  Psychiatric/Behavioral:  Positive for depressed mood and sleep  disturbance. The patient is not nervous/anxious.     PMFS History:  Patient Active Problem List   Diagnosis Date Noted   Hyperuricemia 08/06/2024   Rheumatoid factor positive 08/06/2024   Osteoarthritis of hands, bilateral 08/06/2024   Leg cramping 08/06/2024   Leg weakness, bilateral 08/06/2024   Positive ANA (antinuclear antibody) 08/06/2024   Oral candidiasis 08/04/2024   Esophageal varices in cirrhosis (HCC) 06/15/2024   New onset type 2 diabetes mellitus (HCC) 02/21/2024   Benign disease of right breast 11/06/2022   Aortic atherosclerosis 04/19/2022   Vitamin D  deficiency 04/19/2022   Iron overload 04/19/2022   Cirrhosis (HCC) 04/12/2022   Malnutrition of moderate degree 04/12/2022   HTN (hypertension) 04/11/2022   HLD (hyperlipidemia) 04/11/2022   Hypoalbuminemia 04/11/2022   Alcoholic cirrhosis of liver with ascites (HCC) 04/11/2022   Bronchiectasis without acute exacerbation (HCC) 03/06/2014   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 02/19/2014   Environmental allergies 02/19/2014   Cigarette smoker 12/31/2013    Past Medical History:  Diagnosis Date   Alcohol use 04/19/2022   Alcoholic cirrhosis (HCC)    Asthma    Bronchiolitis    Cirrhosis (HCC)    COPD (chronic obstructive pulmonary disease) (HCC)    Hypertension    Smoker 12/31/2013    Family History  Problem Relation Age of Onset   Breast cancer Mother    Colon cancer Mother    CAD Father    Heart disease Father    Breast cancer Sister    Healthy Sister    Emphysema Paternal Grandfather        never smoker, theatre stage manager   Cancer Cousin        blood cancer, unknown type   Liver disease Neg Hx    Esophageal cancer Neg Hx    Past Surgical History:  Procedure Laterality Date   ABDOMINAL HYSTERECTOMY     APPENDECTOMY     BACK SURGERY     Social History   Tobacco Use   Smoking status: Every Day    Current packs/day: 0.00    Average packs/day: 0.5 packs/day for 30.0 years (15.0 ttl  pk-yrs)    Types: Cigarettes    Start date: 12/31/1983    Last attempt to quit: 12/30/2013    Years since quitting: 10.6    Passive exposure: Never   Smokeless tobacco: Never   Tobacco comments:    Smokes 5 cigarettes a day. 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    Never pregnant, NO husband   Social drinkier    Quite smoking 3/15     Immunization History  Administered Date(s) Administered   Hep A / Hep B 07/23/2024  INFLUENZA, HIGH DOSE SEASONAL PF 06/15/2024   Influenza, Seasonal, Injecte, Preservative Fre 06/04/2023   Influenza,inj,Quad PF,6+ Mos 06/18/2022   Moderna Covid-19 Vaccine Bivalent Booster 63yrs & up 06/25/2023   PNEUMOCOCCAL CONJUGATE-20 06/18/2022   Pfizer(Comirnaty )Fall Seasonal Vaccine 12 years and older 09/26/2022   Pneumococcal Polysaccharide-23 12/31/2013   Tdap 08/16/2022   Zoster Recombinant(Shingrix ) 06/14/2023, 08/23/2023     Objective: Vital Signs: BP 107/61 (BP Location: Left Arm, Patient Position: Sitting, Cuff Size: Normal)   Pulse 79   Temp (!) 97.5 F (36.4 C)   Resp 17   Ht 5' 4.5 (1.638 m)   Wt 165 lb 9.6 oz (75.1 kg)   BMI 27.99 kg/m    Physical Exam Eyes:     Conjunctiva/sclera: Conjunctivae normal.  Cardiovascular:     Rate and Rhythm: Normal rate and regular rhythm.  Pulmonary:     Effort: Pulmonary effort is normal.     Breath sounds: Normal breath sounds.  Musculoskeletal:     Right lower leg: No edema.     Left lower leg: No edema.  Lymphadenopathy:     Cervical: No cervical adenopathy.  Skin:     General: Skin is warm and dry.     Comments: Bruising on extensor surfaces Tortuous superficial veins in ankles  Neurological:     Mental Status: She is alert.  Psychiatric:        Mood and Affect: Mood normal.      Musculoskeletal Exam:  Shoulders full ROM no tenderness or swelling Elbows full ROM no tenderness or swelling Wrists full ROM no tenderness or swelling DIP heberdon's nodes b/l hands worst 1st-3rd digits, no palpable synovitis no tenderness to pressure Some muscle loss or atrophy on right thigh compared to left, general soreness to pressure anterior and over IT band Knees full ROM, crepitus, minimal tenderness to pressure and movement no swelling Ankles full ROM no tenderness or swelling  Investigation: No additional findings.  Imaging: No results found.  Recent Labs: Lab Results  Component Value Date   WBC 6.9 05/08/2024   HGB 14.2 05/08/2024   PLT 174.0 05/08/2024   NA 138 07/17/2024   K 4.2 07/17/2024   CL 103 07/17/2024   CO2 29 07/17/2024   GLUCOSE 60 (L) 07/17/2024   BUN 13 07/17/2024   CREATININE 0.86 07/17/2024   BILITOT 0.5 07/17/2024   ALKPHOS 149 (H) 07/17/2024   AST 147 (H) 07/17/2024   ALT 147 (H) 07/17/2024   PROT 8.3 07/17/2024   ALBUMIN 3.7 07/17/2024   CALCIUM  9.2 07/17/2024   GFRAA >60 04/17/2019    Speciality Comments: No specialty comments available.  Procedures:  No procedures performed Allergies: Penicillins and Metformin  and related   Assessment / Plan:     Visit Diagnoses: Positive ANA (antinuclear antibody)  Osteoarthritis of both hands, unspecified osteoarthritis type - Plan: XR Hand 2 View Right, XR Hand 2 View Left,Sedimentation rate, CK, CBC with Differential/Platelet, Sjogrens syndrome-B extractable nuclear antibody, Anti-DNA antibody, double-stranded, C3 and C4, Sjogrens syndrome-A extractable nuclear antibody, Anti-Smith antibody, RNP Antibody Chronic pain and swelling in multiple joints, confirmed osteoarthritis in  knees with calcium  crystals and cartilage fragments. Significant impact on daily activities.  Positive ANA likely explained by her autoimmune hepatitis with anti-smooth muscle antibodies.  Less clear about the hyperuricemia.  Rheumatoid factor is a borderline value at 14 and clinically changes are not very consistent with no active synovitis in a predominantly distal distribution in the hands. -Lab workup as above for disease specific antibodies,  checking serum inflammatory markers and muscle enzymes - Order x-rays of hands to assess joint condition and calcium  crystals. - Consider knee injection at follow-up if symptoms worsen.  Leg cramping - Plan: Magnesium  Glycinate 100 MG CAPS Muscle cramping and weakness with muscle atrophy of right thigh Severe nocturnal cramping in feet, posterior leg, and groin with right thigh atrophy and weakness in hip flexors and quadriceps. Gabapentin  and baclofen  help, but cramping persists. - Recommend magnesium  supplementation, oral or topical. - Consider EMG if cramping persists.  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 No acute gout symptoms. Discussed dietary factors including avoidance of shellfish and alcohol.  Plan to recheck serum uric acid level today.  I discussed the other most helpful test would be to get fluid aspiration during an episode of swelling but nothing present on exam today.  Autoimmune hepatitis on corticosteroid therapy Managed with prednisone , increased to 10 mg due to flare-up. Provides relief for joint symptoms.  May be masking any synovitis or effusion incidentally at this time.  Digital mucous cyst of finger Present on finger with underlying joint swelling. Not causing significant issues.        Orders: Orders Placed This Encounter  Procedures   XR Hand 2 View Right   XR Hand 2 View Left   Sedimentation rate   CK   Uric acid   CBC with Differential/Platelet    Sjogrens syndrome-B extractable nuclear antibody   Anti-DNA antibody, double-stranded   C3 and C4   Sjogrens syndrome-A extractable nuclear antibody   Anti-Smith antibody   RNP Antibody   Meds ordered this encounter  Medications   Magnesium  Glycinate 100 MG CAPS    Sig: Take 1 capsule by mouth 2 (two) times daily as needed.    Dispense:  60 capsule    Refill:  2    Follow-Up Instructions: Return in about 4 weeks (around 09/03/2024) for New pt f/u 6mo w/ injections.   Lonni LELON Ester, MD  Note - This record has been created using [employer].  Chart creation errors have been sought, but may not always  have been located. Such creation errors do not reflect on  the standard of medical care.

## 2024-08-07 ENCOUNTER — Other Ambulatory Visit: Payer: Self-pay

## 2024-08-07 LAB — SJOGRENS SYNDROME-B EXTRACTABLE NUCLEAR ANTIBODY: SSB (La) (ENA) Antibody, IgG: <1 AI

## 2024-08-07 LAB — ANTI-SMITH ANTIBODY: ENA SM Ab Ser-aCnc: <1 AI

## 2024-08-07 LAB — CBC WITH DIFFERENTIAL/PLATELET
Absolute Lymphocytes: 1029 {cells}/uL (ref 850–3900)
Absolute Monocytes: 475 {cells}/uL (ref 200–950)
Basophils Absolute: 58 {cells}/uL (ref 0–200)
Basophils Relative: 0.8 %
Eosinophils Absolute: 51 {cells}/uL (ref 15–500)
Eosinophils Relative: 0.7 %
HCT: 46.7 % — ABNORMAL HIGH (ref 35.0–45.0)
Hemoglobin: 15.3 g/dL (ref 11.7–15.5)
MCH: 29.1 pg (ref 27.0–33.0)
MCHC: 32.8 g/dL (ref 32.0–36.0)
MCV: 88.8 fL (ref 80.0–100.0)
MPV: 11.3 fL (ref 7.5–12.5)
Monocytes Relative: 6.5 %
Neutro Abs: 5687 {cells}/uL (ref 1500–7800)
Neutrophils Relative %: 77.9 %
Platelets: 235 Thousand/uL (ref 140–400)
RBC: 5.26 Million/uL — ABNORMAL HIGH (ref 3.80–5.10)
RDW: 12.6 % (ref 11.0–15.0)
Total Lymphocyte: 14.1 %
WBC: 7.3 Thousand/uL (ref 3.8–10.8)

## 2024-08-07 LAB — URIC ACID: Uric Acid, Serum: 10.3 mg/dL — ABNORMAL HIGH (ref 2.5–7.0)

## 2024-08-07 LAB — CK: Total CK: 96 U/L (ref 20–243)

## 2024-08-07 LAB — C3 AND C4
C3 Complement: 164 mg/dL (ref 83–193)
C4 Complement: 14 mg/dL — ABNORMAL LOW (ref 15–57)

## 2024-08-07 LAB — SJOGRENS SYNDROME-A EXTRACTABLE NUCLEAR ANTIBODY: SSA (Ro) (ENA) Antibody, IgG: <1 AI

## 2024-08-07 LAB — RNP ANTIBODY: Ribonucleic Protein(ENA) Antibody, IgG: <1 AI

## 2024-08-07 LAB — SEDIMENTATION RATE: Sed Rate: 28 mm/h (ref 0–30)

## 2024-08-07 LAB — ANTI-DNA ANTIBODY, DOUBLE-STRANDED: ds DNA Ab: <1 [IU]/mL

## 2024-08-08 LAB — HM DIABETES EYE EXAM

## 2024-08-10 ENCOUNTER — Other Ambulatory Visit: Payer: Self-pay

## 2024-08-10 ENCOUNTER — Encounter: Payer: Self-pay | Admitting: Radiology

## 2024-08-11 ENCOUNTER — Telehealth: Payer: Self-pay

## 2024-08-11 DIAGNOSIS — Z122 Encounter for screening for malignant neoplasm of respiratory organs: Secondary | ICD-10-CM

## 2024-08-11 DIAGNOSIS — F17210 Nicotine dependence, cigarettes, uncomplicated: Secondary | ICD-10-CM

## 2024-08-11 DIAGNOSIS — Z87891 Personal history of nicotine dependence: Secondary | ICD-10-CM

## 2024-08-11 NOTE — Telephone Encounter (Signed)
 Lung Cancer Screening Narrative/Criteria Questionnaire (Cigarette Smokers Only- No Cigars/Pipes/vapes)   Angel [name]   SDMV:08/20/2024 at 10:30 am Natalie       Feb 07, 1956               LDCT: 08/21/2024 at 2:20 pm GI                                  68 y.o.  Phone: [phone]  Lung Screening Narrative (confirm age 70-77 yrs Medicare / 50-80 yrs Private pay insurance)   Insurance information: [employer] Medicare   Referring Provider: Vicci, MD   This screening involves an initial phone call with a team member from our program. It is called a shared decision making visit. The initial meeting is required by  insurance and Medicare to make sure you understand the program. This appointment takes about 15-20 minutes to complete. You will complete the screening scan at your scheduled date/time.  This scan takes about 5-10 minutes to complete. You can eat and drink normally before and after the scan.  Criteria questions for Lung Cancer Screening:   Are you a current or former smoker? Current Age began smoking: 57   If you are a former smoker, what year did you quit smoking? Never quit over one year. (within 15 yrs)   To calculate your smoking history, I need an accurate estimate of how many packs of cigarettes you smoked per day and for how many years. (Not just the number of PPD you are now smoking)   Years smoking 47 x Packs per day 1.5 = Pack years 70.5   (at least 20 pack yrs)   (Make sure they understand that we need to know how much they have smoked in the past, not just the number of PPD they are smoking now)  Do you have a personal history of cancer? No    Do you have a family history of cancer? Yes  (cancer type and and relative) Mother had colon cancer. Father had melanoma. Sister had breast cancer.  Grandparents with cancer.   Are you coughing up blood?  No  Have you had unexplained weight loss of 15 lbs or more in the last 6 months? No  It looks like you meet all criteria.   When would be a good time for us  to schedule you for this screening?   Additional information: N/A

## 2024-08-12 ENCOUNTER — Encounter: Payer: Self-pay | Admitting: Internal Medicine

## 2024-08-17 ENCOUNTER — Other Ambulatory Visit: Payer: Self-pay

## 2024-08-20 ENCOUNTER — Encounter: Payer: Self-pay | Admitting: *Deleted

## 2024-08-20 ENCOUNTER — Ambulatory Visit (INDEPENDENT_AMBULATORY_CARE_PROVIDER_SITE_OTHER): Admitting: *Deleted

## 2024-08-20 DIAGNOSIS — F17210 Nicotine dependence, cigarettes, uncomplicated: Secondary | ICD-10-CM | POA: Diagnosis not present

## 2024-08-20 NOTE — Progress Notes (Signed)
 Virtual Visit via Telephone Note  I connected with Angel [name] on 08/20/24 at 10:30 AM EST by telephone and verified that I am speaking with the correct person using two identifiers.  Location: Patient: at home  Provider: [address] [place], [place], [address]    I discussed the limitations, risks, security and privacy concerns of performing an evaluation and management service by telephone and the availability of in person appointments. I also discussed with the patient that there may be a patient responsible charge related to this service. The patient expressed understanding and agreed to proceed.   Shared Decision Making Visit Lung Cancer Screening Program [phone])   Eligibility: Age 68 y.o. Pack Years Smoking History Calculation 72 (# packs/per year x # years smoked) Recent History of coughing up blood  no Unexplained weight loss? no ( >Than 15 pounds within the last 6 months ) Prior History Lung / other cancer no (Diagnosis within the last 5 years already requiring surveillance chest CT Scans). Smoking Status Current Smoker Former Smokers: Years since quit: n/a  Quit Date: n/a  Visit Components: Discussion included one or more decision making aids. yes Discussion included risk/benefits of screening. yes Discussion included potential follow up diagnostic testing for abnormal scans. yes Discussion included meaning and risk of over diagnosis. yes Discussion included meaning and risk of False Positives. yes Discussion included meaning of total radiation exposure. yes  Counseling Included: Importance of adherence to annual lung cancer LDCT screening. yes Impact of comorbidities on ability to participate in the program. yes Ability and willingness to under diagnostic treatment. yes  Smoking Cessation Counseling: Current Smokers:  Discussed importance of smoking cessation. yes Information about tobacco cessation classes and interventions provided to patient.  yes Patient provided with ticket for LDCT Scan. no Symptomatic Patient. no   Diagnosis Code: Tobacco Use Z72.0 Asymptomatic Patient yes  Smoking/Tobacco Cessation Counseling [name] is a current user of tobacco or nicotine  products. She is ready to quit at this time. Counseling provided today addressed the risks of continued use and the benefits of cessation. Discussed tobacco/nicotine  use history, readiness to quit, and evidence-based treatment options including behavioral strategies, support resources, and pharmacologic therapies. Provided encouragement and educational materials on steps and resources to quit smoking. Patient questions were addressed, and follow-up recommended for continued support. Total time spent on counseling: 4 minutes.   Former Smokers:  Discussed the importance of maintaining cigarette abstinence. yes Diagnosis Code: Personal History of Nicotine  Dependence. S12.108 Information about tobacco cessation classes and interventions provided to patient. Yes Patient provided with ticket for LDCT Scan. no Written Order for Lung Cancer Screening with LDCT placed in Epic. Yes (CT Chest Lung Cancer Screening Low Dose W/O CM) [id number] Z12.2-Screening of respiratory organs Z87.891-Personal history of nicotine  dependence   Laneta Speaks, RN

## 2024-08-20 NOTE — Patient Instructions (Signed)

## 2024-08-21 ENCOUNTER — Other Ambulatory Visit: Payer: Self-pay

## 2024-08-21 ENCOUNTER — Other Ambulatory Visit: Payer: Self-pay | Admitting: Pharmacist

## 2024-08-21 ENCOUNTER — Ambulatory Visit
Admission: RE | Admit: 2024-08-21 | Discharge: 2024-08-21 | Disposition: A | Discharge status: Home / Self Care | Source: Ambulatory Visit | Attending: Acute Care | Admitting: Acute Care

## 2024-08-21 DIAGNOSIS — Z122 Encounter for screening for malignant neoplasm of respiratory organs: Secondary | ICD-10-CM

## 2024-08-21 DIAGNOSIS — Z87891 Personal history of nicotine dependence: Secondary | ICD-10-CM

## 2024-08-21 DIAGNOSIS — F17210 Nicotine dependence, cigarettes, uncomplicated: Secondary | ICD-10-CM

## 2024-08-21 MED ORDER — HEPATITIS A-HEP B RECOMB VAC 720-20 ELU-MCG/ML IM SUSY
1.0000 mL | PREFILLED_SYRINGE | Freq: Once | INTRAMUSCULAR | 1 refills | Status: AC
  Filled 2024-08-21: qty 1, 1d supply, fill #0

## 2024-08-26 ENCOUNTER — Other Ambulatory Visit: Payer: Self-pay

## 2024-08-30 ENCOUNTER — Other Ambulatory Visit: Payer: Self-pay | Admitting: Critical Care Medicine

## 2024-08-31 ENCOUNTER — Other Ambulatory Visit: Payer: Self-pay | Admitting: Acute Care

## 2024-08-31 ENCOUNTER — Other Ambulatory Visit: Payer: Self-pay

## 2024-08-31 ENCOUNTER — Encounter: Payer: Self-pay | Admitting: Internal Medicine

## 2024-08-31 ENCOUNTER — Other Ambulatory Visit: Payer: Self-pay | Admitting: Internal Medicine

## 2024-08-31 DIAGNOSIS — Z87891 Personal history of nicotine dependence: Secondary | ICD-10-CM

## 2024-08-31 DIAGNOSIS — Z122 Encounter for screening for malignant neoplasm of respiratory organs: Secondary | ICD-10-CM

## 2024-08-31 DIAGNOSIS — F17210 Nicotine dependence, cigarettes, uncomplicated: Secondary | ICD-10-CM

## 2024-08-31 DIAGNOSIS — E559 Vitamin D deficiency, unspecified: Secondary | ICD-10-CM

## 2024-08-31 MED ORDER — VITAMIN D (ERGOCALCIFEROL) 1.25 MG (50000 UNIT) PO CAPS
50000.0000 [IU] | ORAL_CAPSULE | ORAL | 0 refills | Status: DC
  Filled 2024-08-31: qty 12, 84d supply, fill #0

## 2024-09-01 ENCOUNTER — Ambulatory Visit: Attending: Internal Medicine

## 2024-09-01 ENCOUNTER — Other Ambulatory Visit (INDEPENDENT_AMBULATORY_CARE_PROVIDER_SITE_OTHER)

## 2024-09-01 ENCOUNTER — Other Ambulatory Visit: Payer: Self-pay

## 2024-09-01 DIAGNOSIS — K754 Autoimmune hepatitis: Secondary | ICD-10-CM

## 2024-09-01 DIAGNOSIS — K7030 Alcoholic cirrhosis of liver without ascites: Secondary | ICD-10-CM

## 2024-09-01 DIAGNOSIS — E559 Vitamin D deficiency, unspecified: Secondary | ICD-10-CM

## 2024-09-01 LAB — COMPREHENSIVE METABOLIC PANEL WITH GFR
ALT: 27 U/L (ref 0–35)
AST: 27 U/L (ref 0–37)
Albumin: 3.7 g/dL (ref 3.5–5.2)
Alkaline Phosphatase: 99 U/L (ref 39–117)
BUN: 23 mg/dL (ref 6–23)
CO2: 29 meq/L (ref 19–32)
Calcium: 9.5 mg/dL (ref 8.4–10.5)
Chloride: 99 meq/L (ref 96–112)
Creatinine, Ser: 1.2 mg/dL (ref 0.40–1.20)
GFR: 47.33 mL/min — ABNORMAL LOW (ref >60.00)
Glucose, Bld: 278 mg/dL — ABNORMAL HIGH (ref 70–99)
Potassium: 4 meq/L (ref 3.5–5.1)
Sodium: 134 meq/L — ABNORMAL LOW (ref 135–145)
Total Bilirubin: 0.5 mg/dL (ref 0.2–1.2)
Total Protein: 7.8 g/dL (ref 6.0–8.3)

## 2024-09-01 LAB — PROTIME-INR
INR: 1.2 ratio — ABNORMAL HIGH (ref 0.8–1.0)
Prothrombin Time: 12.2 s (ref 9.6–13.1)

## 2024-09-01 LAB — HEPATIC FUNCTION PANEL
ALT: 27 U/L (ref 0–35)
AST: 27 U/L (ref 0–37)
Albumin: 3.7 g/dL (ref 3.5–5.2)
Alkaline Phosphatase: 99 U/L (ref 39–117)
Bilirubin, Direct: 0.1 mg/dL (ref 0.0–0.3)
Total Bilirubin: 0.5 mg/dL (ref 0.2–1.2)
Total Protein: 7.8 g/dL (ref 6.0–8.3)

## 2024-09-02 ENCOUNTER — Other Ambulatory Visit: Payer: Self-pay

## 2024-09-02 ENCOUNTER — Other Ambulatory Visit: Payer: Self-pay | Admitting: Internal Medicine

## 2024-09-02 ENCOUNTER — Ambulatory Visit: Payer: Self-pay | Admitting: Internal Medicine

## 2024-09-02 LAB — VITAMIN D 25 HYDROXY (VIT D DEFICIENCY, FRACTURES): Vit D, 25-Hydroxy: 70.3 ng/mL (ref 30.0–100.0)

## 2024-09-02 MED ORDER — VITAMIN D (ERGOCALCIFEROL) 1.25 MG (50000 UNIT) PO CAPS
50000.0000 [IU] | ORAL_CAPSULE | ORAL | 0 refills | Status: AC
  Filled 2024-09-02: qty 12, fill #0
  Filled 2024-09-10: qty 6, 84d supply, fill #0

## 2024-09-08 ENCOUNTER — Ambulatory Visit: Payer: Self-pay | Admitting: Gastroenterology

## 2024-09-08 NOTE — Progress Notes (Signed)
 Angel [name],  Your liver enzymes have improved significantly with the addition of the prednisone .  Do you have an appointment yet with [facility]?

## 2024-09-08 NOTE — Telephone Encounter (Signed)
 Patient does have an appointment with [facility] on 11/25/24.

## 2024-09-10 ENCOUNTER — Encounter: Payer: Self-pay | Admitting: Internal Medicine

## 2024-09-10 ENCOUNTER — Ambulatory Visit: Attending: Internal Medicine | Admitting: Internal Medicine

## 2024-09-10 ENCOUNTER — Other Ambulatory Visit: Payer: Self-pay

## 2024-09-10 VITALS — BP 120/76 | HR 80 | Temp 97.6°F | Resp 18 | Ht 66.0 in | Wt 170.6 lb

## 2024-09-10 DIAGNOSIS — G6289 Other specified polyneuropathies: Secondary | ICD-10-CM | POA: Diagnosis not present

## 2024-09-10 DIAGNOSIS — R252 Cramp and spasm: Secondary | ICD-10-CM

## 2024-09-10 MED ORDER — GABAPENTIN 300 MG PO CAPS
300.0000 mg | ORAL_CAPSULE | Freq: Two times a day (BID) | ORAL | 2 refills | Status: AC
  Filled 2024-09-10: qty 60, 30d supply, fill #0
  Filled 2024-10-13: qty 60, 30d supply, fill #1
  Filled 2024-11-09: qty 60, 30d supply, fill #2

## 2024-09-10 NOTE — Progress Notes (Signed)
 Office Visit Note  Patient: Angel [name]             Date of Birth: 10-Sep-1956           MRN: [id number]             PCP: Vicci Barnie NOVAK, MD Referring: Vicci Barnie NOVAK, MD Visit Date: 09/10/2024   Subjective:   Discussed the use of AI scribe software for clinical note transcription with the patient, who gave verbal consent to proceed.  History of Present Illness   Angel [name] is a 68 year old female with osteoarthritis and gout who presents with severe cramping in her hands and legs. She remains on prednisone  10 mg daily for autoimmune hepatitis. We saw her for evaluation of this pain along with borderline positive RF last month and workup was benign on labs, uric acid remained high at 10.2 although lower than previous. 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  She has been experiencing severe cramping in her hands and legs, particularly at night, over the past few days. The cramping in her hands has also increased in frequency during the day. The pain is described as severe, especially in the joints, and sometimes prevents her from performing daily activities such as showering.  She is currently taking gabapentin , 100 mg, two capsules twice a day, and prednisone , 10 mg daily. Gabapentin  helps with most of the pain, but not the cramping. She has been on this dose of gabapentin  for about six months and has been back on prednisone  for two months, which she feels has improved her condition.  She mentions a history of back problems and has experienced a recent fall due to balance issues. She takes a total of nineteen pills daily, which she feels affects her stomach.  No significant swelling associated with joint pain.      Previous HPI 08/06/24 Angel [name] is a 68 year old female with autoimmune hepatitis who presents with severe muscle cramping and joint swelling for evaluation with positive ANA, RF, and hyperurecemia.   She experiences severe  muscle cramping, primarily at night, affecting her feet, the back of her legs, and her groin muscle. The cramping is debilitating, causing instability and a fear of falling. It is unpredictable, often occurring when she is lying down, and is not linked to any specific food or activity. She uses THC and CBD ointments for relief and takes gabapentin  and baclofen  regularly, but still experiences cramping.   She has a history of autoimmune hepatitis and has been on prednisone , which she finds helpful. Recently, she was put back on prednisone  due to a flare-up. She was on low dose prednisone  for a long time previously with some associated chronic bruising, diabetes, candidiasis possible related.   She has elevated uric acid levels, with a recent measurement of 12.1, which is significantly above normal. She avoids alcohol and shellfish, which are known to exacerbate gout, and primarily consumes chicken and white fish. She does not report episodes of definite joint swelling except occasionally in her hands and wrists and knees, but often without much associated pain. Her family history includes rheumatoid arthritis. Her fingers occasionally lock up, and she has noticed swelling in her elbows.   She reports chronic pain in her feet, which has been worsening over the past six months. The pain is constant, even when not cramping, and she describes it as 'hurting all the time.' No numbness, but the pain affects her stability  and mobility.   She has a history of back surgery and reports the development of lipomas in the area since the surgery.   She has not undergone physical therapy for her symptoms but received a knee injection from an orthopedist about six to eight months ago, which provided significant relief. Xrays had indicated osteoarthritis as well as joint effusion and chondrocalcinosis.      Labs reviewed 07/2024 ASMA 73 IgG 2,523 AST 147 ALT 147   02/2024 ANA 1:320 homogenous Uric acid 12.1 RF  14 ESR 14   Review of Systems  Constitutional:  Positive for fatigue.  HENT:  Positive for mouth sores. Negative for mouth dryness.   Eyes:  Negative for dryness.  Respiratory:  Negative for shortness of breath.   Cardiovascular:  Negative for chest pain and palpitations.  Gastrointestinal:  Negative for blood in stool, constipation and diarrhea.  Endocrine: Negative for increased urination.  Genitourinary:  Negative for involuntary urination.  Musculoskeletal:  Positive for joint pain, gait problem, joint pain, joint swelling, myalgias, muscle weakness, morning stiffness, muscle tenderness and myalgias.  Skin:  Positive for hair loss. Negative for color change, rash and sensitivity to sunlight.  Allergic/Immunologic: Negative for susceptible to infections.  Neurological:  Negative for dizziness and headaches.  Hematological:  Positive for swollen glands.  Psychiatric/Behavioral:  Positive for depressed mood and sleep disturbance. The patient is nervous/anxious.     PMFS History:  Patient Active Problem List   Diagnosis Date Noted   Hyperuricemia 08/06/2024   Rheumatoid factor positive 08/06/2024   Osteoarthritis of hands, bilateral 08/06/2024   Leg cramping 08/06/2024   Leg weakness, bilateral 08/06/2024   Positive ANA (antinuclear antibody) 08/06/2024   Oral candidiasis 08/04/2024   Esophageal varices in cirrhosis (HCC) 06/15/2024   New onset type 2 diabetes mellitus (HCC) 02/21/2024   Benign disease of right breast 11/06/2022   Aortic atherosclerosis 04/19/2022   Vitamin D  deficiency 04/19/2022   Iron overload 04/19/2022   Cirrhosis (HCC) 04/12/2022   Malnutrition of moderate degree 04/12/2022   HTN (hypertension) 04/11/2022   HLD (hyperlipidemia) 04/11/2022   Hypoalbuminemia 04/11/2022   Alcoholic cirrhosis of liver with ascites (HCC) 04/11/2022   Bronchiectasis without acute exacerbation (HCC) 03/06/2014   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 02/19/2014    Environmental allergies 02/19/2014   Cigarette smoker 12/31/2013    Past Medical History:  Diagnosis Date   Alcohol use 04/19/2022   Alcoholic cirrhosis (HCC)    Asthma    Bronchiolitis    Cirrhosis (HCC)    COPD (chronic obstructive pulmonary disease) (HCC)    Hypertension    Smoker 12/31/2013    Family History  Problem Relation Age of Onset   Breast cancer Mother    Colon cancer Mother    CAD Father    Heart disease Father    Breast cancer Sister    Healthy Sister    Emphysema Paternal Grandfather        never smoker, theatre stage manager   Cancer Cousin        blood cancer, unknown type   Liver disease Neg Hx    Esophageal cancer Neg Hx    Past Surgical History:  Procedure Laterality Date   ABDOMINAL HYSTERECTOMY     APPENDECTOMY     BACK SURGERY     Social History   Social History Narrative   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   Now has her own education officer, 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  Never pregnant, NO husband   Social drinkier    Quite smoking 3/15   Immunization History  Administered Date(s) Administered   Hep A / Hep B 07/23/2024, 08/21/2024   INFLUENZA, HIGH DOSE SEASONAL PF 06/15/2024   Influenza, Seasonal, Injecte, Preservative Fre 06/04/2023   Influenza,inj,Quad PF,6+ Mos 06/18/2022   Moderna Covid-19 Vaccine Bivalent Booster 51yrs & up 06/25/2023   PNEUMOCOCCAL CONJUGATE-20 06/18/2022   Pfizer(Comirnaty )Fall Seasonal Vaccine 12 years and older 09/26/2022   Pneumococcal Polysaccharide-23 12/31/2013   Tdap 08/16/2022   Zoster Recombinant(Shingrix ) 06/14/2023, 08/23/2023     Objective: Vital Signs: BP 120/76   Pulse 80   Temp 97.6 F (36.4 C)   Resp 18   Ht 5' 6 (1.676 m)   Wt 170 lb 9.6 oz (77.4 kg)   BMI 27.54 kg/m    Physical Exam Eyes:     Conjunctiva/sclera: Conjunctivae normal.  Cardiovascular:     Rate and Rhythm: Normal rate and regular rhythm.  Pulmonary:     Effort: Pulmonary effort is normal.     Breath  sounds: Normal breath sounds.  Musculoskeletal:     Right lower leg: No edema.     Left lower leg: No edema.  Skin:    General: Skin is warm and dry.     Findings: No rash.  Neurological:     Mental Status: She is alert.  Psychiatric:        Mood and Affect: Mood normal.      Musculoskeletal Exam:  Shoulders full ROM no tenderness or swelling Elbows full ROM no tenderness or swelling Wrists full ROM no tenderness or swelling DIP heberdon's nodes b/l hands worst 1st-3rd digits, no palpable synovitis no tenderness to pressure Some muscle loss or atrophy on right thigh compared to left Knees full ROM, crepitus, minimal tenderness to pressure and movement no swelling Ankles full ROM no tenderness or swelling  Investigation: No additional findings.  Imaging: CT CHEST LUNG CA SCREEN LOW DOSE W/O CM Result Date: 08/28/2024 CLINICAL DATA:  68 year old female current smoker 31 pack-year smoking history. EXAM: CT CHEST WITHOUT CONTRAST LOW-DOSE FOR LUNG CANCER SCREENING TECHNIQUE: Multidetector CT imaging of the chest was performed following the standard protocol without IV contrast. RADIATION DOSE REDUCTION: This exam was performed according to the departmental dose-optimization program which includes automated exposure control, adjustment of the mA and/or kV according to patient size and/or use of iterative reconstruction technique. COMPARISON:  08/25/2023 chest CT angiogram. FINDINGS: Cardiovascular: Normal heart size. No significant pericardial effusion/thickening. Three-vessel coronary atherosclerosis. Atherosclerotic nonaneurysmal thoracic aorta. Normal caliber pulmonary arteries. Mediastinum/Nodes: No significant thyroid  nodules. Unremarkable esophagus. No pathologically enlarged axillary, mediastinal or hilar lymph nodes, noting limited sensitivity for the detection of hilar adenopathy on this noncontrast study. Lungs/Pleura: No pneumothorax. No pleural effusion. Mild centrilobular  emphysema with diffuse bronchial wall thickening. No acute consolidative airspace disease or lung masses. No significant pulmonary nodules. Chronic mild bronchiectasis, bronchial wall thickening and patchy mucoid impaction in the right upper lobe. Upper abdomen: Diffusely irregular liver surface compatible with cirrhosis. Musculoskeletal: No aggressive appearing focal osseous lesions. Mild to moderate thoracic spondylosis. IMPRESSION: 1. Lung-RADS 1, negative. Continue annual screening with low-dose chest CT without contrast in 12 months. 2. Chronic mild bronchiectasis, bronchial wall thickening and patchy mucoid impaction in the right upper lobe. 3. Three-vessel coronary atherosclerosis. 4. Cirrhosis. 5. Aortic Atherosclerosis (ICD10-I70.0) and Emphysema (ICD10-J43.9). Electronically Signed   By: Selinda DELENA Blue M.D.   On: 08/28/2024 15:06    Recent Labs: Lab Results  Component Value Date   WBC 7.3 08/06/2024   HGB 15.3 08/06/2024   PLT 235 08/06/2024   NA 134 (L) 09/01/2024   K 4.0 09/01/2024   CL 99 09/01/2024   CO2 29 09/01/2024   GLUCOSE 278 (H) 09/01/2024   BUN 23 09/01/2024   CREATININE 1.20 09/01/2024   BILITOT 0.5 09/01/2024   BILITOT 0.5 09/01/2024   ALKPHOS 99 09/01/2024   ALKPHOS 99 09/01/2024   AST 27 09/01/2024   AST 27 09/01/2024   ALT 27 09/01/2024   ALT 27 09/01/2024   PROT 7.8 09/01/2024   PROT 7.8 09/01/2024   ALBUMIN 3.7 09/01/2024   ALBUMIN 3.7 09/01/2024   CALCIUM  9.5 09/01/2024   GFRAA >60 04/17/2019    Speciality Comments: No specialty comments available.  Procedures:  No procedures performed Allergies: Penicillins and Metformin  and related   Assessment / Plan:     Visit Diagnoses: Leg cramping Other polyneuropathy - Plan: gabapentin  (NEURONTIN ) 300 MG capsule Severe cramping, particularly at night, with significant pain. Gabapentin  is currently used but may be insufficient. Symptoms suggest nerve-related issues, possibly peripheral neuropathy. Nerve  conduction study is planned to assess nerve function and identify potential causes. - Increased gabapentin  to 300 mg capsules, one pill twice a day. - Consider nerve conduction study to assess nerve function, although evaluation already with Dr. Margaret 05/2024 - Continuing current dose of prednisone  for hepatitis, although likely worsening diabetes control  Hand osteoarthritis Chronic osteoarthritis in the hands, more severe in the right hand, with bone spurring and joint flattening. Pain is significant, particularly in the right hand, with decreased mobility. - Continue current management as symptoms are not acute.  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 Gout with persistent hyperuricemia. She had significant swelling before but not present today. I suspect the steroid maintenance is suppressing inflammatory symptoms. I do not think her symptoms would benefit with urate lowering therapy at this time unless we start seeing flares again, and she strongly preferred against extra daily pills if possible. - Continue to monitor for any new symptoms or changes in condition.  Orders: No orders of the defined types were placed in this encounter.  Meds ordered this encounter  Medications   gabapentin  (NEURONTIN ) 300 MG capsule    Sig: Take 1 capsule (300 mg total) by mouth 2 (two) times daily.    Dispense:  60 capsule    Refill:  2     Follow-Up Instructions: Return in about 3 months (around 12/09/2024) for Nerve/AIH/?CPPD/?gout f/u 3mos.   Lonni LELON Ester, MD  Note - This record has been created using [employer].  Chart creation errors have been sought, but may not always  have been located. Such creation errors do not reflect on  the standard of medical care.

## 2024-09-13 ENCOUNTER — Other Ambulatory Visit: Payer: Self-pay | Admitting: Gastroenterology

## 2024-09-14 ENCOUNTER — Other Ambulatory Visit: Payer: Self-pay

## 2024-09-14 MED ORDER — SPIRONOLACTONE 50 MG PO TABS
50.0000 mg | ORAL_TABLET | Freq: Every day | ORAL | 2 refills | Status: AC
  Filled 2024-09-14: qty 30, 30d supply, fill #0
  Filled 2024-10-15: qty 30, 30d supply, fill #1
  Filled 2024-11-10: qty 30, 30d supply, fill #2

## 2024-09-14 MED ORDER — FUROSEMIDE 20 MG PO TABS
20.0000 mg | ORAL_TABLET | Freq: Every day | ORAL | 3 refills | Status: AC
  Filled 2024-09-14: qty 30, 30d supply, fill #0
  Filled 2024-10-14: qty 30, 30d supply, fill #1
  Filled 2024-11-10: qty 30, 30d supply, fill #2

## 2024-09-15 ENCOUNTER — Other Ambulatory Visit: Payer: Self-pay

## 2024-09-17 ENCOUNTER — Other Ambulatory Visit: Payer: Self-pay

## 2024-09-23 ENCOUNTER — Encounter: Payer: Self-pay | Admitting: Internal Medicine

## 2024-10-03 ENCOUNTER — Other Ambulatory Visit: Payer: Self-pay | Admitting: Gastroenterology

## 2024-10-05 ENCOUNTER — Other Ambulatory Visit: Payer: Self-pay

## 2024-10-05 ENCOUNTER — Encounter: Payer: Self-pay | Admitting: Gastroenterology

## 2024-10-05 MED ORDER — PREDNISONE 10 MG PO TABS
10.0000 mg | ORAL_TABLET | Freq: Every day | ORAL | 1 refills | Status: AC
  Filled 2024-10-05: qty 30, 30d supply, fill #0
  Filled 2024-11-09: qty 30, 30d supply, fill #1

## 2024-10-06 ENCOUNTER — Other Ambulatory Visit: Payer: Self-pay

## 2024-10-13 ENCOUNTER — Other Ambulatory Visit: Payer: Self-pay

## 2024-10-13 ENCOUNTER — Other Ambulatory Visit: Payer: Self-pay | Admitting: *Deleted

## 2024-10-13 DIAGNOSIS — R29898 Other symptoms and signs involving the musculoskeletal system: Secondary | ICD-10-CM

## 2024-10-13 NOTE — Telephone Encounter (Signed)
 I think my note was unclear. She did already have a clinic encounter with examination by Dr. Margaret, but he did not perform a nerve conduction study that I can see. He was giving treatment recommendations for diabetic peripheral neuropathy as the clinical diagnosis. I was recommending her for getting NCS done to see if there are any other localized/reversible abnormalities or if she just needs to continue her current medical management.

## 2024-10-14 LAB — OPHTHALMOLOGY REPORT-SCANNED

## 2024-10-14 LAB — HM DIABETES EYE EXAM

## 2024-10-14 NOTE — Telephone Encounter (Signed)
 I called patient, referral in review.

## 2024-10-15 ENCOUNTER — Other Ambulatory Visit: Payer: Self-pay | Admitting: Internal Medicine

## 2024-10-15 ENCOUNTER — Ambulatory Visit: Attending: Internal Medicine | Admitting: Internal Medicine

## 2024-10-15 ENCOUNTER — Other Ambulatory Visit: Payer: Self-pay

## 2024-10-15 VITALS — BP 125/76 | HR 87 | Temp 98.0°F | Ht 66.0 in | Wt 172.0 lb

## 2024-10-15 DIAGNOSIS — K7030 Alcoholic cirrhosis of liver without ascites: Secondary | ICD-10-CM | POA: Diagnosis not present

## 2024-10-15 DIAGNOSIS — E1142 Type 2 diabetes mellitus with diabetic polyneuropathy: Secondary | ICD-10-CM | POA: Insufficient documentation

## 2024-10-15 DIAGNOSIS — J449 Chronic obstructive pulmonary disease, unspecified: Secondary | ICD-10-CM | POA: Diagnosis not present

## 2024-10-15 DIAGNOSIS — Z7984 Long term (current) use of oral hypoglycemic drugs: Secondary | ICD-10-CM | POA: Diagnosis not present

## 2024-10-15 DIAGNOSIS — L309 Dermatitis, unspecified: Secondary | ICD-10-CM

## 2024-10-15 DIAGNOSIS — G621 Alcoholic polyneuropathy: Secondary | ICD-10-CM | POA: Diagnosis not present

## 2024-10-15 DIAGNOSIS — J479 Bronchiectasis, uncomplicated: Secondary | ICD-10-CM

## 2024-10-15 LAB — POCT GLYCOSYLATED HEMOGLOBIN (HGB A1C): HbA1c, POC (controlled diabetic range): 8.2 % — AB (ref 0.0–7.0)

## 2024-10-15 LAB — GLUCOSE, POCT (MANUAL RESULT ENTRY): POC Glucose: 306 mg/dL — AB (ref 70–99)

## 2024-10-15 MED ORDER — HYDROXYZINE HCL 25 MG PO TABS
25.0000 mg | ORAL_TABLET | Freq: Every day | ORAL | 1 refills | Status: AC | PRN
  Filled 2024-10-15 – 2024-11-09 (×2): qty 30, 30d supply, fill #0

## 2024-10-15 MED ORDER — GLIMEPIRIDE 2 MG PO TABS
2.0000 mg | ORAL_TABLET | Freq: Every day | ORAL | 3 refills | Status: AC
  Filled 2024-10-15: qty 90, 90d supply, fill #0

## 2024-10-15 MED ORDER — OMEPRAZOLE 20 MG PO CPDR
20.0000 mg | DELAYED_RELEASE_CAPSULE | Freq: Every day | ORAL | 1 refills | Status: AC
  Filled 2024-10-15: qty 90, 90d supply, fill #0

## 2024-10-15 NOTE — Patient Instructions (Signed)
" °  VISIT SUMMARY: Today, we discussed your diabetes management, liver cirrhosis, COPD, neuropathy, and other health concerns. Your A1c has increased, indicating that your blood sugar levels are not well controlled. We have made some changes to your medications and provided dietary advice to help manage your conditions better.  YOUR PLAN: -TYPE 2 DIABETES MELLITUS WITH DIABETIC POLYNEUROPATHY: Your blood sugar levels are higher than desired, and your A1c has increased to 8.2%. This means your diabetes is not well controlled. We have started you on glimepiride  2 mg daily to help lower your blood sugar. Please check your blood sugar daily before breakfast, aiming for levels between 90-130 mg/dL. We also discussed the signs of low blood sugar and how to manage it. Additionally, you should reduce high-sugar fruits and increase lower-sugar fruits in your diet.  -ALCOHOLIC CIRRHOSIS OF LIVER: Your liver condition is stable with no fluid buildup. Continue taking spironolactone  50 mg daily and furosemide  40 mg daily as prescribed.  -CHRONIC OBSTRUCTIVE PULMONARY DISEASE AND BRONCHIECTASIS: Your lung condition is being managed with inhalers. To prevent thrush, rinse your mouth with warm water after using your inhalers. Continue using your albuterol  and Breztri  inhalers as directed.  -ALCOHOLIC POLYNEUROPATHY: Your nerve pain is likely due to a combination of diabetes, past alcohol use, and liver disease. The increased dose of gabapentin  to 300 mg twice daily has been helping. Continue with this medication and await the results of your nerve conduction study.  -CHRONIC RIGHT LOWER LEG SKIN LESION: You have a painful, raised spot on your right leg that needs further evaluation. We have referred you to a dermatologist for a biopsy. Please try to expedite this appointment or get on a cancellation list.  -OSTEOARTHRITIS: You have been diagnosed with osteoarthritis, which is a condition that causes joint pain and  stiffness. There is no current diagnosis of rheumatoid arthritis.  INSTRUCTIONS: Please follow up with the dermatologist for your leg lesion as soon as possible. Continue to monitor your blood sugar daily and follow the dietary recommendations provided. Await the results of your nerve conduction study. If you experience any new or worsening symptoms, please contact our office.                      Contains text generated by [employer].                                 Contains text generated by [employer].   "

## 2024-10-15 NOTE — Progress Notes (Addendum)
 "   Patient ID: Angel [name], female    DOB: [birth year]  MRN: [id number]  CC: Diabetes (DM f/u. [facility] mammogram/Red bumps on RT leg & spreading /Requesting referral for hypnosis to quit moking - ins will cover/Discuss melatonin/Already recevied flu vax. )   Subjective: Angel [name] is a 69 y.o. female who presents for chronic ds management. Her chronic medical issues include:  Patient with history of HTN, DM,aortic atherosclerosis, osteopenia, tobacco dependence, COPD/bronchiectasis, EtOH cirrhosis, HL, vitamin D  deficiency, malnutrition, osteopenia, strong fhx of RA   Discussed the use of AI scribe software for clinical note transcription with the patient, who gave verbal consent to proceed.  History of Present Illness Angel [name] is a 69 year old female with diabetes and cirrhosis who presents for follow-up of her diabetes management.  DM:  Results for orders placed or performed in visit on 10/15/24  POCT glucose (manual entry)   Collection Time: 10/15/24  1:46 PM  Result Value Ref Range   POC Glucose 306 (A) 70 - 99 mg/dl  POCT glycosylated hemoglobin (Hb A1C)   Collection Time: 10/15/24  1:48 PM  Result Value Ref Range   Hemoglobin A1C     HbA1c POC (<> result, manual entry)     HbA1c, POC (prediabetic range)     HbA1c, POC (controlled diabetic range) 8.2 (A) 0.0 - 7.0 %  She had a diabetic eye exam on October 14, 2024, which showed no diabetic retinopathy. She brings copy of report for our records. Not on med for DM.  Her A1c has increased from 6.9% four months ago to 8.2% currently, with a blood sugar reading of 306 mg/dL today.  Since last visit with me, she has been placed on prednisone  10 mg daily by her gastroenterologist she states for possible autoimmune component to her cirrhosis.  She checks her blood sugar daily, usually before breakfast, but cannot recall the exact numbers. She has difficulty with dietary control, particularly over the holidays, and has been  trying to reduce sugary foods and drinks. She previously took metformin  but experienced stomach upset.  She reports weight gain, attributing it to stress and poor eating habits. She is trying to avoid sugary drinks, switching to caffeine-free tea with artificial sweeteners and drinking more water. She consumes sugar-free popsicles daily and enjoys grapes, which she now knows can spike her blood sugar. She is trying to incorporate more fruits and vegetables into her diet.  ETOH Cirrhosis:  She takes spironolactone  50 mg daily, furosemide  20 mg daily, and lactulose  to manage her cirrhosis, aiming for two to three soft bowel movements daily.   She reports improvement in cramping issues and initially thought she had rheumatoid arthritis, but her chart shows a positive rheumatoid factor and she has been told it is osteoarthritis. She experiences pain in her right side, which she associates with coughing.  She has a painful, raised spot on her right leg that has been present for a few months.  She has a yearly appointment with dermatology coming up in March but wonders about whether she needs to get in much sooner because of this new lesion.  She has COPD and bronchiectasis, for which she uses albuterol  and Breztri  inhalers, and occasionally a nebulizer.  She feels she is doing okay on her inhalers.  She is followed by pulmonologist Dr. Darlean.   She also reports a sore throat in the mornings and occasional thrush, which she attributes to her inhaler use.  She has a  history of peripheral neuropathy, likely related to her past alcohol use and diabetes, and is on gabapentin , which was recently increased to 300 mg twice daily by Dr Jeannetta, helping with her symptoms. he is awaiting a nerve conduction study for her neuropathy. She experiences cramping in her hands and feet, she finds bothersome but is helped by Gabapentin .   She inquires about melatonin use for sleep, which she takes occasionally at a dose of 2 mg.  She has been sleeping better since a Gabapentin  dose was increased recently.     Patient Active Problem List   Diagnosis Date Noted   Diabetic polyneuropathy associated with type 2 diabetes mellitus (HCC) 10/15/2024   Hyperuricemia 08/06/2024   Rheumatoid factor positive 08/06/2024   Osteoarthritis of hands, bilateral 08/06/2024   Leg cramping 08/06/2024   Leg weakness, bilateral 08/06/2024   Positive ANA (antinuclear antibody) 08/06/2024   Oral candidiasis 08/04/2024   New onset type 2 diabetes mellitus (HCC) 02/21/2024   Benign disease of right breast 11/06/2022   Aortic atherosclerosis 04/19/2022   Vitamin D  deficiency 04/19/2022   Iron overload 04/19/2022   Cirrhosis (HCC) 04/12/2022   Malnutrition of moderate degree 04/12/2022   HTN (hypertension) 04/11/2022   HLD (hyperlipidemia) 04/11/2022   Hypoalbuminemia 04/11/2022   Alcoholic cirrhosis of liver with ascites (HCC) 04/11/2022   Bronchiectasis without acute exacerbation (HCC) 03/06/2014   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 02/19/2014   Environmental allergies 02/19/2014   Cigarette smoker 12/31/2013     Medications Ordered Prior to Encounter[1]  Allergies[2]  Social History   Socioeconomic History   Marital status: Single    Spouse name: Not on file   Number of children: 0   Years of education: Not on file   Highest education level: Associate degree: occupational, scientist, product/process development, or vocational program  Occupational History   Occupation: Unemployed  Tobacco Use   Smoking status: Every Day    Current packs/day: 0.25    Average packs/day: 1.5 packs/day for 47.8 years (71.3 ttl pk-yrs)    Types: Cigarettes    Start date: 12/30/1976    Passive exposure: Never   Smokeless tobacco: Never   Tobacco comments:    Smokes 5 cigarettes a day. 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    Never pregnant, NO husband   Social drinkier    Quite smoking 3/15   Social Drivers of Health   Tobacco Use: High Risk (09/10/2024)   Patient History    Smoking Tobacco Use: Every Day    Smokeless Tobacco Use: Never    Passive Exposure: Never  Financial Resource Strain: Low Risk (10/13/2024)   Overall Financial Resource Strain (CARDIA)    Difficulty of Paying Living Expenses: Not hard at all  Food Insecurity: No Food Insecurity (10/13/2024)   Epic    Worried About Radiation Protection Practitioner of Food in the Last Year: Never true    Ran Out of Food in the Last Year: Never true  Transportation Needs: No Transportation Needs (10/13/2024)   Epic  Lack of Transportation (Medical): No    Lack of Transportation (Non-Medical): No  Physical Activity: Inactive (10/13/2024)   Exercise Vital Sign    Days of Exercise per Week: 0 days    Minutes of Exercise per Session: Not on file  Stress: Stress Concern Present (10/13/2024)   [employer] of Occupational Health - Occupational Stress Questionnaire    Feeling of Stress: To some extent  Social Connections: Socially Isolated (10/13/2024)   Social Connection and Isolation Panel    Frequency of Communication with Friends and Family: More than three times a week    Frequency of Social Gatherings with Friends and Family: Once a week    Attends Religious Services: Never    Database Administrator or Organizations: No    Attends Engineer, Structural: Not on file    Marital Status: Divorced  Intimate Partner Violence: Not At Risk (11/14/2023)   Humiliation, Afraid, Rape, and Kick questionnaire    Fear of Current or Ex-Partner: No    Emotionally Abused: No    Physically Abused: No    Sexually  Abused: No  Depression (PHQ2-9): Medium Risk (02/13/2024)   Depression (PHQ2-9)    PHQ-2 Score: 6  Alcohol Screen: Low Risk (11/14/2023)   Alcohol Screen    Last Alcohol Screening Score (AUDIT): 0  Housing: Low Risk (10/13/2024)   Epic    Unable to Pay for Housing in the Last Year: No    Number of Times Moved in the Last Year: 0    Homeless in the Last Year: No  Utilities: Not At Risk (11/14/2023)   AHC Utilities    Threatened with loss of utilities: No  Health Literacy: Adequate Health Literacy (11/14/2023)   B1300 Health Literacy    Frequency of need for help with medical instructions: Never    Family History  Problem Relation Age of Onset   Breast cancer Mother    Colon cancer Mother    CAD Father    Heart disease Father    Breast cancer Sister    Healthy Sister    Emphysema Paternal Grandfather        never smoker, theatre stage manager   Cancer Cousin        blood cancer, unknown type   Liver disease Neg Hx    Esophageal cancer Neg Hx     Past Surgical History:  Procedure Laterality Date   ABDOMINAL HYSTERECTOMY     APPENDECTOMY     BACK SURGERY      ROS: Review of Systems Negative except as stated above  PHYSICAL EXAM: BP 125/76 (BP Location: Left Arm, Patient Position: Sitting, Cuff Size: Normal)   Pulse 87   Temp 98 F (36.7 C) (Oral)   Ht 5' 6 (1.676 m)   Wt 172 lb (78 kg)   SpO2 94%   BMI 27.76 kg/m   Physical Exam  General appearance - alert, well appearing, older caucasian female and in no distress Mental status - normal mood, behavior, speech, dress, motor activity, and thought processes Neck - supple, no significant adenopathy Mouth: Oral mucosa is moist.  No lesions seen.  No thrush. Chest - clear to auscultation, no wheezes, rales or rhonchi, symmetric air entry Heart - normal rate, regular rhythm, normal S1, S2, no murmurs, rubs, clicks or gallops Extremities - peripheral pulses normal, no pedal edema, no clubbing or cyanosis Skin - pt has a red  raised, rough appearing 3x 3 cm lesion on RT upper posterior calf  Latest Ref Rng & Units 09/01/2024    4:03 PM 07/17/2024   11:27 AM 05/28/2024    3:44 PM  CMP  Glucose 70 - 99 mg/dL 721  60  78   BUN 6 - 23 mg/dL 23  13  17    Creatinine 0.40 - 1.20 mg/dL 8.79  9.13  9.09   Sodium 135 - 145 mEq/L 134  138  136   Potassium 3.5 - 5.1 mEq/L 4.0  4.2  4.1   Chloride 96 - 112 mEq/L 99  103  102   CO2 19 - 32 mEq/L 29  29  25    Calcium  8.4 - 10.5 mg/dL 9.5  9.2  9.4   Total Protein 6.0 - 8.3 g/dL 6.0 - 8.3 g/dL 7.8    7.8  8.3  8.5   Total Bilirubin 0.2 - 1.2 mg/dL 0.2 - 1.2 mg/dL 0.5    0.5  0.5  0.5   Alkaline Phos 39 - 117 U/L 39 - 117 U/L 99    99  149  145   AST 0 - 37 U/L 0 - 37 U/L 27    27  147  65   ALT 0 - 35 U/L 0 - 35 U/L 27    27  147  63    Lipid Panel     Component Value Date/Time   CHOL 121 04/19/2022 1207   TRIG 62 04/19/2022 1207   HDL 36 (L) 04/19/2022 1207   CHOLHDL 3.4 04/19/2022 1207   CHOLHDL 4.5 02/19/2014 1226   VLDL NOT CALC 02/19/2014 1226   LDLCALC 72 04/19/2022 1207    CBC    Component Value Date/Time   WBC 7.3 08/06/2024 1441   RBC 5.26 (H) 08/06/2024 1441   HGB 15.3 08/06/2024 1441   HGB 15.7 06/04/2023 1411   HCT 46.7 (H) 08/06/2024 1441   HCT 45.1 06/04/2023 1411   PLT 235 08/06/2024 1441   PLT 208 06/04/2023 1411   MCV 88.8 08/06/2024 1441   MCV 87 06/04/2023 1411   MCH 29.1 08/06/2024 1441   MCHC 32.8 08/06/2024 1441   RDW 12.6 08/06/2024 1441   RDW 13.2 06/04/2023 1411   LYMPHSABS 1.9 05/08/2024 1631   LYMPHSABS 2.5 06/04/2023 1411   MONOABS 0.8 05/08/2024 1631   EOSABS 51 08/06/2024 1441   EOSABS 0.1 06/04/2023 1411   BASOSABS 58 08/06/2024 1441   BASOSABS 0.1 06/04/2023 1411    ASSESSMENT AND PLAN: 1. Type 2 diabetes mellitus with peripheral neuropathy (HCC) (Primary) Not at goal with increase in her A1c since last visit.  Most likely due to the fact that she is now on low-dose prednisone  10 mg daily.   We will add Amaryl  2 mg daily.  Encouraged her to continue to check blood sugars.  Went over signs and symptoms of hypoglycemia and how to treat. - POCT glucose (manual entry) - POCT glycosylated hemoglobin (Hb A1C)  2. Alcoholic peripheral neuropathy Continue gabapentin .  Remain free of alcohol.  3. Alcoholic cirrhosis of liver without ascites (HCC) Stable and compensated.  Continue spironolactone , furosemide  and lactulose .  4. Bronchiectasis without acute exacerbation (HCC) See #5 below.  She is plugged in with pulmonary.  5. Chronic obstructive pulmonary disease, unspecified COPD type (HCC) Stable.  Continue Breztri  inhaler Advised to wash mouth with warm water after each use.  6. Dermatitis of lower extremity Almost appears shingles-like but doubt it would have lasted for over 2 months.  She is also up-to-date with her shingles  vaccine.  I recommend that she calls her dermatologist office and see if they can get her in much sooner or put her on a waiting list.    Patient was given the opportunity to ask questions.  Patient verbalized understanding of the plan and was able to repeat key elements of the plan.   This documentation was completed using Paediatric nurse.  Any transcriptional errors are unintentional.  Orders Placed This Encounter  Procedures   POCT glucose (manual entry)   POCT glycosylated hemoglobin (Hb A1C)     Requested Prescriptions   Signed Prescriptions Disp Refills   hydrOXYzine  (ATARAX ) 25 MG tablet 90 tablet 1    Sig: Take 1 tablet (25 mg total) by mouth daily as needed for itching.   omeprazole  (PRILOSEC) 20 MG capsule 90 capsule 1    Sig: Take 1 capsule (20 mg total) by mouth daily.   glimepiride  (AMARYL ) 2 MG tablet 90 tablet 3    Sig: Take 1 tablet (2 mg total) by mouth daily before breakfast. For diabetes    Return in about 4 months (around 02/12/2025).  Barnie Louder, MD, FACP     [1]  Current Outpatient Medications  on File Prior to Visit  Medication Sig Dispense Refill   albuterol  (PROVENTIL ) (2.5 MG/3ML) 0.083% nebulizer solution Take 3 mLs (2.5 mg total) by nebulization every 4 (four) hours as needed. 75 mL 12   albuterol  (VENTOLIN  HFA) 108 (90 Base) MCG/ACT inhaler Inhale 2 puffs into the lungs every 6 (six) hours as needed for wheezing or shortness of breath. 54 g 1   baclofen  (LIORESAL ) 10 MG tablet Take 1 tablet (10 mg total) by mouth 2 (two) times daily. 180 each 3   budeson-glycopyrrolate -formoterol  (BREZTRI  AEROSPHERE) 160-9-4.8 MCG/ACT AERO Inhale 2 puffs into the lungs in the morning and at bedtime.     clotrimazole  (MYCELEX ) 10 MG troche Use 1 troche up to 5 times daily as needed 70 Troche 2   famotidine  (PEPCID ) 20 MG tablet Take 1 tablet (20 mg total) by mouth daily after supper. 30 tablet 11   folic acid  (FOLVITE ) 1 MG tablet Take 1 tablet (1 mg total) by mouth daily. 90 tablet 1   furosemide  (LASIX ) 20 MG tablet Take 1 tablet (20 mg total) by mouth daily. 30 tablet 3   gabapentin  (NEURONTIN ) 300 MG capsule Take 1 capsule (300 mg total) by mouth 2 (two) times daily. 60 capsule 2   lactulose  (CHRONULAC ) 10 GM/15ML solution Take 15 mLs (10 g total) by mouth 2 (two) times daily. 2365 mL 1   Magnesium  Glycinate 100 MG CAPS Take 1 capsule by mouth 2 (two) times daily as needed. 60 capsule 2   Multiple Vitamins-Minerals (CENTRUM SILVER PO) Take 1 tablet by mouth daily.     OVER THE COUNTER MEDICATION Apply 1 application  topically 2 (two) times daily as needed (joint pain). Tiger Balm PO     predniSONE  (DELTASONE ) 10 MG tablet Take 1 tablet (10 mg total) by mouth daily with breakfast. 30 tablet 1   spironolactone  (ALDACTONE ) 50 MG tablet Take 1 tablet (50 mg total) by mouth daily. Please keep your upcoming appointment for further refills. Thank you 30 tablet 2   thiamine  (VITAMIN B1) 100 MG tablet Take 1 tablet (100 mg total) by mouth daily. 90 tablet 0   Trolamine Salicylate (ASPERCREME EX) Apply 1  Application topically See admin instructions. 1 application 3-4 times a day as needed for joint pain  Vitamin D , Ergocalciferol , (DRISDOL ) 1.25 MG (50000 UNIT) CAPS capsule Take 1 capsule (50,000 Units total) by mouth every 14 (fourteen) days. 12 capsule 0   No current facility-administered medications on file prior to visit.  [2]  Allergies Allergen Reactions   Penicillins Hives   Metformin  And Related Other (See Comments)    Nausea, diarrhea, dizzy   "

## 2024-10-16 ENCOUNTER — Encounter: Payer: Self-pay | Admitting: Internal Medicine

## 2024-10-19 ENCOUNTER — Other Ambulatory Visit: Payer: Self-pay

## 2024-10-19 MED ORDER — BREZTRI AEROSPHERE 160-9-4.8 MCG/ACT IN AERO
2.0000 | INHALATION_SPRAY | Freq: Two times a day (BID) | RESPIRATORY_TRACT | 11 refills | Status: AC
  Filled 2024-10-19: qty 10.7, 30d supply, fill #0

## 2024-10-20 ENCOUNTER — Other Ambulatory Visit: Payer: Self-pay | Admitting: Internal Medicine

## 2024-10-20 ENCOUNTER — Other Ambulatory Visit: Payer: Self-pay

## 2024-10-20 DIAGNOSIS — Z1231 Encounter for screening mammogram for malignant neoplasm of breast: Secondary | ICD-10-CM

## 2024-10-20 MED ORDER — THIAMINE HCL 100 MG PO TABS
100.0000 mg | ORAL_TABLET | Freq: Every day | ORAL | 1 refills | Status: AC
  Filled 2024-10-20 – 2024-11-09 (×2): qty 90, 90d supply, fill #0

## 2024-10-21 ENCOUNTER — Other Ambulatory Visit: Payer: Self-pay | Admitting: Internal Medicine

## 2024-10-21 ENCOUNTER — Encounter: Payer: Self-pay | Admitting: Internal Medicine

## 2024-10-21 DIAGNOSIS — Z1231 Encounter for screening mammogram for malignant neoplasm of breast: Secondary | ICD-10-CM

## 2024-10-21 DIAGNOSIS — N6489 Other specified disorders of breast: Secondary | ICD-10-CM

## 2024-10-28 ENCOUNTER — Other Ambulatory Visit: Payer: Self-pay

## 2024-10-29 ENCOUNTER — Other Ambulatory Visit: Payer: Self-pay

## 2024-11-09 ENCOUNTER — Other Ambulatory Visit: Payer: Self-pay | Admitting: Internal Medicine

## 2024-11-09 ENCOUNTER — Other Ambulatory Visit: Payer: Self-pay

## 2024-11-09 MED ORDER — FOLIC ACID 1 MG PO TABS
1.0000 mg | ORAL_TABLET | Freq: Every day | ORAL | 1 refills | Status: AC
  Filled 2024-11-09: qty 90, 90d supply, fill #0

## 2024-11-10 ENCOUNTER — Other Ambulatory Visit: Payer: Self-pay | Admitting: Internal Medicine

## 2024-11-10 ENCOUNTER — Encounter

## 2024-11-10 DIAGNOSIS — R252 Cramp and spasm: Secondary | ICD-10-CM

## 2024-11-10 NOTE — Telephone Encounter (Signed)
 Last Fill: 08/06/2024  Next Visit: 12/09/2024  Last Visit: 09/10/2024  Dx: Leg cramping   Current Dose per office note on 09/10/2024: Dose not discussed.  Okay to refill Magnesium  Glycinate ?

## 2024-11-11 ENCOUNTER — Other Ambulatory Visit: Payer: Self-pay

## 2024-11-11 MED ORDER — MAGNESIUM GLYCINATE 100 MG PO CAPS
1.0000 | ORAL_CAPSULE | Freq: Two times a day (BID) | ORAL | 1 refills | Status: AC | PRN
  Filled 2024-11-11: qty 60, 30d supply, fill #0

## 2024-11-19 ENCOUNTER — Encounter

## 2024-12-09 ENCOUNTER — Ambulatory Visit: Admitting: Internal Medicine

## 2025-02-15 ENCOUNTER — Ambulatory Visit: Payer: Self-pay | Admitting: Internal Medicine
